# Patient Record
Sex: Female | Born: 1955 | Race: Black or African American | Hispanic: No | Marital: Single | Smoking: Former smoker
Health system: Southern US, Community
[De-identification: ages and names within clinical notes are randomized; demographics above are authoritative.]

## PROBLEM LIST (undated history)

## (undated) DIAGNOSIS — I5022 Chronic systolic (congestive) heart failure: Secondary | ICD-10-CM

## (undated) DIAGNOSIS — G629 Polyneuropathy, unspecified: Secondary | ICD-10-CM

## (undated) DIAGNOSIS — M545 Low back pain, unspecified: Secondary | ICD-10-CM

## (undated) DIAGNOSIS — I10 Essential (primary) hypertension: Secondary | ICD-10-CM

## (undated) DIAGNOSIS — E559 Vitamin D deficiency, unspecified: Secondary | ICD-10-CM

## (undated) DIAGNOSIS — M21079 Valgus deformity, not elsewhere classified, unspecified ankle: Secondary | ICD-10-CM

## (undated) DIAGNOSIS — M1030 Gout due to renal impairment, unspecified site: Secondary | ICD-10-CM

## (undated) DIAGNOSIS — Z952 Presence of prosthetic heart valve: Secondary | ICD-10-CM

## (undated) DIAGNOSIS — N183 Chronic kidney disease, stage 3 unspecified: Secondary | ICD-10-CM

## (undated) DIAGNOSIS — I6523 Occlusion and stenosis of bilateral carotid arteries: Secondary | ICD-10-CM

## (undated) DIAGNOSIS — I272 Pulmonary hypertension, unspecified: Secondary | ICD-10-CM

## (undated) DIAGNOSIS — E118 Type 2 diabetes mellitus with unspecified complications: Secondary | ICD-10-CM

## (undated) DIAGNOSIS — F32A Depression, unspecified: Secondary | ICD-10-CM

## (undated) DIAGNOSIS — I071 Rheumatic tricuspid insufficiency: Secondary | ICD-10-CM

## (undated) DIAGNOSIS — E871 Hypo-osmolality and hyponatremia: Secondary | ICD-10-CM

## (undated) DIAGNOSIS — M810 Age-related osteoporosis without current pathological fracture: Secondary | ICD-10-CM

## (undated) DIAGNOSIS — E782 Mixed hyperlipidemia: Secondary | ICD-10-CM

## (undated) DIAGNOSIS — G4700 Insomnia, unspecified: Secondary | ICD-10-CM

## (undated) DIAGNOSIS — N289 Disorder of kidney and ureter, unspecified: Secondary | ICD-10-CM

## (undated) DIAGNOSIS — Z860100 Personal history of colon polyps, unspecified: Secondary | ICD-10-CM

## (undated) DIAGNOSIS — I739 Peripheral vascular disease, unspecified: Secondary | ICD-10-CM

## (undated) DIAGNOSIS — M109 Gout, unspecified: Secondary | ICD-10-CM

## (undated) DIAGNOSIS — H8110 Benign paroxysmal vertigo, unspecified ear: Secondary | ICD-10-CM

## (undated) DIAGNOSIS — Z760 Encounter for issue of repeat prescription: Secondary | ICD-10-CM

## (undated) DIAGNOSIS — F329 Major depressive disorder, single episode, unspecified: Secondary | ICD-10-CM

## (undated) DIAGNOSIS — E1165 Type 2 diabetes mellitus with hyperglycemia: Secondary | ICD-10-CM

## (undated) DIAGNOSIS — R0683 Snoring: Secondary | ICD-10-CM

## (undated) DIAGNOSIS — G8929 Other chronic pain: Secondary | ICD-10-CM

## (undated) DIAGNOSIS — K219 Gastro-esophageal reflux disease without esophagitis: Secondary | ICD-10-CM

## (undated) DIAGNOSIS — Z95810 Presence of automatic (implantable) cardiac defibrillator: Secondary | ICD-10-CM

## (undated) DIAGNOSIS — E669 Obesity, unspecified: Secondary | ICD-10-CM

## (undated) DIAGNOSIS — K5281 Eosinophilic gastritis or gastroenteritis: Secondary | ICD-10-CM

## (undated) DIAGNOSIS — IMO0002 Reserved for concepts with insufficient information to code with codable children: Secondary | ICD-10-CM

## (undated) DIAGNOSIS — I509 Heart failure, unspecified: Secondary | ICD-10-CM

## (undated) DIAGNOSIS — F419 Anxiety disorder, unspecified: Secondary | ICD-10-CM

## (undated) DIAGNOSIS — M1990 Unspecified osteoarthritis, unspecified site: Secondary | ICD-10-CM

## (undated) DIAGNOSIS — D649 Anemia, unspecified: Secondary | ICD-10-CM

## (undated) HISTORY — DX: Disorder of kidney and ureter, unspecified: N28.9

## (undated) HISTORY — DX: Peripheral vascular disease, unspecified: I73.9

## (undated) HISTORY — DX: Age-related osteoporosis without current pathological fracture: M81.0

## (undated) HISTORY — DX: Gout, unspecified: M10.9

## (undated) HISTORY — PX: ABDOMINAL HYSTERECTOMY: SHX81

## (undated) HISTORY — DX: Hypo-osmolality and hyponatremia: E87.1

## (undated) HISTORY — DX: Chronic systolic (congestive) heart failure: I50.22

## (undated) HISTORY — DX: Rheumatic tricuspid insufficiency: I07.1

## (undated) HISTORY — DX: Obesity, unspecified: E66.9

## (undated) HISTORY — DX: Encounter for issue of repeat prescription: Z76.0

## (undated) HISTORY — DX: Occlusion and stenosis of bilateral carotid arteries: I65.23

## (undated) HISTORY — DX: Gout due to renal impairment, unspecified site: M10.30

## (undated) HISTORY — DX: Presence of automatic (implantable) cardiac defibrillator: Z95.810

## (undated) HISTORY — DX: Depression, unspecified: F32.A

## (undated) HISTORY — DX: Pulmonary hypertension, unspecified: I27.20

## (undated) HISTORY — DX: Chronic kidney disease, stage 3 (moderate): N18.3

## (undated) HISTORY — DX: Hypocalcemia: E83.51

## (undated) HISTORY — DX: Mixed hyperlipidemia: E78.2

## (undated) HISTORY — DX: Presence of prosthetic heart valve: Z95.2

## (undated) HISTORY — DX: Gastro-esophageal reflux disease without esophagitis: K21.9

## (undated) HISTORY — DX: Chronic kidney disease, stage 3 unspecified: N18.30

## (undated) HISTORY — DX: Major depressive disorder, single episode, unspecified: F32.9

## (undated) HISTORY — DX: Type 2 diabetes mellitus with hyperglycemia: E11.65

## (undated) HISTORY — DX: Low back pain: M54.5

## (undated) HISTORY — DX: Polyneuropathy, unspecified: G62.9

## (undated) HISTORY — DX: Benign paroxysmal vertigo, unspecified ear: H81.10

## (undated) HISTORY — DX: Eosinophilic gastritis or gastroenteritis: K52.81

## (undated) HISTORY — DX: Low back pain, unspecified: M54.50

## (undated) HISTORY — DX: Valgus deformity, not elsewhere classified, unspecified ankle: M21.079

## (undated) HISTORY — PX: BACK SURGERY: SHX140

## (undated) HISTORY — DX: Type 2 diabetes mellitus with unspecified complications: E11.8

## (undated) HISTORY — DX: Vitamin D deficiency, unspecified: E55.9

## (undated) HISTORY — PX: CARDIAC VALVE REPLACEMENT: SHX585

## (undated) HISTORY — DX: Personal history of colonic polyps: Z86.010

## (undated) HISTORY — DX: Insomnia, unspecified: G47.00

## (undated) HISTORY — DX: Snoring: R06.83

## (undated) HISTORY — DX: Other chronic pain: G89.29

## (undated) HISTORY — DX: Reserved for concepts with insufficient information to code with codable children: IMO0002

## (undated) HISTORY — DX: Personal history of colon polyps, unspecified: Z86.0100

---

## 2011-02-04 ENCOUNTER — Emergency Department (INDEPENDENT_AMBULATORY_CARE_PROVIDER_SITE_OTHER): Payer: Medicaid Other

## 2011-02-04 ENCOUNTER — Other Ambulatory Visit: Payer: Self-pay

## 2011-02-04 ENCOUNTER — Encounter: Payer: Self-pay | Admitting: Emergency Medicine

## 2011-02-04 ENCOUNTER — Emergency Department (HOSPITAL_BASED_OUTPATIENT_CLINIC_OR_DEPARTMENT_OTHER)
Admission: EM | Admit: 2011-02-04 | Discharge: 2011-02-04 | Disposition: A | Discharge status: Home / Self Care | Payer: Medicaid Other | Attending: Emergency Medicine | Admitting: Emergency Medicine

## 2011-02-04 DIAGNOSIS — I10 Essential (primary) hypertension: Secondary | ICD-10-CM | POA: Insufficient documentation

## 2011-02-04 DIAGNOSIS — R0602 Shortness of breath: Secondary | ICD-10-CM | POA: Insufficient documentation

## 2011-02-04 DIAGNOSIS — R05 Cough: Secondary | ICD-10-CM

## 2011-02-04 DIAGNOSIS — R509 Fever, unspecified: Secondary | ICD-10-CM

## 2011-02-04 DIAGNOSIS — I517 Cardiomegaly: Secondary | ICD-10-CM

## 2011-02-04 DIAGNOSIS — J40 Bronchitis, not specified as acute or chronic: Secondary | ICD-10-CM

## 2011-02-04 DIAGNOSIS — E119 Type 2 diabetes mellitus without complications: Secondary | ICD-10-CM | POA: Insufficient documentation

## 2011-02-04 DIAGNOSIS — Z79899 Other long term (current) drug therapy: Secondary | ICD-10-CM | POA: Insufficient documentation

## 2011-02-04 DIAGNOSIS — R059 Cough, unspecified: Secondary | ICD-10-CM

## 2011-02-04 HISTORY — DX: Essential (primary) hypertension: I10

## 2011-02-04 LAB — DIFFERENTIAL
Basophils Absolute: 0 10*3/uL (ref 0.0–0.1)
Basophils Relative: 1 % (ref 0–1)
Eosinophils Absolute: 0.4 10*3/uL (ref 0.0–0.7)
Monocytes Relative: 10 % (ref 3–12)
Neutro Abs: 2.9 10*3/uL (ref 1.7–7.7)
Neutrophils Relative %: 46 % (ref 43–77)

## 2011-02-04 LAB — CK TOTAL AND CKMB (NOT AT ARMC)
CK, MB: 3.1 ng/mL (ref 0.3–4.0)
Relative Index: 3 — ABNORMAL HIGH (ref 0.0–2.5)

## 2011-02-04 LAB — CBC
MCH: 30.8 pg (ref 26.0–34.0)
MCHC: 33 g/dL (ref 30.0–36.0)
Platelets: 277 10*3/uL (ref 150–400)
RDW: 13.1 % (ref 11.5–15.5)

## 2011-02-04 LAB — PRO B NATRIURETIC PEPTIDE: Pro B Natriuretic peptide (BNP): 219.4 pg/mL — ABNORMAL HIGH (ref 0–125)

## 2011-02-04 MED ORDER — AZITHROMYCIN 250 MG PO TABS: 250.0000 mg | ORAL_TABLET | Freq: Every day | ORAL | Status: AC

## 2011-02-04 MED ORDER — ALBUTEROL SULFATE (5 MG/ML) 0.5% IN NEBU
INHALATION_SOLUTION | RESPIRATORY_TRACT | Status: AC
  Filled 2011-02-04: qty 0.5

## 2011-02-04 MED ORDER — ALBUTEROL SULFATE (5 MG/ML) 0.5% IN NEBU
2.5000 mg | INHALATION_SOLUTION | Freq: Once | RESPIRATORY_TRACT | Status: AC
  Administered 2011-02-04: 2.5 mg via RESPIRATORY_TRACT
  Filled 2011-02-04: qty 0.5

## 2011-02-04 MED ORDER — SODIUM CHLORIDE 0.9 % IV SOLN: Freq: Once | INTRAVENOUS | Status: DC

## 2011-02-04 MED ORDER — KETOROLAC TROMETHAMINE 30 MG/ML IJ SOLN
30.0000 mg | Freq: Once | INTRAMUSCULAR | Status: AC
  Administered 2011-02-04: 30 mg via INTRAVENOUS
  Filled 2011-02-04: qty 1

## 2011-02-04 NOTE — ED Notes (Signed)
Care plan and follow up reviewed

## 2011-02-04 NOTE — ED Provider Notes (Signed)
History     CSN: [id number] Arrival date & time: 02/04/2011  8:58 AM   First MD Initiated Contact with Patient 02/04/11 [phone]      Chief Complaint  Patient presents with   Nasal Congestion    cough cold congestion with yellow sputum    (Consider location/radiation/quality/duration/timing/severity/associated sxs/prior treatment) HPI Comments: Patient states that she has been having shortness of breath, productive cough, fatigue for the past several weeks.  She has already been on two antibiotics but not improving.  Denies fevers or chills.    Patient is a 55 y.o. female presenting with shortness of breath. The history is provided by the patient.  Shortness of Breath  The current episode started more than 2 weeks ago. The problem occurs continuously. The problem has been gradually worsening. The problem is moderate. The symptoms are relieved by nothing. The symptoms are aggravated by nothing. Associated symptoms include cough and shortness of breath. Pertinent negatives include no fever. Her past medical history does not include asthma.    Past Medical History  Diagnosis Date   Hypertension    Diabetes mellitus     Past Surgical History  Procedure Date   Cardiac valve replacement    Back surgery    Abdominal hysterectomy     History reviewed. No pertinent family history.  History  Substance Use Topics   Smoking status: Never Smoker    Smokeless tobacco: Not on file   Alcohol Use: No    OB History    Grav Para Term Preterm Abortions TAB SAB Ect Mult Living                  Review of Systems  Constitutional: Positive for fatigue. Negative for fever and chills.  HENT: Negative for neck pain and neck stiffness.   Respiratory: Positive for cough and shortness of breath.   Cardiovascular: Negative for palpitations and leg swelling.  Musculoskeletal: Negative for back pain.  All other systems reviewed and are negative.    Allergies  Review of patient's  allergies indicates no known allergies.  Home Medications   Current Outpatient Rx  Name Route Sig Dispense Refill   ALBUTEROL SULFATE (2.5 MG/3ML) 0.083% IN NEBU Nebulization Take 2.5 mg by nebulization every 6 (six) hours as needed.       AMLODIPINE BESY-BENAZEPRIL HCL 10-20 MG PO CAPS Oral Take 1 capsule by mouth daily.       CARVEDILOL 25 MG PO TABS Oral Take 25 mg by mouth 2 (two) times daily with a meal.       ESTROGENS CONJUGATED 1.25 MG PO TABS Oral Take 1.25 mg by mouth daily.       FENOFIBRATE 145 MG PO TABS Oral Take 145 mg by mouth daily.       FUROSEMIDE 40 MG PO TABS Oral Take 40 mg by mouth 2 (two) times daily.       GABAPENTIN 800 MG PO TABS Oral Take 800 mg by mouth 3 (three) times daily.       INSULIN ISOPHANE HUMAN 100 UNIT/ML [place] SUSP Subcutaneous Inject 70 Units into the skin 2 (two) times daily.       POTASSIUM & SODIUM PHOSPHATES 280-160-250 MG PO PACK Oral Take 1 packet by mouth 4 (four) times daily -  with meals and at bedtime.       SITAGLIPTIN PHOSPHATE 100 MG PO TABS Oral Take 100 mg by mouth daily.       WARFARIN SODIUM 6 MG PO TABS Oral  Take 6 mg by mouth daily.        BP 159/79   Pulse 99   Resp 22   Wt 260 lb (117.935 kg)   SpO2 97%  Physical Exam  Nursing note and vitals reviewed. Constitutional: She is oriented to person, place, and time. She appears well-developed and well-nourished. No distress.  HENT:  Head: Normocephalic and atraumatic.  Neck: Normal range of motion. Neck supple.  Cardiovascular: Normal rate and regular rhythm.  Exam reveals no gallop and no friction rub.   No murmur heard. Pulmonary/Chest: Effort normal and breath sounds normal. No respiratory distress. She has no wheezes.  Abdominal: Soft. Bowel sounds are normal. She exhibits no distension. There is no tenderness.  Musculoskeletal: Normal range of motion.  Neurological: She is alert and oriented to person, place, and time.  Skin: Skin is warm and dry. She is not  diaphoretic.    ED Course  Procedures (including critical care time)   Labs Reviewed  CBC  DIFFERENTIAL  CK TOTAL AND CKMB  TROPONIN I  PRO B NATRIURETIC PEPTIDE   No results found.   No diagnosis found.   Date: 02/04/2011  Rate: 93  Rhythm: normal sinus rhythm  QRS Axis: left  Intervals: normal  ST/T Wave abnormalities: normal  Conduction Disutrbances:nonspecific intraventricular conduction delay  Narrative Interpretation:   Old EKG Reviewed: none available    MDM  Cardiac workup looks okay.  CXR shows bronchial thickening.  No hypoxia.  Will treat with antibiotics, follow up with pcp.          Geoffery Lyons, MD 02/04/11 1116

## 2011-02-04 NOTE — ED Notes (Signed)
Pt reports cough cold and congestion unresponsive to Antibiotic therapy

## 2011-02-08 NOTE — Procedures (Signed)
°

## 2011-02-14 ENCOUNTER — Emergency Department (HOSPITAL_BASED_OUTPATIENT_CLINIC_OR_DEPARTMENT_OTHER)
Admission: EM | Admit: 2011-02-14 | Discharge: 2011-02-14 | Disposition: A | Discharge status: Home / Self Care | Payer: Medicaid Other | Attending: Emergency Medicine | Admitting: Emergency Medicine

## 2011-02-14 ENCOUNTER — Emergency Department (INDEPENDENT_AMBULATORY_CARE_PROVIDER_SITE_OTHER): Payer: Medicaid Other

## 2011-02-14 ENCOUNTER — Encounter (HOSPITAL_BASED_OUTPATIENT_CLINIC_OR_DEPARTMENT_OTHER): Payer: Self-pay | Admitting: *Deleted

## 2011-02-14 DIAGNOSIS — W1809XA Striking against other object with subsequent fall, initial encounter: Secondary | ICD-10-CM

## 2011-02-14 DIAGNOSIS — Y92009 Unspecified place in unspecified non-institutional (private) residence as the place of occurrence of the external cause: Secondary | ICD-10-CM | POA: Insufficient documentation

## 2011-02-14 DIAGNOSIS — I10 Essential (primary) hypertension: Secondary | ICD-10-CM | POA: Insufficient documentation

## 2011-02-14 DIAGNOSIS — R42 Dizziness and giddiness: Secondary | ICD-10-CM

## 2011-02-14 DIAGNOSIS — M25519 Pain in unspecified shoulder: Secondary | ICD-10-CM

## 2011-02-14 DIAGNOSIS — M542 Cervicalgia: Secondary | ICD-10-CM

## 2011-02-14 DIAGNOSIS — Z79899 Other long term (current) drug therapy: Secondary | ICD-10-CM | POA: Insufficient documentation

## 2011-02-14 DIAGNOSIS — S0990XA Unspecified injury of head, initial encounter: Secondary | ICD-10-CM

## 2011-02-14 DIAGNOSIS — W1811XA Fall from or off toilet without subsequent striking against object, initial encounter: Secondary | ICD-10-CM | POA: Insufficient documentation

## 2011-02-14 DIAGNOSIS — E119 Type 2 diabetes mellitus without complications: Secondary | ICD-10-CM | POA: Insufficient documentation

## 2011-02-14 HISTORY — DX: Unspecified osteoarthritis, unspecified site: M19.90

## 2011-02-14 LAB — PROTIME-INR: Prothrombin Time: 31.9 seconds — ABNORMAL HIGH (ref 11.6–15.2)

## 2011-02-14 MED ORDER — OXYCODONE-ACETAMINOPHEN 7.5-325 MG PO TABS: 1.0000 | ORAL_TABLET | ORAL | Status: AC | PRN

## 2011-02-14 NOTE — ED Provider Notes (Signed)
I saw and evaluated the patient, reviewed the resident's note and I agree with the findings and plan.   .Face to face Exam:  General:  Awake HEENT:  Atraumatic Resp:  Normal effort Abd:  Nondistended Neuro:No focal weakness Lymph: No adenopathy   Nelia Shi, MD 02/14/11 1504

## 2011-02-14 NOTE — ED Notes (Signed)
Pt amb to room 7 with quick steady gait in nad. Pt reports falling asleep on the toilet Sunday and hit her head on the bathub. Denies any loc, denies feeling dizzy prior, states "I just fell asleep sitting there..." pt denies any n/v, or other c/o. Cc is lateral neck and bilateral shoulder pain.

## 2011-02-14 NOTE — ED Provider Notes (Signed)
History     CSN: [id number] Arrival date & time: 02/14/2011  8:28 AM   First MD Initiated Contact with Patient 02/14/11 [phone]      No chief complaint on file.   (Consider location/radiation/quality/duration/timing/severity/associated sxs/prior treatment) HPI 55 year old woman with a history of aortic valve replacement on Coumadin, diabetes, and hypertension, last in the ED on 11/26 for shortness of breath and cough who presents today after hitting her head on the bathtub. Patient reports that on Sunday (4 days ago) she was on the toilet and fell asleep, and slouched off, hitting her head on the bathtub. The patient now complains of frontal headache, posterior neck pain, and bilateral shoulder pain. Her left shoulder hurts worse than her right, and she did fall onto her left side. She denies loss of consciousness, numbness or tingling in her limbs, changes in vision, chest pain, palpitations, abdominal pain, or dysuria.  Past Medical History  Diagnosis Date   Hypertension    Diabetes mellitus     Past Surgical History  Procedure Date   Cardiac valve replacement    Back surgery    Abdominal hysterectomy     No family history on file.  History  Substance Use Topics   Smoking status: Never Smoker    Smokeless tobacco: Not on file   Alcohol Use: No    OB History    Grav Para Term Preterm Abortions TAB SAB Ect Mult Living                  Review of Systems As per HPI  Allergies  Review of patient's allergies indicates no known allergies.  Home Medications   Current Outpatient Rx  Name Route Sig Dispense Refill   ALBUTEROL SULFATE (2.5 MG/3ML) 0.083% IN NEBU Nebulization Take 2.5 mg by nebulization every 6 (six) hours as needed.       AMLODIPINE BESY-BENAZEPRIL HCL 10-20 MG PO CAPS Oral Take 1 capsule by mouth daily.       CARVEDILOL 25 MG PO TABS Oral Take 25 mg by mouth 2 (two) times daily with a meal.       ESTROGENS CONJUGATED 1.25 MG PO TABS Oral Take  1.25 mg by mouth daily.       FENOFIBRATE 145 MG PO TABS Oral Take 145 mg by mouth daily.       FUROSEMIDE 40 MG PO TABS Oral Take 40 mg by mouth 2 (two) times daily.       GABAPENTIN 800 MG PO TABS Oral Take 800 mg by mouth 3 (three) times daily.       INSULIN ISOPHANE HUMAN 100 UNIT/ML [place] SUSP Subcutaneous Inject 70 Units into the skin 2 (two) times daily.       POTASSIUM & SODIUM PHOSPHATES 280-160-250 MG PO PACK Oral Take 1 packet by mouth 4 (four) times daily -  with meals and at bedtime.       SITAGLIPTIN PHOSPHATE 100 MG PO TABS Oral Take 100 mg by mouth daily.       WARFARIN SODIUM 6 MG PO TABS Oral Take 6 mg by mouth daily.        BP 156/79   Pulse 86   Temp(Src) 98.3 F (36.8 C) (Oral)   Resp 16   SpO2 98%  Physical Exam VItal signs reviewed and stable. GEN: No apparent distress.  Alert and oriented x 3.  Pleasant, conversant, and cooperative to exam. HEENT: [place], healing 4mm superficial laceration over the bridge of the nose.  Neck  is TTP midline over lower C-spine, no palpable stepoffs or abnormalities.  EOMI.  PERRLA.  Sclerae anicteric.   Mucous membranes are moist.  Oropharynx is without erythema, exudates, or other abnormal lesions. RESP:  Lungs are clear to ascultation bilaterally with good air movement.  No wheezes, ronchi, or rubs. CARDIOVASCULAR: regular rate, normal rhythm.  Clear S1, S2, 3/6 hsm over upper sternal border ABDOMEN: obese, NT EXT: warm and dry.  1+ edema in b/l lower extremeties to mid calf. SKIN: warm and dry with normal turgor. NEURO: CN II-XII grossly intact.  Muscle strength +5/5 in bilateral upper and lower extremities.  Sensation is grossly intact.  No focal deficit.  ED Course  Procedures (including critical care time)   Labs Reviewed  PROTIME-INR   No results found.   No diagnosis found.    MDM  55 year old woman status post fall onto left face and shoulder. Patient does not give history consistent with syncope.  Neuro exam is  nonfocal, but the patient says that the pain is not resolving, and that is why she came to the ED today. In the setting of Coumadin use, will proceed with head and neck CT as well as shoulder x-ray.  10:46 AM CT scan of the head is negative for acute bleed, CT scan of the neck shows no acute pathology. Plain film of the left shoulder also unremarkable. Patient's INR was 3.03, I instructed her to decrease 1 days Coumadin from 7 mg to 5 mg. She'll be discharged with some pain medicine and followup with her primary doctor.      Kathreen Cosier, MD 02/14/11 1047

## 2011-02-25 ENCOUNTER — Emergency Department (HOSPITAL_BASED_OUTPATIENT_CLINIC_OR_DEPARTMENT_OTHER)
Admission: EM | Admit: 2011-02-25 | Discharge: 2011-02-26 | Disposition: A | Discharge status: Home / Self Care | Payer: Medicaid Other | Attending: Emergency Medicine | Admitting: Emergency Medicine

## 2011-02-25 ENCOUNTER — Encounter (HOSPITAL_BASED_OUTPATIENT_CLINIC_OR_DEPARTMENT_OTHER): Payer: Self-pay | Admitting: *Deleted

## 2011-02-25 DIAGNOSIS — Z79899 Other long term (current) drug therapy: Secondary | ICD-10-CM | POA: Insufficient documentation

## 2011-02-25 DIAGNOSIS — L0291 Cutaneous abscess, unspecified: Secondary | ICD-10-CM

## 2011-02-25 DIAGNOSIS — L02219 Cutaneous abscess of trunk, unspecified: Secondary | ICD-10-CM | POA: Insufficient documentation

## 2011-02-25 MED ORDER — LIDOCAINE-EPINEPHRINE 2 %-1:100000 IJ SOLN
INTRAMUSCULAR | Status: AC
  Filled 2011-02-25: qty 1

## 2011-02-25 MED ORDER — CLINDAMYCIN HCL 150 MG PO CAPS: 150.0000 mg | ORAL_CAPSULE | Freq: Four times a day (QID) | ORAL | Status: AC

## 2011-02-25 MED ORDER — HYDROCODONE-ACETAMINOPHEN 5-500 MG PO TABS: 1.0000 | ORAL_TABLET | Freq: Four times a day (QID) | ORAL | Status: AC | PRN

## 2011-02-25 NOTE — ED Provider Notes (Signed)
History     CSN: [id number] Arrival date & time: 02/25/2011 11:10 PM   First MD Initiated Contact with Patient 02/25/11 2339      Chief Complaint  Patient presents with   Abscess    (Consider location/radiation/quality/duration/timing/severity/associated sxs/prior treatment) HPI Comments: Pt states that the ara busted and now the area seems to be getting more swollen  Patient is a 55 y.o. female presenting with abscess. The history is provided by the patient. No language interpreter was used.  Abscess  This is a new problem. The current episode started less than one week ago. The onset was sudden. The problem occurs continuously. The problem has been unchanged. The abscess is present on the groin. The problem is moderate. The abscess is characterized by draining and painfulness. It is unknown what she was exposed to. Her past medical history does not include skin abscesses in family. There were no sick contacts. She has received no recent medical care.    Past Medical History  Diagnosis Date   Hypertension    Diabetes mellitus    Arthritis     Past Surgical History  Procedure Date   Cardiac valve replacement    Back surgery    Abdominal hysterectomy     History reviewed. No pertinent family history.  History  Substance Use Topics   Smoking status: Never Smoker    Smokeless tobacco: Not on file   Alcohol Use: No    OB History    Grav Para Term Preterm Abortions TAB SAB Ect Mult Living                  Review of Systems  All other systems reviewed and are negative.    Allergies  Review of patient's allergies indicates no known allergies.  Home Medications   Current Outpatient Rx  Name Route Sig Dispense Refill   ACETAMINOPHEN 500 MG PO TABS Oral Take 500 mg by mouth every 6 (six) hours as needed. For pain      ALBUTEROL SULFATE HFA 108 (90 BASE) MCG/ACT IN AERS Inhalation Inhale 2 puffs into the lungs every 6 (six) hours as needed. For  wheezing       ALBUTEROL SULFATE (2.5 MG/3ML) 0.083% IN NEBU Nebulization Take 2.5 mg by nebulization every 6 (six) hours as needed. For wheezing      ALPRAZOLAM 0.5 MG PO TABS Oral Take 0.5 mg by mouth 2 (two) times daily as needed. For sleep or anxiety     AMLODIPINE BESY-BENAZEPRIL HCL 10-20 MG PO CAPS Oral Take 1 capsule by mouth daily.       CARVEDILOL 25 MG PO TABS Oral Take 25 mg by mouth 2 (two) times daily.      ESTROGENS CONJUGATED 1.25 MG PO TABS Oral Take 1.25 mg by mouth daily.       FENOFIBRATE 145 MG PO TABS Oral Take 145 mg by mouth daily.       FUROSEMIDE 40 MG PO TABS Oral Take 40 mg by mouth daily.      GABAPENTIN 800 MG PO TABS Oral Take 800 mg by mouth 3 (three) times daily.       INSULIN ISOPHANE & REGULAR (70-30) 100 UNIT/ML [place] SUSP Subcutaneous Inject 75 Units into the skin 2 (two) times daily.       NORTRIPTYLINE HCL 50 MG PO CAPS Oral Take 100 mg by mouth at bedtime.      OMEPRAZOLE 20 MG PO CPDR Oral Take 20 mg by mouth daily.  POTASSIUM CHLORIDE CRYS CR 20 MEQ PO TBCR Oral Take 20 mEq by mouth daily.       SERTRALINE HCL 100 MG PO TABS Oral Take 100 mg by mouth daily.       SITAGLIPTIN PHOSPHATE 100 MG PO TABS Oral Take 100 mg by mouth daily.       WARFARIN SODIUM 1 MG PO TABS Oral Take 1 mg by mouth every other day. Along with 5mg  to equal 6mg       WARFARIN SODIUM 1 MG PO TABS Oral Take 1 mg by mouth every other day. Along with 5mg  to equal 7mg       WARFARIN SODIUM 5 MG PO TABS Oral Take 5 mg by mouth daily. Along with 1mg  tablet to equal 6 or 7mg       OXYCODONE-ACETAMINOPHEN 7.5-325 MG PO TABS Oral Take 1 tablet by mouth every 4 (four) hours as needed for pain. 20 tablet 0    BP 136/80   Pulse 108   Temp(Src) 97.6 F (36.4 C) (Oral)   Resp 20   Ht 6' (1.829 m)   Wt 250 lb (113.399 kg)   BMI 33.91 kg/m2   SpO2 95%  Physical Exam  Nursing note and vitals reviewed. Constitutional: She is oriented to person, place, and time. She appears  well-developed and well-nourished.  Cardiovascular: Normal rate and regular rhythm.   Pulmonary/Chest: Effort normal and breath sounds normal.  Musculoskeletal: Normal range of motion.  Neurological: She is alert and oriented to person, place, and time.  Skin:       Pt has a draining abscess to the right groin:no labial involvement noted    ED Course  Procedures (including critical care time)  Labs Reviewed - No data to display No results found.   1. Abscess       MDM  Area is already draining at this time:no need for I&D at this time:will treat with antibiotics        Teressa Lower, NP 02/25/11 2352  Medical screening examination/treatment/procedure(s) were performed by non-physician practitioner and as supervising physician I was immediately available for consultation/collaboration.   Sunnie Nielsen, MD 02/26/11 [phone]

## 2011-02-25 NOTE — Consent Form (Signed)
°

## 2011-02-25 NOTE — ED Notes (Signed)
Pt states she has had a recurrent "boil" in her groin area. This last time 4-5 days ago. "Busted", but is now bigger.

## 2011-04-15 ENCOUNTER — Encounter (HOSPITAL_BASED_OUTPATIENT_CLINIC_OR_DEPARTMENT_OTHER): Payer: Self-pay | Admitting: *Deleted

## 2011-04-15 ENCOUNTER — Emergency Department (HOSPITAL_BASED_OUTPATIENT_CLINIC_OR_DEPARTMENT_OTHER)
Admission: EM | Admit: 2011-04-15 | Discharge: 2011-04-15 | Disposition: A | Discharge status: Home / Self Care | Payer: Medicaid Other | Attending: Emergency Medicine | Admitting: Emergency Medicine

## 2011-04-15 DIAGNOSIS — E7800 Pure hypercholesterolemia, unspecified: Secondary | ICD-10-CM | POA: Insufficient documentation

## 2011-04-15 DIAGNOSIS — Z79899 Other long term (current) drug therapy: Secondary | ICD-10-CM | POA: Insufficient documentation

## 2011-04-15 DIAGNOSIS — J020 Streptococcal pharyngitis: Secondary | ICD-10-CM | POA: Insufficient documentation

## 2011-04-15 DIAGNOSIS — E119 Type 2 diabetes mellitus without complications: Secondary | ICD-10-CM | POA: Insufficient documentation

## 2011-04-15 DIAGNOSIS — H9209 Otalgia, unspecified ear: Secondary | ICD-10-CM | POA: Insufficient documentation

## 2011-04-15 DIAGNOSIS — I10 Essential (primary) hypertension: Secondary | ICD-10-CM | POA: Insufficient documentation

## 2011-04-15 HISTORY — DX: Anxiety disorder, unspecified: F41.9

## 2011-04-15 LAB — GLUCOSE, CAPILLARY: Glucose-Capillary: 152 mg/dL — ABNORMAL HIGH (ref 70–99)

## 2011-04-15 MED ORDER — ACETAMINOPHEN 325 MG PO TABS
650.0000 mg | ORAL_TABLET | Freq: Once | ORAL | Status: AC
  Administered 2011-04-15: 650 mg via ORAL
  Filled 2011-04-15: qty 2

## 2011-04-15 MED ORDER — PENICILLIN G BENZATHINE 1200000 UNIT/2ML IM SUSP
1.2000 10*6.[IU] | Freq: Once | INTRAMUSCULAR | Status: AC
  Administered 2011-04-15: 1.2 10*6.[IU] via INTRAMUSCULAR
  Filled 2011-04-15: qty 2

## 2011-04-15 NOTE — ED Notes (Signed)
MD at bedside. Teressa Lower, NP in with pt.

## 2011-04-15 NOTE — ED Notes (Signed)
Pt reports sore throat, ear pain and front of neck sore x 1 day

## 2011-04-15 NOTE — ED Provider Notes (Signed)
History     CSN: [id number]  Arrival date & time 04/15/11  1636   First MD Initiated Contact with Patient 04/15/11 1751      Chief Complaint  Patient presents with   Sore Throat   Otalgia    (Consider location/radiation/quality/duration/timing/severity/associated sxs/prior treatment) Patient is a 56 y.o. female presenting with pharyngitis and ear pain. The history is provided by the patient. No language interpreter was used.  Sore Throat This is a new problem. The current episode started today. The problem occurs constantly. The problem has been unchanged. Associated symptoms include coughing and a sore throat. Pertinent negatives include no fever. The symptoms are aggravated by swallowing. She has tried nothing for the symptoms.  Otalgia Associated symptoms include sore throat and cough.    Past Medical History  Diagnosis Date   Hypertension    Diabetes mellitus    Arthritis    High cholesterol    Blood transfusion    Anxiety     Past Surgical History  Procedure Date   Cardiac valve replacement    Back surgery    Abdominal hysterectomy     No family history on file.  History  Substance Use Topics   Smoking status: Former Smoker   Smokeless tobacco: Never Used   Alcohol Use: No    OB History    Grav Para Term Preterm Abortions TAB SAB Ect Mult Living                  Review of Systems  Constitutional: Negative for fever.  HENT: Positive for ear pain and sore throat.   Respiratory: Positive for cough.     Allergies  Review of patient's allergies indicates no known allergies.  Home Medications   Current Outpatient Rx  Name Route Sig Dispense Refill   ACETAMINOPHEN 500 MG PO TABS Oral Take 1,000 mg by mouth every 6 (six) hours as needed. For pain     ALBUTEROL SULFATE HFA 108 (90 BASE) MCG/ACT IN AERS Inhalation Inhale 2 puffs into the lungs every 6 (six) hours as needed. For wheezing       ALPRAZOLAM 0.5 MG PO TABS Oral Take 0.5 mg  by mouth 2 (two) times daily as needed. For sleep or anxiety     AMLODIPINE BESY-BENAZEPRIL HCL 10-20 MG PO CAPS Oral Take 1 capsule by mouth daily.       CARVEDILOL 25 MG PO TABS Oral Take 25 mg by mouth 2 (two) times daily.      ESTROGENS CONJUGATED 1.25 MG PO TABS Oral Take 1.25 mg by mouth daily.       FUROSEMIDE 40 MG PO TABS Oral Take 40 mg by mouth daily.      GABAPENTIN 800 MG PO TABS Oral Take 800 mg by mouth 3 (three) times daily.       INSULIN ISOPHANE & REGULAR (70-30) 100 UNIT/ML [place] SUSP Subcutaneous Inject 75 Units into the skin 2 (two) times daily.       NORTRIPTYLINE HCL 50 MG PO CAPS Oral Take 100 mg by mouth at bedtime.      OMEPRAZOLE 20 MG PO CPDR Oral Take 20 mg by mouth daily.       POTASSIUM CHLORIDE CRYS ER 20 MEQ PO TBCR Oral Take 20 mEq by mouth daily.       SERTRALINE HCL 100 MG PO TABS Oral Take 100 mg by mouth daily.       SITAGLIPTIN PHOSPHATE 100 MG PO TABS Oral Take 100 mg  by mouth daily.       WARFARIN SODIUM 1 MG PO TABS Oral Take 1-2 mg by mouth daily. Take two tablets on Monday and Friday in combination with 5 mg tablet for a total of 7 mg. Remaining days of week, take 1 tablet for a total of 6 mg     WARFARIN SODIUM 5 MG PO TABS Oral Take 5 mg by mouth daily. Along with 1mg  tablet to equal 6 or 7mg       BP 159/87   Pulse 119   Temp(Src) 99.8 F (37.7 C) (Oral)   Resp 20   Ht 5\' 10"  (1.778 m)   Wt 260 lb (117.935 kg)   BMI 37.31 kg/m2   SpO2 96%  Physical Exam  Nursing note and vitals reviewed. Constitutional: She is oriented to person, place, and time. She appears well-developed and well-nourished.  HENT:  Head: Normocephalic and atraumatic.  Right Ear: External ear normal.  Left Ear: External ear normal.  Mouth/Throat: Posterior oropharyngeal edema and posterior oropharyngeal erythema present.  Eyes: Conjunctivae and EOM are normal.  Neck: Neck supple.  Cardiovascular: Normal rate and regular rhythm.   Pulmonary/Chest: Effort normal and  breath sounds normal.  Musculoskeletal: Normal range of motion.  Neurological: She is alert and oriented to person, place, and time.  Skin: Skin is warm and dry.  Psychiatric: She has a normal mood and affect.    ED Course  Procedures (including critical care time)  Labs Reviewed  RAPID STREP SCREEN - Abnormal; Notable for the following:    Streptococcus, Group A Screen (Direct) POSITIVE (*)    All other components within normal limits   No results found.   1. Strep pharyngitis       MDM  Pt treated for strep here       Teressa Lower, NP 04/15/11 1859

## 2011-04-15 NOTE — ED Provider Notes (Signed)
Medical screening examination/treatment/procedure(s) were performed by non-physician practitioner and as supervising physician I was immediately available for consultation/collaboration.  Raeford Razor, MD 04/15/11 2203

## 2011-04-15 NOTE — Consent Form (Signed)
°

## 2011-05-15 ENCOUNTER — Encounter (HOSPITAL_BASED_OUTPATIENT_CLINIC_OR_DEPARTMENT_OTHER): Payer: Self-pay | Admitting: *Deleted

## 2011-05-15 ENCOUNTER — Emergency Department (INDEPENDENT_AMBULATORY_CARE_PROVIDER_SITE_OTHER): Payer: Medicaid Other

## 2011-05-15 ENCOUNTER — Emergency Department (HOSPITAL_BASED_OUTPATIENT_CLINIC_OR_DEPARTMENT_OTHER)
Admission: EM | Admit: 2011-05-15 | Discharge: 2011-05-15 | Disposition: A | Discharge status: Home / Self Care | Payer: Medicaid Other | Attending: Emergency Medicine | Admitting: Emergency Medicine

## 2011-05-15 DIAGNOSIS — E119 Type 2 diabetes mellitus without complications: Secondary | ICD-10-CM | POA: Insufficient documentation

## 2011-05-15 DIAGNOSIS — R011 Cardiac murmur, unspecified: Secondary | ICD-10-CM | POA: Insufficient documentation

## 2011-05-15 DIAGNOSIS — M542 Cervicalgia: Secondary | ICD-10-CM

## 2011-05-15 DIAGNOSIS — M79609 Pain in unspecified limb: Secondary | ICD-10-CM

## 2011-05-15 DIAGNOSIS — M1990 Unspecified osteoarthritis, unspecified site: Secondary | ICD-10-CM

## 2011-05-15 DIAGNOSIS — I10 Essential (primary) hypertension: Secondary | ICD-10-CM | POA: Insufficient documentation

## 2011-05-15 DIAGNOSIS — W19XXXA Unspecified fall, initial encounter: Secondary | ICD-10-CM

## 2011-05-15 DIAGNOSIS — N289 Disorder of kidney and ureter, unspecified: Secondary | ICD-10-CM

## 2011-05-15 DIAGNOSIS — Z7901 Long term (current) use of anticoagulants: Secondary | ICD-10-CM | POA: Insufficient documentation

## 2011-05-15 DIAGNOSIS — F411 Generalized anxiety disorder: Secondary | ICD-10-CM | POA: Insufficient documentation

## 2011-05-15 DIAGNOSIS — Z79899 Other long term (current) drug therapy: Secondary | ICD-10-CM | POA: Insufficient documentation

## 2011-05-15 DIAGNOSIS — Z954 Presence of other heart-valve replacement: Secondary | ICD-10-CM | POA: Insufficient documentation

## 2011-05-15 DIAGNOSIS — E7800 Pure hypercholesterolemia, unspecified: Secondary | ICD-10-CM | POA: Insufficient documentation

## 2011-05-15 DIAGNOSIS — M549 Dorsalgia, unspecified: Secondary | ICD-10-CM | POA: Insufficient documentation

## 2011-05-15 DIAGNOSIS — Z794 Long term (current) use of insulin: Secondary | ICD-10-CM | POA: Insufficient documentation

## 2011-05-15 DIAGNOSIS — M129 Arthropathy, unspecified: Secondary | ICD-10-CM | POA: Insufficient documentation

## 2011-05-15 LAB — URINALYSIS, ROUTINE W REFLEX MICROSCOPIC
Bilirubin Urine: NEGATIVE
Hgb urine dipstick: NEGATIVE
Nitrite: NEGATIVE
Specific Gravity, Urine: 1.011 (ref 1.005–1.030)
Urobilinogen, UA: 0.2 mg/dL (ref 0.0–1.0)
pH: 5 (ref 5.0–8.0)

## 2011-05-15 LAB — URINE MICROSCOPIC-ADD ON

## 2011-05-15 LAB — BASIC METABOLIC PANEL
BUN: 33 mg/dL — ABNORMAL HIGH (ref 6–23)
Chloride: 101 mEq/L (ref 96–112)
GFR calc Af Amer: 22 mL/min — ABNORMAL LOW (ref >90)
GFR calc non Af Amer: 19 mL/min — ABNORMAL LOW (ref >90)
Glucose, Bld: 109 mg/dL — ABNORMAL HIGH (ref 70–99)
Potassium: 4.7 mEq/L (ref 3.5–5.1)
Sodium: 135 mEq/L (ref 135–145)

## 2011-05-15 MED ORDER — OXYCODONE-ACETAMINOPHEN 5-325 MG PO TABS
1.0000 | ORAL_TABLET | Freq: Once | ORAL | Status: AC
  Administered 2011-05-15: 1 via ORAL
  Filled 2011-05-15: qty 1

## 2011-05-15 MED ORDER — OXYCODONE-ACETAMINOPHEN 5-325 MG PO TABS: 2.0000 | ORAL_TABLET | ORAL | Status: AC | PRN

## 2011-05-15 NOTE — ED Notes (Signed)
Pt. Reports pain all over and head and neck.   No reports of vomiting or diarrhea.

## 2011-05-15 NOTE — Discharge Instructions (Signed)
Arthritis, Nonspecific Arthritis is inflammation of a joint. This usually means pain, redness, warmth or swelling are present. One or more joints may be involved. There are a number of types of arthritis. Your caregiver may not be able to tell what type of arthritis you have right away. CAUSES  The most common cause of arthritis is the wear and tear on the joint (osteoarthritis). This causes damage to the cartilage, which can break down over time. The knees, hips, back and neck are most often affected by this type of arthritis. Other types of arthritis and common causes of joint pain include:  Sprains and other injuries near the joint. Sometimes minor sprains and injuries cause pain and swelling that develop hours later.   Rheumatoid arthritis. This affects hands, feet and knees. It usually affects both sides of your body at the same time. It is often associated with chronic ailments, fever, weight loss and general weakness.   Crystal arthritis. Gout and pseudo gout can cause occasional acute severe pain, redness and swelling in the foot, ankle, or knee.   Infectious arthritis. Bacteria can get into a joint through a break in overlying skin. This can cause infection of the joint. Bacteria and viruses can also spread through the blood and affect your joints.   Drug, infectious and allergy reactions. Sometimes joints can become mildly painful and slightly swollen with these types of illnesses.  SYMPTOMS   Pain is the main symptom.   Your joint or joints can also be red, swollen and warm or hot to the touch.   You may have a fever with certain types of arthritis, or even feel overall ill.   The joint with arthritis will hurt with movement. Stiffness is present with some types of arthritis.  DIAGNOSIS  Your caregiver will suspect arthritis based on your description of your symptoms and on your exam. Testing may be needed to find the type of arthritis:  Blood and sometimes urine tests.    X-ray tests and sometimes CT or MRI scans.   Removal of fluid from the joint (arthrocentesis) is done to check for bacteria, crystals or other causes. Your caregiver (or a specialist) will numb the area over the joint with a local anesthetic, and use a needle to remove joint fluid for examination. This procedure is only minimally uncomfortable.   Even with these tests, your caregiver may not be able to tell what kind of arthritis you have. Consultation with a specialist (rheumatologist) may be helpful.  TREATMENT  Your caregiver will discuss with you treatment specific to your type of arthritis. If the specific type cannot be determined, then the following general recommendations may apply. Treatment of severe joint pain includes:  Rest.   Elevation.   Anti-inflammatory medication (for example, ibuprofen) may be prescribed. Avoiding activities that cause increased pain.   Only take over-the-counter or prescription medicines for pain and discomfort as recommended by your caregiver.   Cold packs over an inflamed joint may be used for 10 to 15 minutes every hour. Hot packs sometimes feel better, but do not use overnight. Do not use hot packs if you are diabetic without your caregiver's permission.   A cortisone shot into arthritic joints may help reduce pain and swelling.   Any acute arthritis that gets worse over the next 1 to 2 days needs to be looked at to be sure there is no joint infection.  Long-term arthritis treatment involves modifying activities and lifestyle to reduce joint stress jarring. This can  include weight loss. Also, exercise is needed to nourish the joint cartilage and remove waste. This helps keep the muscles around the joint strong. HOME CARE INSTRUCTIONS   Do not take aspirin to relieve pain if gout is suspected. This elevates uric acid levels.   Only take over-the-counter or prescription medicines for pain, discomfort or fever as directed by your caregiver.    Rest the joint as much as possible.   If your joint is swollen, keep it elevated.   Use crutches if the painful joint is in your leg.   Drinking plenty of fluids may help for certain types of arthritis.   Follow your caregiver's dietary instructions.   Try low-impact exercise such as:   Swimming.   Water aerobics.   Biking.   Walking.   Morning stiffness is often relieved by a warm shower.   Put your joints through regular range-of-motion.  SEEK MEDICAL CARE IF:   You do not feel better in 24 hours or are getting worse.   You have side effects to medications, or are not getting better with treatment.  SEEK IMMEDIATE MEDICAL CARE IF:   You have a fever.   You develop severe joint pain, swelling or redness.   Many joints are involved and become painful and swollen.   There is severe back pain and/or leg weakness.   You have loss of bowel or bladder control.  Document Released: 04/05/2004 Document Revised: 02/15/2011 Document Reviewed: 04/21/2008 [facility] Patient Information 2012 [place], [place].Back Exercises Back exercises help treat and prevent back injuries. The goal of back exercises is to increase the strength of your abdominal and back muscles and the flexibility of your back. These exercises should be started when you no longer have back pain. Back exercises include:  Pelvic Tilt. Lie on your back with your knees bent. Tilt your pelvis until the lower part of your back is against the floor. Hold this position 5 to 10 sec and repeat 5 to 10 times.   Knee to Chest. Pull first 1 knee up against your chest and hold for 20 to 30 seconds, repeat this with the other knee, and then both knees. This may be done with the other leg straight or bent, whichever feels better.   Sit-Ups or Curl-Ups. Bend your knees 90 degrees. Start with tilting your pelvis, and do a partial, slow sit-up, lifting your trunk only 30 to 45 degrees off the floor. Take at least 2 to 3 seconds for  each sit-up. Do not do sit-ups with your knees out straight. If partial sit-ups are difficult, simply do the above but with only tightening your abdominal muscles and holding it as directed.   Hip-Lift. Lie on your back with your knees flexed 90 degrees. Push down with your feet and shoulders as you raise your hips a couple inches off the floor; hold for 10 seconds, repeat 5 to 10 times.   Back arches. Lie on your stomach, propping yourself up on bent elbows. Slowly press on your hands, causing an arch in your low back. Repeat 3 to 5 times. Any initial stiffness and discomfort should lessen with repetition over time.   Shoulder-Lifts. Lie face down with arms beside your body. Keep hips and torso pressed to floor as you slowly lift your head and shoulders off the floor.  Do not overdo your exercises, especially in the beginning. Exercises may cause you some mild back discomfort which lasts for a few minutes; however, if the pain is more severe,  or lasts for more than 15 minutes, do not continue exercises until you see your caregiver. Improvement with exercise therapy for back problems is slow.  See your caregivers for assistance with developing a proper back exercise program. Document Released: 04/05/2004 Document Revised: 02/15/2011 Document Reviewed: 02/26/2005 [facility] Patient Information 2012 [place], [place].  Sciatica Sciatica is a weakness and/or changes in sensation (tingling, jolts, hot and cold, numbness) along the path the sciatic nerve travels. Irritation or damage to lumbar nerve roots is often also referred to as lumbar radiculopathy.  Lumbar radiculopathy (Sciatica) is the most common form of this problem. Radiculopathy can occur in any of the nerves coming out of the spinal cord. The problems caused depend on which nerves are involved. The sciatic nerve is the large nerve supplying the branches of nerves going from the hip to the toes. It often causes a numbness or weakness in the skin  and/or muscles that the sciatic nerve serves. It also may cause symptoms (problems) of pain, burning, tingling, or electric shock-like feelings in the path of this nerve. This usually comes from injury to the fibers that make up the sciatic nerve. Some of these symptoms are low back pain and/or unpleasant feelings in the following areas:  From the mid-buttock down the back of the leg to the back of the knee.   And/or the outside of the calf and top of the foot.   And/or behind the inner ankle to the sole of the foot.  CAUSES   Herniated or slipped disc. Discs are the little cushions between the bones in the back.   Pressure by the piriformis muscle in the buttock on the sciatic nerve (Piriformis Syndrome).   Misalignment of the bones in the lower back and buttocks (Sacroiliac Joint Derangement).   Narrowing of the spinal canal that puts pressure on or pinches the fibers that make up the sciatic nerve.   A slipped vertebra that is out of line with those above or beneath it.   Abnormality of the nervous system itself so that nerve fibers do not transmit signals properly, especially to feet and calves (neuropathy).   Tumor (this is rare).  Your caregiver can usually determine the cause of your sciatica and begin the treatment most likely to help you. TREATMENT  Taking over-the-counter painkillers, physical therapy, rest, exercise, spinal manipulation, and injections of anesthetics and/or steroids may be used. Surgery, acupuncture, and Yoga can also be effective. Mind over matter techniques, mental imagery, and changing factors such as your bed, chair, desk height, posture, and activities are other treatments that may be helpful. You and your caregiver can help determine what is best for you. With proper diagnosis, the cause of most sciatica can be identified and removed. Communication and cooperation between your caregiver and you is essential. If you are not successful immediately, do not be  discouraged. With time, a proper treatment can be found that will make you comfortable. HOME CARE INSTRUCTIONS   If the pain is coming from a problem in the back, applying ice to that area for 15 to 20 minutes, 3 to 4 times per day while awake, may be helpful. Put the ice in a plastic bag. Place a towel between the bag of ice and your skin.   You may exercise or perform your usual activities if these do not aggravate your pain, or as suggested by your caregiver.   Only take over-the-counter or prescription medicines for pain, discomfort, or fever as directed by your caregiver.  If your caregiver has given you a follow-up appointment, it is very important to keep that appointment. Not keeping the appointment could result in a chronic or permanent injury, pain, and disability. If there is any problem keeping the appointment, you must call back to this facility for assistance.  SEEK IMMEDIATE MEDICAL CARE IF:   You experience loss of control of bowel or bladder.   You have increasing weakness in the trunk, buttocks, or legs.   There is numbness in any areas from the hip down to the toes.   You have difficulty walking or keeping your balance.   You have any of the above, with fever or forceful vomiting.  Document Released: 02/20/2001 Document Revised: 02/15/2011 Document Reviewed: 10/10/2007 [facility] Patient Information 2012 [place], [place].

## 2011-05-15 NOTE — ED Provider Notes (Signed)
History     CSN: [id number]  Arrival date & time 05/15/11  1417   First MD Initiated Contact with Patient 05/15/11 1437      Chief Complaint  Patient presents with  . Back Pain    Neck and back pain per Pt. for 1 wk.  Pt. reports she has taken tylenol at home with no relief.   This is a pleasant, 56 year old female with multiple past medical problems that include hypertension, diabetes, arthritis. Also, has a history of cardiac valve replacement.  The patient's relates a history of chronic back and neck pain. States that she has a "pinched nerve" in her neck and her back. She has been having ongoing pain over the past week in her low back with some radiation to her legs. Also, has chronic pain in her neck. She states that she thought the commode. This morning and feels she may have exacerbated her neck pain. She's also had joint aches, which have been fairly chronic. She's had no fevers. No nausea, vomiting. No focal weakness. No bowel or bladder incontinence. No saddle anesthesia. She does state that her blood sugars and been in the range of 145 at home.  She's had no vomiting, no weakness. No abdominal or chest pain. No fevers. Denies any sore throat or headache.  She was recently treated for strep throat, but apparently finished her antibiotics. Her vitals were stable in triage. When asked. She feels that her pain is chronic and related to muscle strains. (Consider location/radiation/quality/duration/timing/severity/associated sxs/prior treatment) HPI  Past Medical History  Diagnosis Date  . Hypertension   . Diabetes mellitus   . Arthritis   . High cholesterol   . Blood transfusion   . Anxiety     Past Surgical History  Procedure Date  . Cardiac valve replacement   . Back surgery   . Abdominal hysterectomy     No family history on file.  History  Substance Use Topics  . Smoking status: Former Games developer  . Smokeless tobacco: Never Used  . Alcohol Use: No    OB History     Grav Para Term Preterm Abortions TAB SAB Ect Mult Living                  Review of Systems  All other systems reviewed and are negative.    Allergies  Review of patient's allergies indicates no known allergies.  Home Medications   Current Outpatient Rx  Name Route Sig Dispense Refill  . ACETAMINOPHEN 500 MG PO TABS Oral Take 1,000 mg by mouth every 6 (six) hours as needed. For pain    . ALBUTEROL SULFATE HFA 108 (90 BASE) MCG/ACT IN AERS Inhalation Inhale 2 puffs into the lungs every 6 (six) hours as needed. For wheezing      . ALPRAZOLAM 0.5 MG PO TABS Oral Take 0.5 mg by mouth 2 (two) times daily as needed. For sleep or anxiety    . AMLODIPINE BESY-BENAZEPRIL HCL 10-20 MG PO CAPS Oral Take 1 capsule by mouth daily.      Marland Kitchen CARVEDILOL 25 MG PO TABS Oral Take 25 mg by mouth 2 (two) times daily.     Marland Kitchen ESTROGENS CONJUGATED 1.25 MG PO TABS Oral Take 1.25 mg by mouth daily.      . FUROSEMIDE 40 MG PO TABS Oral Take 40 mg by mouth daily.     Marland Kitchen GABAPENTIN 800 MG PO TABS Oral Take 800 mg by mouth 3 (three) times daily.      Marland Kitchen  INSULIN ISOPHANE & REGULAR (70-30) 100 UNIT/ML [place] SUSP Subcutaneous Inject 75 Units into the skin 2 (two) times daily.      Marland Kitchen NORTRIPTYLINE HCL 50 MG PO CAPS Oral Take 100 mg by mouth at bedtime.     . OMEPRAZOLE 20 MG PO CPDR Oral Take 20 mg by mouth daily.      Marland Kitchen POTASSIUM CHLORIDE CRYS ER 20 MEQ PO TBCR Oral Take 20 mEq by mouth daily.      . SERTRALINE HCL 100 MG PO TABS Oral Take 100 mg by mouth daily.      Marland Kitchen SITAGLIPTIN PHOSPHATE 100 MG PO TABS Oral Take 100 mg by mouth daily.      . WARFARIN SODIUM 1 MG PO TABS Oral Take 1-2 mg by mouth daily. Take two tablets on Monday and Friday in combination with 5 mg tablet for a total of 7 mg. Remaining days of week, take 1 tablet for a total of 6 mg    . WARFARIN SODIUM 5 MG PO TABS Oral Take 5 mg by mouth daily. Along with 1mg  tablet to equal 6 or 7mg       BP 96/58  Pulse 80  Temp(Src) 98 F (36.7 C) (Oral)  Resp  18  Ht 5\' 10"  (1.778 m)  Wt 153 lb (69.4 kg)  BMI 21.95 kg/m2  SpO2 98%  Physical Exam  Nursing note and vitals reviewed. Constitutional: She is oriented to person, place, and time. She appears well-developed and well-nourished. No distress.  HENT:  Head: Normocephalic and atraumatic.  Right Ear: External ear normal.  Left Ear: External ear normal.  Nose: Nose normal.  Mouth/Throat: No oropharyngeal exudate.       No obvious trauma, contusion ecchymoses, swelling or step-off to the forehead. Mucous membranes slightly dry. There is no bleeding or exudates.  Eyes: Conjunctivae and EOM are normal. Pupils are equal, round, and reactive to light. Right eye exhibits no discharge. Left eye exhibits no discharge.  Neck: Neck supple. No JVD present. No tracheal deviation present. No thyromegaly present.       Muscular tenderness to the paracervical muscles. There is no significant spinal tenderness or step off. There is no stiffness. No meningismus.  Cardiovascular: Normal rate and regular rhythm.  Exam reveals no gallop and no friction rub.   Murmur heard.      Clinic style murmur, likely secondary to her valve replacement.  Pulmonary/Chest: Breath sounds normal. No respiratory distress. She has no wheezes. She has no rales. She exhibits no tenderness.  Abdominal: Soft. Bowel sounds are normal. She exhibits no distension and no mass. There is no tenderness. There is no rebound and no guarding.  Musculoskeletal: Normal range of motion. She exhibits tenderness. She exhibits no edema.       Mild tenderness to lower back into knees. There is no redness no swelling. No calf tenderness. No cellulitis. No crepitance. No rash. There is no obvious effusion appreciated.  Lymphadenopathy:    She has no cervical adenopathy.  Neurological: She is alert and oriented to person, place, and time. She has normal reflexes. She displays normal reflexes. No cranial nerve deficit. She exhibits normal muscle tone.  Coordination normal.       Negative Kernig's and Brudzinski sign.  Skin: Skin is warm and dry. No rash noted.  Psychiatric: She has a normal mood and affect.    ED Course  Procedures (including critical care time)   Labs Reviewed  BASIC METABOLIC PANEL  URINALYSIS, ROUTINE W  REFLEX MICROSCOPIC   No results found.   No diagnosis found.    MDM  Pt is seen and examined;  Initial history and physical completed.  Will follow.    Vital signs are normal. Patient is given pain medication as well as oral fluid trial. We'll check a UA, as well as basic metabolic panel. Disposition will be pending reassessment and lab review.      Kersten Salmons A. Patrica Duel, MD 05/15/11 1456

## 2011-05-20 LAB — URINE CULTURE

## 2011-05-20 NOTE — ED Notes (Signed)
Lab called positive urine culture. Chart taken to EDP for review

## 2011-05-21 NOTE — ED Notes (Addendum)
Zyvox 600 mg 1 po BID x 14 days #28 close follow up with PCP when done for repeat ua and culture written by Lyman Bishop.

## 2011-05-22 NOTE — ED Notes (Signed)
Patient informed of positive results after id'd x 2 and informed of need to notify partner to be treated.Rx called to  [facility] [phone]

## 2011-06-17 ENCOUNTER — Emergency Department (INDEPENDENT_AMBULATORY_CARE_PROVIDER_SITE_OTHER): Payer: Medicaid Other

## 2011-06-17 ENCOUNTER — Encounter (HOSPITAL_BASED_OUTPATIENT_CLINIC_OR_DEPARTMENT_OTHER): Payer: Self-pay | Admitting: *Deleted

## 2011-06-17 ENCOUNTER — Emergency Department (HOSPITAL_BASED_OUTPATIENT_CLINIC_OR_DEPARTMENT_OTHER)
Admission: EM | Admit: 2011-06-17 | Discharge: 2011-06-18 | Disposition: A | Discharge status: Home / Self Care | Payer: Medicaid Other | Attending: Emergency Medicine | Admitting: Emergency Medicine

## 2011-06-17 DIAGNOSIS — Z794 Long term (current) use of insulin: Secondary | ICD-10-CM | POA: Insufficient documentation

## 2011-06-17 DIAGNOSIS — E119 Type 2 diabetes mellitus without complications: Secondary | ICD-10-CM | POA: Insufficient documentation

## 2011-06-17 DIAGNOSIS — M542 Cervicalgia: Secondary | ICD-10-CM | POA: Insufficient documentation

## 2011-06-17 DIAGNOSIS — Z79899 Other long term (current) drug therapy: Secondary | ICD-10-CM | POA: Insufficient documentation

## 2011-06-17 DIAGNOSIS — S139XXA Sprain of joints and ligaments of unspecified parts of neck, initial encounter: Secondary | ICD-10-CM | POA: Insufficient documentation

## 2011-06-17 DIAGNOSIS — M129 Arthropathy, unspecified: Secondary | ICD-10-CM | POA: Insufficient documentation

## 2011-06-17 DIAGNOSIS — M549 Dorsalgia, unspecified: Secondary | ICD-10-CM

## 2011-06-17 DIAGNOSIS — S161XXA Strain of muscle, fascia and tendon at neck level, initial encounter: Secondary | ICD-10-CM

## 2011-06-17 DIAGNOSIS — M25519 Pain in unspecified shoulder: Secondary | ICD-10-CM | POA: Insufficient documentation

## 2011-06-17 DIAGNOSIS — I10 Essential (primary) hypertension: Secondary | ICD-10-CM | POA: Insufficient documentation

## 2011-06-17 DIAGNOSIS — Z7901 Long term (current) use of anticoagulants: Secondary | ICD-10-CM | POA: Insufficient documentation

## 2011-06-17 DIAGNOSIS — W010XXA Fall on same level from slipping, tripping and stumbling without subsequent striking against object, initial encounter: Secondary | ICD-10-CM | POA: Insufficient documentation

## 2011-06-17 DIAGNOSIS — K921 Melena: Secondary | ICD-10-CM | POA: Insufficient documentation

## 2011-06-17 DIAGNOSIS — E7800 Pure hypercholesterolemia, unspecified: Secondary | ICD-10-CM | POA: Insufficient documentation

## 2011-06-17 DIAGNOSIS — M545 Low back pain, unspecified: Secondary | ICD-10-CM | POA: Insufficient documentation

## 2011-06-17 MED ORDER — KETOROLAC TROMETHAMINE 60 MG/2ML IM SOLN
60.0000 mg | Freq: Once | INTRAMUSCULAR | Status: AC
  Administered 2011-06-17: 60 mg via INTRAMUSCULAR
  Filled 2011-06-17: qty 2

## 2011-06-17 MED ORDER — HYDROCODONE-ACETAMINOPHEN 5-325 MG PO TABS
2.0000 | ORAL_TABLET | Freq: Once | ORAL | Status: AC
  Administered 2011-06-17: 2 via ORAL
  Filled 2011-06-17: qty 2

## 2011-06-17 NOTE — ED Notes (Addendum)
Pt presents with multiple complaints including "hurting all over" and infection "down there" and left wrist weakness states that she was told she had an infection but wasn't told what kind also states that she has been on percocet 10 for 20 years and can not get anymore filled. and has been out x 3 weeks and was told to come to ED for pain meds

## 2011-06-17 NOTE — ED Provider Notes (Signed)
History   This chart was scribed for Geoffery Lyons, MD by Charolett Bumpers . The patient was seen in room MH06/MH06 and the patient's care was started at 11:16pm.   CSN: [id number]  Arrival date & time 06/17/11  2201   First MD Initiated Contact with Patient 06/17/11 2313      Chief Complaint  Patient presents with  . Muscle Pain    (Consider location/radiation/quality/duration/timing/severity/associated sxs/prior treatment) HPI Donna Hopkins is a 56 y.o. female who presents to the Emergency Department complaining of constant, moderate back, shoulder and neck pain for the past 3 days. Patient states that she lost her balance 3 days ago in the bathroom and fell down. Patient states that she landed on her head. Patient denies LOC. Patient denies incontience. Patient reports blood in her stool a couple of days ago. Patient reports a h/o a "pinched nerve" in her back and neck. Patient states that she has taken Tylenol and has tried to rest with minimal relief.   No PCP  Past Medical History  Diagnosis Date  . Hypertension   . Diabetes mellitus   . Arthritis   . High cholesterol   . Blood transfusion   . Anxiety     Past Surgical History  Procedure Date  . Cardiac valve replacement   . Back surgery   . Abdominal hysterectomy     History reviewed. No pertinent family history.  History  Substance Use Topics  . Smoking status: Former Games developer  . Smokeless tobacco: Never Used  . Alcohol Use: No    OB History    Grav Para Term Preterm Abortions TAB SAB Ect Mult Living                  Review of Systems A complete 10 system review of systems was obtained and all systems are negative except as noted in the HPI and PMH.   Allergies  Review of patient's allergies indicates no known allergies.  Home Medications   Current Outpatient Rx  Name Route Sig Dispense Refill  . ACETAMINOPHEN 500 MG PO TABS Oral Take 1,000 mg by mouth every 6 (six) hours as needed. For pain     . ALBUTEROL SULFATE HFA 108 (90 BASE) MCG/ACT IN AERS Inhalation Inhale 2 puffs into the lungs every 6 (six) hours as needed. For wheezing      . ALPRAZOLAM 0.5 MG PO TABS Oral Take 0.5 mg by mouth 2 (two) times daily as needed. For sleep or anxiety    . AMLODIPINE BESY-BENAZEPRIL HCL 10-20 MG PO CAPS Oral Take 1 capsule by mouth daily.      Marland Kitchen CARVEDILOL 25 MG PO TABS Oral Take 25 mg by mouth daily.     Marland Kitchen ESTROGENS CONJUGATED 1.25 MG PO TABS Oral Take 1.25 mg by mouth daily.      Marland Kitchen FERROUS SULFATE 325 (65 FE) MG PO TABS Oral Take 325 mg by mouth daily with breakfast.    . FUROSEMIDE 40 MG PO TABS Oral Take 40 mg by mouth daily.     Marland Kitchen GABAPENTIN 800 MG PO TABS Oral Take 800 mg by mouth 3 (three) times daily.      . INSULIN ISOPHANE & REGULAR (70-30) 100 UNIT/ML [place] SUSP Subcutaneous Inject 75 Units into the skin 2 (two) times daily.      Marland Kitchen NORTRIPTYLINE HCL 50 MG PO CAPS Oral Take 100 mg by mouth at bedtime.     . OMEPRAZOLE 20 MG PO CPDR Oral Take  20 mg by mouth daily.      Marland Kitchen POTASSIUM CHLORIDE CRYS ER 20 MEQ PO TBCR Oral Take 20 mEq by mouth daily.      Marland Kitchen PRESCRIPTION MEDICATION Oral Take 1 tablet by mouth daily. Kidney medication to help clear suds from urine    . SERTRALINE HCL 100 MG PO TABS Oral Take 100 mg by mouth daily.      Marland Kitchen SITAGLIPTIN PHOSPHATE 100 MG PO TABS Oral Take 100 mg by mouth daily.      . WARFARIN SODIUM 1 MG PO TABS Oral Take 5 mg by mouth once a week. Take 5 mg along with the 5 mg tablet to equal 10 mg on Wednesday    . WARFARIN SODIUM 5 MG PO TABS Oral Take 5 mg by mouth daily. (except on Wednesday take additional 5 mg to equal 10 mg)      BP 119/72  Pulse 87  Temp(Src) 98 F (36.7 C) (Oral)  Resp 18  Ht 5\' 4"  (1.626 m)  Wt 240 lb (108.863 kg)  BMI 41.20 kg/m2  SpO2 98%  Physical Exam  Nursing note and vitals reviewed. Constitutional: She is oriented to person, place, and time. She appears well-developed and well-nourished. No distress.  HENT:  Head:  Normocephalic and atraumatic.  Right Ear: External ear normal.  Left Ear: External ear normal.  Nose: Nose normal.  Eyes: Conjunctivae and EOM are normal. Pupils are equal, round, and reactive to light.  Neck: Normal range of motion. Neck supple. No tracheal deviation present.  Cardiovascular: Normal rate, regular rhythm and normal heart sounds.  Exam reveals no gallop and no friction rub.   No murmur heard. Pulmonary/Chest: Effort normal and breath sounds normal. No respiratory distress. She has no wheezes. She has no rales.  Abdominal: Soft. Bowel sounds are normal. She exhibits no distension. There is no tenderness.  Musculoskeletal: Normal range of motion. She exhibits no edema.       No bony tenderness, no step-offs. Tenderness of soft tissues of cervical and lumbar spine.   Neurological: She is alert and oriented to person, place, and time. She has normal reflexes. No cranial nerve deficit or sensory deficit. Coordination normal.       Strength 5/5 bilaterally.   Skin: Skin is warm and dry.  Psychiatric: She has a normal mood and affect. Her behavior is normal.    ED Course  Procedures (including critical care time)  DIAGNOSTIC STUDIES: Oxygen Saturation is 98% on room air, normal by my interpretation.    COORDINATION OF CARE:  2320: Discussed planned course of treatment with the patient, who is agreeable at this time.    Labs Reviewed - No data to display No results found.   No diagnosis found.    MDM  Xrays show chronic changes, no fractures.  Hopkins discharge with pain meds, time.  Return, follow up prn   I personally performed the services described in this documentation, which was scribed in my presence. The recorded information has been reviewed and considered.        Geoffery Lyons, MD 06/18/11 [phone]

## 2011-06-18 DIAGNOSIS — M5380 Other specified dorsopathies, site unspecified: Secondary | ICD-10-CM

## 2011-06-18 DIAGNOSIS — M542 Cervicalgia: Secondary | ICD-10-CM

## 2011-06-18 DIAGNOSIS — W19XXXA Unspecified fall, initial encounter: Secondary | ICD-10-CM

## 2011-06-18 DIAGNOSIS — M5126 Other intervertebral disc displacement, lumbar region: Secondary | ICD-10-CM

## 2011-06-18 DIAGNOSIS — M5137 Other intervertebral disc degeneration, lumbosacral region: Secondary | ICD-10-CM

## 2011-06-18 MED ORDER — OXYCODONE-ACETAMINOPHEN 5-325 MG PO TABS: 1.0000 | ORAL_TABLET | Freq: Four times a day (QID) | ORAL | Status: AC | PRN

## 2011-06-18 MED ORDER — HYDROCODONE-ACETAMINOPHEN 5-500 MG PO TABS: 1.0000 | ORAL_TABLET | Freq: Four times a day (QID) | ORAL | Status: DC | PRN

## 2011-06-18 NOTE — ED Notes (Signed)
rx x 1 for percocet given at d/c

## 2011-06-18 NOTE — Discharge Instructions (Signed)
Back Pain, Adult Low back pain is very common. About 1 in 5 people have back pain.The cause of low back pain is rarely dangerous. The pain often gets better over time.About half of people with a sudden onset of back pain feel better in just 2 weeks. About 8 in 10 people feel better by 6 weeks.  CAUSES Some common causes of back pain include:  Strain of the muscles or ligaments supporting the spine.   Wear and tear (degeneration) of the spinal discs.   Arthritis.   Direct injury to the back.  DIAGNOSIS Most of the time, the direct cause of low back pain is not known.However, back pain can be treated effectively even when the exact cause of the pain is unknown.Answering your caregiver's questions about your overall health and symptoms is one of the most accurate ways to make sure the cause of your pain is not dangerous. If your caregiver needs more information, he or she may order lab work or imaging tests (X-rays or MRIs).However, even if imaging tests show changes in your back, this usually does not require surgery. HOME CARE INSTRUCTIONS For many people, back pain returns.Since low back pain is rarely dangerous, it is often a condition that people can learn to manageon their own.   Remain active. It is stressful on the back to sit or stand in one place. Do not sit, drive, or stand in one place for more than 30 minutes at a time. Take short walks on level surfaces as soon as pain allows.Try to increase the length of time you walk each day.   Do not stay in bed.Resting more than 1 or 2 days can delay your recovery.   Do not avoid exercise or work.Your body is made to move.It is not dangerous to be active, even though your back may hurt.Your back will likely heal faster if you return to being active before your pain is gone.   Pay attention to your body when you bend and lift. Many people have less discomfortwhen lifting if they bend their knees, keep the load close to their  bodies,and avoid twisting. Often, the most comfortable positions are those that put less stress on your recovering back.   Find a comfortable position to sleep. Use a firm mattress and lie on your side with your knees slightly bent. If you lie on your back, put a pillow under your knees.   Only take over-the-counter or prescription medicines as directed by your caregiver. Over-the-counter medicines to reduce pain and inflammation are often the most helpful.Your caregiver may prescribe muscle relaxant drugs.These medicines help dull your pain so you can more quickly return to your normal activities and healthy exercise.   Put ice on the injured area.   Put ice in a plastic bag.   Place a towel between your skin and the bag.   Leave the ice on for 15 to 20 minutes, 3 to 4 times a day for the first 2 to 3 days. After that, ice and heat may be alternated to reduce pain and spasms.   Ask your caregiver about trying back exercises and gentle massage. This may be of some benefit.   Avoid feeling anxious or stressed.Stress increases muscle tension and can worsen back pain.It is important to recognize when you are anxious or stressed and learn ways to manage it.Exercise is a great option.  SEEK MEDICAL CARE IF:  You have pain that is not relieved with rest or medicine.   You have   pain that does not improve in 1 week.   You have new symptoms.   You are generally not feeling well.  SEEK IMMEDIATE MEDICAL CARE IF:   You have pain that radiates from your back into your legs.   You develop new bowel or bladder control problems.   You have unusual weakness or numbness in your arms or legs.   You develop nausea or vomiting.   You develop abdominal pain.   You feel faint.  Document Released: 02/26/2005 Document Revised: 02/15/2011 Document Reviewed: 07/17/2010 [employer] Patient Information 2012 [employer]. 

## 2011-06-18 NOTE — ED Notes (Signed)
Delo MD at bedside. 

## 2011-07-09 ENCOUNTER — Encounter: Payer: Self-pay | Admitting: Physical Medicine & Rehabilitation

## 2011-07-12 ENCOUNTER — Encounter (HOSPITAL_BASED_OUTPATIENT_CLINIC_OR_DEPARTMENT_OTHER): Payer: Self-pay | Admitting: Family Medicine

## 2011-07-12 ENCOUNTER — Emergency Department (HOSPITAL_BASED_OUTPATIENT_CLINIC_OR_DEPARTMENT_OTHER)
Admission: EM | Admit: 2011-07-12 | Discharge: 2011-07-12 | Disposition: A | Discharge status: Home / Self Care | Payer: Medicaid Other | Attending: Emergency Medicine | Admitting: Emergency Medicine

## 2011-07-12 DIAGNOSIS — E119 Type 2 diabetes mellitus without complications: Secondary | ICD-10-CM | POA: Insufficient documentation

## 2011-07-12 DIAGNOSIS — M549 Dorsalgia, unspecified: Secondary | ICD-10-CM

## 2011-07-12 DIAGNOSIS — E7800 Pure hypercholesterolemia, unspecified: Secondary | ICD-10-CM | POA: Insufficient documentation

## 2011-07-12 DIAGNOSIS — Z794 Long term (current) use of insulin: Secondary | ICD-10-CM | POA: Insufficient documentation

## 2011-07-12 DIAGNOSIS — B379 Candidiasis, unspecified: Secondary | ICD-10-CM

## 2011-07-12 DIAGNOSIS — K0889 Other specified disorders of teeth and supporting structures: Secondary | ICD-10-CM

## 2011-07-12 DIAGNOSIS — I10 Essential (primary) hypertension: Secondary | ICD-10-CM | POA: Insufficient documentation

## 2011-07-12 DIAGNOSIS — B3789 Other sites of candidiasis: Secondary | ICD-10-CM | POA: Insufficient documentation

## 2011-07-12 DIAGNOSIS — F411 Generalized anxiety disorder: Secondary | ICD-10-CM | POA: Insufficient documentation

## 2011-07-12 DIAGNOSIS — Z90710 Acquired absence of both cervix and uterus: Secondary | ICD-10-CM | POA: Insufficient documentation

## 2011-07-12 DIAGNOSIS — Z87891 Personal history of nicotine dependence: Secondary | ICD-10-CM | POA: Insufficient documentation

## 2011-07-12 DIAGNOSIS — G8929 Other chronic pain: Secondary | ICD-10-CM | POA: Insufficient documentation

## 2011-07-12 DIAGNOSIS — M545 Low back pain, unspecified: Secondary | ICD-10-CM | POA: Insufficient documentation

## 2011-07-12 DIAGNOSIS — N61 Mastitis without abscess: Secondary | ICD-10-CM | POA: Insufficient documentation

## 2011-07-12 DIAGNOSIS — M129 Arthropathy, unspecified: Secondary | ICD-10-CM | POA: Insufficient documentation

## 2011-07-12 DIAGNOSIS — Z7901 Long term (current) use of anticoagulants: Secondary | ICD-10-CM | POA: Insufficient documentation

## 2011-07-12 DIAGNOSIS — K089 Disorder of teeth and supporting structures, unspecified: Secondary | ICD-10-CM | POA: Insufficient documentation

## 2011-07-12 MED ORDER — CLOTRIMAZOLE 1 % EX CREA: TOPICAL_CREAM | CUTANEOUS | Status: AC

## 2011-07-12 MED ORDER — OXYCODONE-ACETAMINOPHEN 5-325 MG PO TABS
2.0000 | ORAL_TABLET | Freq: Once | ORAL | Status: AC
  Administered 2011-07-12: 2 via ORAL
  Filled 2011-07-12: qty 2

## 2011-07-12 NOTE — ED Provider Notes (Signed)
History     CSN: [id number]  Arrival date & time 07/12/11  0900   First MD Initiated Contact with Patient 07/12/11 [phone]      Chief Complaint  Patient presents with  . Medication Refill    HPI The patient presents with multiple complaints.  She notes that she is always in some degree of discomfort, and has recently had difficulty maintaining care from a particular physician.  She notes that over the past month her chronic low back pain has become more pronounced, and she has not obtained any refills of her Percocet.  She denies any new falls, trauma, lower extremity dysesthesia, bowel or bladder incontinence.  Over this past month patient also developed pain focally about her right upper posterior teeth.  The pain is described as dull, worse with biting, improved transiently with Percocet.  She denies any new trismus, dysphagia, vomiting, fever. The patient also complains of a right inframammary rash.  She notes that over the past month she initially gradually developed irritation about that area, and to 2 weeks ago was started on a topical antifungal.  She notes that she is out of this medication and continue to have itchiness about the right inframammary area.  She denies any new spread of erythema or dry skin, any new pain in this area.   Past Medical History  Diagnosis Date  . Hypertension   . Diabetes mellitus   . Arthritis   . High cholesterol   . Blood transfusion   . Anxiety     Past Surgical History  Procedure Date  . Cardiac valve replacement   . Back surgery   . Abdominal hysterectomy     No family history on file.  History  Substance Use Topics  . Smoking status: Former Games developer  . Smokeless tobacco: Never Used  . Alcohol Use: No    OB History    Grav Para Term Preterm Abortions TAB SAB Ect Mult Living                  Review of Systems  Constitutional:       HPI  HENT:       HPI otherwise negative  Eyes: Negative.   Respiratory:       HPI, otherwise  negative  Cardiovascular:       HPI, otherwise nmegative  Gastrointestinal: Negative for vomiting.  Genitourinary:       HPI, otherwise negative  Musculoskeletal:       HPI, otherwise negative  Skin: Negative.   Neurological: Negative for syncope.    Allergies  Review of patient's allergies indicates no known allergies.  Home Medications   Current Outpatient Rx  Name Route Sig Dispense Refill  . ACETAMINOPHEN 500 MG PO TABS Oral Take 1,000 mg by mouth every 6 (six) hours as needed. For pain    . ALBUTEROL SULFATE HFA 108 (90 BASE) MCG/ACT IN AERS Inhalation Inhale 2 puffs into the lungs every 6 (six) hours as needed. For wheezing      . ALPRAZOLAM 0.5 MG PO TABS Oral Take 0.5 mg by mouth 2 (two) times daily as needed. For sleep or anxiety    . AMLODIPINE BESY-BENAZEPRIL HCL 10-20 MG PO CAPS Oral Take 1 capsule by mouth daily.      Marland Kitchen CARVEDILOL 25 MG PO TABS Oral Take 25 mg by mouth daily.     Marland Kitchen CLOTRIMAZOLE 1 % EX CREA  Apply to affected area 2 times daily 15 g 0  .  ESTROGENS CONJUGATED 1.25 MG PO TABS Oral Take 1.25 mg by mouth daily.      Marland Kitchen FERROUS SULFATE 325 (65 FE) MG PO TABS Oral Take 325 mg by mouth daily with breakfast.    . FUROSEMIDE 40 MG PO TABS Oral Take 40 mg by mouth daily.     Marland Kitchen GABAPENTIN 800 MG PO TABS Oral Take 800 mg by mouth 3 (three) times daily.      . INSULIN ISOPHANE & REGULAR (70-30) 100 UNIT/ML [place] SUSP Subcutaneous Inject 75 Units into the skin 2 (two) times daily.      Marland Kitchen NORTRIPTYLINE HCL 50 MG PO CAPS Oral Take 100 mg by mouth at bedtime.     . OMEPRAZOLE 20 MG PO CPDR Oral Take 20 mg by mouth daily.      Marland Kitchen POTASSIUM CHLORIDE CRYS ER 20 MEQ PO TBCR Oral Take 20 mEq by mouth daily.      Marland Kitchen PRESCRIPTION MEDICATION Oral Take 1 tablet by mouth daily. Kidney medication to help clear suds from urine    . SERTRALINE HCL 100 MG PO TABS Oral Take 100 mg by mouth daily.      Marland Kitchen SITAGLIPTIN PHOSPHATE 100 MG PO TABS Oral Take 100 mg by mouth daily.      . WARFARIN  SODIUM 1 MG PO TABS Oral Take 5 mg by mouth once a week. Take 5 mg along with the 5 mg tablet to equal 10 mg on Wednesday    . WARFARIN SODIUM 5 MG PO TABS Oral Take 5 mg by mouth daily. (except on Wednesday take additional 5 mg to equal 10 mg)      BP 158/93  Pulse 69  Temp(Src) 97.9 F (36.6 C) (Oral)  Resp 16  SpO2 100%  Physical Exam  Nursing note and vitals reviewed. Constitutional: She is oriented to person, place, and time. She appears well-developed and well-nourished. No distress.  HENT:  Head: Normocephalic and atraumatic. No trismus in the jaw.  Mouth/Throat: Uvula is midline, oropharynx is clear and moist and mucous membranes are normal. No oral lesions. Abnormal dentition. No oropharyngeal exudate, posterior oropharyngeal edema, posterior oropharyngeal erythema or tonsillar abscesses.    Eyes: Conjunctivae and EOM are normal.  Cardiovascular: Normal rate and regular rhythm.   Pulmonary/Chest: Effort normal and breath sounds normal. No stridor. No respiratory distress.    Abdominal: She exhibits no distension.  Musculoskeletal: She exhibits no edema.       Strength is 5/5 in both upper and lower extremities.  There is diffuse mild tenderness to palpation across the lower back without appreciable deformity  Neurological: She is alert and oriented to person, place, and time. No cranial nerve deficit.  Skin: Skin is warm and dry.  Psychiatric: She has a normal mood and affect.    ED Course  Procedures (including critical care time)  Labs Reviewed - No data to display No results found.   1. Chronic back pain   2. Yeast infection   3. Pain, dental       MDM  This elderly appearing female with chronic low back pain now presents with request for pain medication refill and concerns over persistency of her low back pain, as well as other new complaints.  On my exam the patient is in no distress with unremarkable vital signs, no focal neurologic deficits, nor any  evidence of systemic infection.  The patient's teeth are poor, though there is no evidence of dental abscess or infection during the patient  also has a right inframammary yeast infection, which is generally well healing and appearance.  I spent a considerable amount of time discussing the requirement for ongoing management of her chronic pain via primary care and pain management specialists.  The patient was disappointed in the denial of emergency Department provision of narcotic prescriptions.  She is informed of narcotics should from a single provider only.  Although she requested narcotics several times, the patient received only a single dose of oral medication here in the emergency department and was counseled on need to expedite her followup care for continued pain management.       Gerhard Munch, MD 07/12/11 [phone]

## 2011-07-12 NOTE — ED Notes (Signed)
Pt sts she is out of percocet and needs more for chronic back and neck pain. Pt also c/o rash under breast.

## 2011-07-12 NOTE — Discharge Instructions (Signed)
As we discussed, it is very important that you continue to have your condition managed by your primary care physician in the pain management specialists.  If you develop any new, or concerning changes in your condition, please return to the emergency department immediately.

## 2011-07-30 ENCOUNTER — Ambulatory Visit: Payer: Medicaid Other | Admitting: Physical Medicine & Rehabilitation

## 2011-08-02 ENCOUNTER — Encounter: Payer: Self-pay | Admitting: Physical Medicine & Rehabilitation

## 2011-08-02 ENCOUNTER — Ambulatory Visit (HOSPITAL_BASED_OUTPATIENT_CLINIC_OR_DEPARTMENT_OTHER): Payer: Medicaid Other | Admitting: Physical Medicine & Rehabilitation

## 2011-08-02 ENCOUNTER — Encounter: Payer: Medicaid Other | Attending: Physical Medicine & Rehabilitation

## 2011-08-02 VITALS — BP 140/60 | HR 56 | Resp 14 | Ht 69.0 in | Wt 261.0 lb

## 2011-08-02 DIAGNOSIS — M47812 Spondylosis without myelopathy or radiculopathy, cervical region: Secondary | ICD-10-CM | POA: Insufficient documentation

## 2011-08-02 DIAGNOSIS — E1149 Type 2 diabetes mellitus with other diabetic neurological complication: Secondary | ICD-10-CM

## 2011-08-02 DIAGNOSIS — E1142 Type 2 diabetes mellitus with diabetic polyneuropathy: Secondary | ICD-10-CM

## 2011-08-02 DIAGNOSIS — M7062 Trochanteric bursitis, left hip: Secondary | ICD-10-CM | POA: Insufficient documentation

## 2011-08-02 DIAGNOSIS — G5600 Carpal tunnel syndrome, unspecified upper limb: Secondary | ICD-10-CM | POA: Insufficient documentation

## 2011-08-02 DIAGNOSIS — E1140 Type 2 diabetes mellitus with diabetic neuropathy, unspecified: Secondary | ICD-10-CM | POA: Insufficient documentation

## 2011-08-02 DIAGNOSIS — G894 Chronic pain syndrome: Secondary | ICD-10-CM | POA: Insufficient documentation

## 2011-08-02 DIAGNOSIS — M47817 Spondylosis without myelopathy or radiculopathy, lumbosacral region: Secondary | ICD-10-CM

## 2011-08-02 DIAGNOSIS — M542 Cervicalgia: Secondary | ICD-10-CM | POA: Insufficient documentation

## 2011-08-02 DIAGNOSIS — M76899 Other specified enthesopathies of unspecified lower limb, excluding foot: Secondary | ICD-10-CM | POA: Insufficient documentation

## 2011-08-02 DIAGNOSIS — M545 Low back pain, unspecified: Secondary | ICD-10-CM | POA: Insufficient documentation

## 2011-08-02 DIAGNOSIS — M47816 Spondylosis without myelopathy or radiculopathy, lumbar region: Secondary | ICD-10-CM | POA: Insufficient documentation

## 2011-08-02 NOTE — Patient Instructions (Signed)
You had an injection today of your left trochanteric bursa this may help for weeks or months. You'll need to do stretching to keep the pain from coming back again You'll need to use heat up to 30 minutes on your neck and low back and do range of motion exercises. I will give you pamphlets for this In terms your medications because of all the medicines you're on for the various conditions the only safe medication in my opinion would be Tylenol 2 tablets 4 times per day. I recommend aquatic therapy at least 3 times per week. You can go chair local [facility] to do this. In terms of your carpal tunnel syndrome I would recommend a wrist injection we could do this at this clinic and I will schedule you. As we discussed I do not think narcotic medications would be a safe option in your situation, you're free to seek other opinions from other doctors if you wish

## 2011-08-02 NOTE — Progress Notes (Signed)
Subjective:    Patient ID: Donna Hopkins, female    DOB: 1955/09/21, 56 y.o.   MRN: [id number]  HPI  20 year history of back pain and neck pain. No history of trauma. Has been seen by neurology in the past. Has had oxycodone in the past. Was not consenting to urine drug screen or pill counts.the patient states she was not cleared for surgery due to cardiac reasons. Has a documented carpal tunnel syndrome but has not undergone surgery for this or any injections for this. Also complains of left hip pain X-rays of the lumbar spine reviewed it shows moderate arthritis at the L5-S1 facet joints. Mild L3-4 anterolisthesis Neck the mild arthritis in the C4-5-6 levels. Past history significant for diabetes,chronic Coumadin for valvular heart disease, hepatic neuropathy  Pain Inventory Average Pain 9 Pain Right Now 10 My pain is sharp, burning, tingling and aching  In the last 24 hours, has pain interfered with the following? General activity 3 Relation with others 6 Enjoyment of life 3 What TIME of day is your pain at its worst? Morning, Evening, Night Sleep (in general) Poor  Pain is worse with: walking, bending, sitting and standing Pain improves with: rest and medication Relief from Meds: 7  Mobility use a cane how many minutes can you walk? 15 ability to climb steps?  yes do you drive?  yes needs help with transfers  Function disabled: date disabled 75 I need assistance with the following:  meal prep, household duties and shopping  Neuro/Psych bowel control problems weakness tingling spasms depression anxiety  Prior Studies x-rays CT/MRI nerve study  Physicians involved in your care Didn't list any  Review of Systems  Constitutional: Positive for chills, fatigue and unexpected weight change.  HENT: Negative.   Eyes: Negative.   Respiratory: Negative.   Cardiovascular: Negative.   Gastrointestinal: Negative.   Genitourinary: Negative.   Musculoskeletal:  Positive for joint swelling.  Skin: Positive for rash.  Neurological: Positive for weakness.  Hematological: Bruises/bleeds easily.  Psychiatric/Behavioral: Negative.        Objective:   Physical Exam  Constitutional: She is oriented to person, place, and time. She appears well-developed.  Musculoskeletal:       Left hip: She exhibits decreased range of motion and tenderness.       Lumbar back: She exhibits decreased range of motion.       Left hip has tenderness over the trochanteric bursa  Neurological: She is alert and oriented to person, place, and time. She has normal strength. She displays abnormal reflex. No sensory deficit. Gait abnormal.  Reflex Scores:      Tricep reflexes are 1+ on the right side and 1+ on the left side.      Bicep reflexes are 1+ on the right side and 1+ on the left side.      Brachioradialis reflexes are 1+ on the right side and 1+ on the left side.      Patellar reflexes are 1+ on the right side and 1+ on the left side.      Achilles reflexes are 1+ on the right side and 1+ on the left side.      Decreased sensation of pinprick in the feet Ambulation is slow no evidence of foot drag or knee instability  Straight leg raising test is negative Muscle in the lower extremity and distally showing no evidence of atrophy  Psychiatric: Judgment and thought content normal. Her affect is blunt. Her speech is delayed. Cognition and memory  are normal. She is inattentive.          Assessment & Plan:  1. Chronic pain syndrome she has been on narcotic analgesics in the past but was not compliant. She has multiple medical comorbidities limited treatment options. She is not a good candidate for tramadol because of the high-dose nortriptyline. She is not a good candidate for spine injections do to Coumadin for valvular heart disease. 2. Neck pain due to cervical spondylosis mild recommend heat recommend range of motion 3. Back pain mainly due to lumbar spondylosis  once again recommend heat recommend range of motion and ambulation or aquatic therapy. She can take Tylenol 2 tablets 4 times per day the extra strength. Continue her current medications.  4. Left trochanteric bursitis we'll inject today instructed in stretching exercises to prevent recurrence 5. Carpal tunnel syndrome would benefit from wrist injections but ultimately we'll have to undergo surgery. Discussed with the patient. We explained the reason for nonnarcotic management. She is free to get another opinion on this.

## 2011-08-21 ENCOUNTER — Ambulatory Visit: Payer: Medicaid Other | Admitting: Physical Medicine & Rehabilitation

## 2011-08-31 ENCOUNTER — Ambulatory Visit: Payer: Medicaid Other | Admitting: Physical Medicine & Rehabilitation

## 2011-08-31 ENCOUNTER — Encounter: Payer: Medicaid Other | Attending: Physical Medicine & Rehabilitation

## 2011-08-31 DIAGNOSIS — G5600 Carpal tunnel syndrome, unspecified upper limb: Secondary | ICD-10-CM | POA: Insufficient documentation

## 2011-08-31 DIAGNOSIS — M545 Low back pain, unspecified: Secondary | ICD-10-CM | POA: Insufficient documentation

## 2011-08-31 DIAGNOSIS — M47812 Spondylosis without myelopathy or radiculopathy, cervical region: Secondary | ICD-10-CM | POA: Insufficient documentation

## 2011-08-31 DIAGNOSIS — G894 Chronic pain syndrome: Secondary | ICD-10-CM | POA: Insufficient documentation

## 2011-08-31 DIAGNOSIS — M47817 Spondylosis without myelopathy or radiculopathy, lumbosacral region: Secondary | ICD-10-CM | POA: Insufficient documentation

## 2011-08-31 DIAGNOSIS — M542 Cervicalgia: Secondary | ICD-10-CM | POA: Insufficient documentation

## 2011-08-31 DIAGNOSIS — M76899 Other specified enthesopathies of unspecified lower limb, excluding foot: Secondary | ICD-10-CM | POA: Insufficient documentation

## 2012-07-08 HISTORY — PX: BI-VENTRICULAR IMPLANTABLE CARDIOVERTER DEFIBRILLATOR  (CRT-D): SHX5747

## 2012-07-20 ENCOUNTER — Emergency Department (HOSPITAL_BASED_OUTPATIENT_CLINIC_OR_DEPARTMENT_OTHER)
Admission: EM | Admit: 2012-07-20 | Discharge: 2012-07-21 | Disposition: A | Discharge status: Home / Self Care | Payer: Medicaid Other | Attending: Emergency Medicine | Admitting: Emergency Medicine

## 2012-07-20 ENCOUNTER — Encounter (HOSPITAL_BASED_OUTPATIENT_CLINIC_OR_DEPARTMENT_OTHER): Payer: Self-pay | Admitting: *Deleted

## 2012-07-20 DIAGNOSIS — I10 Essential (primary) hypertension: Secondary | ICD-10-CM | POA: Insufficient documentation

## 2012-07-20 DIAGNOSIS — E7800 Pure hypercholesterolemia, unspecified: Secondary | ICD-10-CM | POA: Insufficient documentation

## 2012-07-20 DIAGNOSIS — T82897A Other specified complication of cardiac prosthetic devices, implants and grafts, initial encounter: Secondary | ICD-10-CM | POA: Insufficient documentation

## 2012-07-20 DIAGNOSIS — Z79899 Other long term (current) drug therapy: Secondary | ICD-10-CM | POA: Insufficient documentation

## 2012-07-20 DIAGNOSIS — T792XXA Traumatic secondary and recurrent hemorrhage and seroma, initial encounter: Secondary | ICD-10-CM

## 2012-07-20 DIAGNOSIS — Y838 Other surgical procedures as the cause of abnormal reaction of the patient, or of later complication, without mention of misadventure at the time of the procedure: Secondary | ICD-10-CM | POA: Insufficient documentation

## 2012-07-20 DIAGNOSIS — Z87891 Personal history of nicotine dependence: Secondary | ICD-10-CM | POA: Insufficient documentation

## 2012-07-20 DIAGNOSIS — Z7901 Long term (current) use of anticoagulants: Secondary | ICD-10-CM | POA: Insufficient documentation

## 2012-07-20 DIAGNOSIS — Z8739 Personal history of other diseases of the musculoskeletal system and connective tissue: Secondary | ICD-10-CM | POA: Insufficient documentation

## 2012-07-20 DIAGNOSIS — F411 Generalized anxiety disorder: Secondary | ICD-10-CM | POA: Insufficient documentation

## 2012-07-20 DIAGNOSIS — E119 Type 2 diabetes mellitus without complications: Secondary | ICD-10-CM | POA: Insufficient documentation

## 2012-07-20 DIAGNOSIS — Z794 Long term (current) use of insulin: Secondary | ICD-10-CM | POA: Insufficient documentation

## 2012-07-20 NOTE — ED Notes (Addendum)
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 Steri strips removed on Friday and now suture line is oozing. Painful. Pt on blood thinners.

## 2012-07-21 NOTE — ED Provider Notes (Addendum)
History    Scribed for [name], MD, the patient was seen in room MH12/MH12. This chart was scribed by Lewanda Rife, ED scribe. Patient's care was started at 0043   CSN: [id number]  Arrival date & time 07/20/12  2117   First MD Initiated Contact with Patient 07/21/12 [phone]      Chief Complaint  Patient presents with   Wound Check    (Consider location/radiation/quality/duration/timing/severity/associated sxs/prior treatment) HPI HPI Comments: Donna Hopkins is a 57 y.o. female who presents to the Emergency Department complaining of wound check from upper left chest where pacemaker was placed 07/08/2012. Reports oozing blood from site onset yesterday 5 pm. Reports associated mild typical pain since surgery. Reports steri-strips removed 4 days ago. Denies fever. Denies any aggravating or alleviating symptoms. Denies taking any medications prior to arrival to treat pain.   Past Medical History  Diagnosis Date   Hypertension    Diabetes mellitus    High cholesterol    Blood transfusion    Anxiety    Arthritis     neck, back, hands    Past Surgical History  Procedure Laterality Date   Cardiac valve replacement     Back surgery     Abdominal hysterectomy     Pacemaker insertion      History reviewed. No pertinent family history.  History  Substance Use Topics   Smoking status: Former Smoker   Smokeless tobacco: Never Used   Alcohol Use: No    OB History   Grav Para Term Preterm Abortions TAB SAB Ect Mult Living                  Review of Systems  Skin: Positive for wound.  All other systems reviewed and are negative.   A complete 10 system review of systems was obtained and all systems are negative except as noted in the HPI and PMH.    Allergies  Tetanus toxoids  Home Medications   Current Outpatient Rx  Name  Route  Sig  Dispense  Refill   acetaminophen (TYLENOL) 500 MG tablet   Oral   Take 1,000 mg by mouth every 6 (six) hours  as needed. For pain          albuterol (PROVENTIL HFA;VENTOLIN HFA) 108 (90 BASE) MCG/ACT inhaler   Inhalation   Inhale 2 puffs into the lungs every 6 (six) hours as needed. For wheezing            ALPRAZolam (XANAX) 0.5 MG tablet   Oral   Take 0.5 mg by mouth 2 (two) times daily as needed. For sleep or anxiety          amLODipine-benazepril (LOTREL) 10-20 MG per capsule   Oral   Take 1 capsule by mouth daily.            baclofen (LIORESAL) 20 MG tablet   Oral   Take 20 mg by mouth 3 (three) times daily.          carisoprodol (SOMA) 350 MG tablet   Oral   Take 350 mg by mouth 2 (two) times daily.          carvedilol (COREG) 25 MG tablet   Oral   Take 25 mg by mouth daily.           estrogens, conjugated, (PREMARIN) 1.25 MG tablet   Oral   Take 1.25 mg by mouth daily.            ferrous sulfate  325 (65 FE) MG tablet   Oral   Take 325 mg by mouth daily with breakfast.          furosemide (LASIX) 40 MG tablet   Oral   Take 40 mg by mouth daily.           gabapentin (NEURONTIN) 800 MG tablet   Oral   Take 800 mg by mouth 3 (three) times daily.            insulin NPH-insulin regular (NOVOLIN 70/30) (70-30) 100 UNIT/ML injection   Subcutaneous   Inject 75 Units into the skin 2 (two) times daily.            nortriptyline (PAMELOR) 50 MG capsule   Oral   Take 100 mg by mouth at bedtime.           omeprazole (PRILOSEC) 20 MG capsule   Oral   Take 20 mg by mouth daily.            oxyCODONE-acetaminophen (PERCOCET) 10-325 MG per tablet   Oral   Take 1 tablet by mouth 3 (three) times daily.          potassium chloride SA (K-DUR,KLOR-CON) 20 MEQ tablet   Oral   Take 20 mEq by mouth daily.            PRESCRIPTION MEDICATION   Oral   Take 1 tablet by mouth daily. Kidney medication to help clear suds from urine          quinapril (ACCUPRIL) 40 MG tablet   Oral   Take 40 mg by mouth daily.          sertraline  (ZOLOFT) 100 MG tablet   Oral   Take 100 mg by mouth daily.            sevelamer (RENVELA) 800 MG tablet   Oral   Take 800 mg by mouth daily.          sitaGLIPtin (JANUVIA) 100 MG tablet   Oral   Take 100 mg by mouth daily.            venlafaxine XR (EFFEXOR-XR) 75 MG 24 hr capsule   Oral   Take 75 mg by mouth 2 (two) times daily.          warfarin (COUMADIN) 1 MG tablet   Oral   Take 5 mg by mouth once a week. Take 5 mg along with the 5 mg tablet to equal 10 mg on Wednesday          warfarin (COUMADIN) 5 MG tablet   Oral   Take 5 mg by mouth daily. (except on Wednesday take additional 5 mg to equal 10 mg)           BP 111/69   Temp(Src) 98.3 F (36.8 C) (Oral)   Resp 20   Ht 5\' 10"  (1.778 m)   Wt 254 lb (115.214 kg)   BMI 36.45 kg/m2   SpO2 95%  Physical Exam  Nursing note and vitals reviewed. Constitutional: She is oriented to person, place, and time. She appears well-developed and well-nourished. No distress.  HENT:  Head: Normocephalic and atraumatic.  Eyes: EOM are normal.  Neck: Neck supple. No tracheal deviation present.  Cardiovascular: Normal rate, regular rhythm and intact distal pulses.   Occasional extrasystoles (ectopy ) are present.  No murmur heard. Pulmonary/Chest: Effort normal. No respiratory distress.  Musculoskeletal: Normal range of motion.  Neurological: She is alert and oriented to person,  place, and time.  Skin: Skin is warm and dry.  7 cm surgical incision left upper chest with pacemaker palpable under the skin Surrounding ecchymosis, and oozing with blood. No surrounding erythema, no lymphangitis, no signs of infection noted    Psychiatric: Her behavior is normal. She exhibits a depressed mood.    ED Course  Procedures (including critical care time)   1. Bleeding from wound, initial encounter       MDM  Bleeding postoperative hematoma, likely due to liquefaction of previously clotted blood.     I personally  performed the services described in this documentation, which was scribed in my presence.  The recorded information has been reviewed and is accurate.    [name], MD 07/21/12 0103  [name], MD 07/21/12 1610

## 2013-02-16 ENCOUNTER — Emergency Department (HOSPITAL_BASED_OUTPATIENT_CLINIC_OR_DEPARTMENT_OTHER)
Admission: EM | Admit: 2013-02-16 | Discharge: 2013-02-16 | Disposition: A | Discharge status: Home / Self Care | Payer: Medicaid Other | Attending: Emergency Medicine | Admitting: Emergency Medicine

## 2013-02-16 ENCOUNTER — Encounter (HOSPITAL_BASED_OUTPATIENT_CLINIC_OR_DEPARTMENT_OTHER): Payer: Self-pay | Admitting: Emergency Medicine

## 2013-02-16 DIAGNOSIS — S4980XA Other specified injuries of shoulder and upper arm, unspecified arm, initial encounter: Secondary | ICD-10-CM | POA: Insufficient documentation

## 2013-02-16 DIAGNOSIS — Y9389 Activity, other specified: Secondary | ICD-10-CM | POA: Insufficient documentation

## 2013-02-16 DIAGNOSIS — M25511 Pain in right shoulder: Secondary | ICD-10-CM

## 2013-02-16 DIAGNOSIS — Y92410 Unspecified street and highway as the place of occurrence of the external cause: Secondary | ICD-10-CM | POA: Insufficient documentation

## 2013-02-16 DIAGNOSIS — F411 Generalized anxiety disorder: Secondary | ICD-10-CM | POA: Insufficient documentation

## 2013-02-16 DIAGNOSIS — M129 Arthropathy, unspecified: Secondary | ICD-10-CM | POA: Insufficient documentation

## 2013-02-16 DIAGNOSIS — Z794 Long term (current) use of insulin: Secondary | ICD-10-CM | POA: Insufficient documentation

## 2013-02-16 DIAGNOSIS — Z7901 Long term (current) use of anticoagulants: Secondary | ICD-10-CM | POA: Insufficient documentation

## 2013-02-16 DIAGNOSIS — Z9889 Other specified postprocedural states: Secondary | ICD-10-CM | POA: Insufficient documentation

## 2013-02-16 DIAGNOSIS — I10 Essential (primary) hypertension: Secondary | ICD-10-CM | POA: Insufficient documentation

## 2013-02-16 DIAGNOSIS — Z954 Presence of other heart-valve replacement: Secondary | ICD-10-CM | POA: Insufficient documentation

## 2013-02-16 DIAGNOSIS — Z950 Presence of cardiac pacemaker: Secondary | ICD-10-CM | POA: Insufficient documentation

## 2013-02-16 DIAGNOSIS — Z87891 Personal history of nicotine dependence: Secondary | ICD-10-CM | POA: Insufficient documentation

## 2013-02-16 DIAGNOSIS — E7800 Pure hypercholesterolemia, unspecified: Secondary | ICD-10-CM | POA: Insufficient documentation

## 2013-02-16 DIAGNOSIS — Z79899 Other long term (current) drug therapy: Secondary | ICD-10-CM | POA: Insufficient documentation

## 2013-02-16 DIAGNOSIS — E119 Type 2 diabetes mellitus without complications: Secondary | ICD-10-CM | POA: Insufficient documentation

## 2013-02-16 DIAGNOSIS — S46909A Unspecified injury of unspecified muscle, fascia and tendon at shoulder and upper arm level, unspecified arm, initial encounter: Secondary | ICD-10-CM | POA: Insufficient documentation

## 2013-02-16 NOTE — ED Provider Notes (Signed)
Medical screening examination/treatment/procedure(s) were performed by non-physician practitioner and as supervising physician I was immediately available for consultation/collaboration.  EKG Interpretation   None        Juliet Rude. Rubin Payor, MD 02/16/13 [phone]

## 2013-02-16 NOTE — ED Notes (Signed)
First nusing assessmant done at 1907 was placed on wrong pt.  Second assessment is correct
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
## 2013-02-16 NOTE — ED Notes (Signed)
Right shoulder pain x 2 months. She has had it injected without improvement.

## 2013-09-17 ENCOUNTER — Encounter (HOSPITAL_BASED_OUTPATIENT_CLINIC_OR_DEPARTMENT_OTHER): Payer: Self-pay | Admitting: Emergency Medicine

## 2013-09-17 ENCOUNTER — Emergency Department (HOSPITAL_BASED_OUTPATIENT_CLINIC_OR_DEPARTMENT_OTHER): Payer: Medicaid Other

## 2013-09-17 ENCOUNTER — Emergency Department (HOSPITAL_BASED_OUTPATIENT_CLINIC_OR_DEPARTMENT_OTHER)
Admission: EM | Admit: 2013-09-17 | Discharge: 2013-09-18 | Disposition: A | Discharge status: Home / Self Care | Payer: Medicaid Other | Attending: Emergency Medicine | Admitting: Emergency Medicine

## 2013-09-17 DIAGNOSIS — Z9889 Other specified postprocedural states: Secondary | ICD-10-CM | POA: Insufficient documentation

## 2013-09-17 DIAGNOSIS — M47812 Spondylosis without myelopathy or radiculopathy, cervical region: Secondary | ICD-10-CM | POA: Insufficient documentation

## 2013-09-17 DIAGNOSIS — R05 Cough: Secondary | ICD-10-CM | POA: Diagnosis not present

## 2013-09-17 DIAGNOSIS — M19049 Primary osteoarthritis, unspecified hand: Secondary | ICD-10-CM | POA: Insufficient documentation

## 2013-09-17 DIAGNOSIS — Z79899 Other long term (current) drug therapy: Secondary | ICD-10-CM | POA: Insufficient documentation

## 2013-09-17 DIAGNOSIS — R209 Unspecified disturbances of skin sensation: Secondary | ICD-10-CM | POA: Insufficient documentation

## 2013-09-17 DIAGNOSIS — R110 Nausea: Secondary | ICD-10-CM | POA: Diagnosis not present

## 2013-09-17 DIAGNOSIS — I10 Essential (primary) hypertension: Secondary | ICD-10-CM | POA: Insufficient documentation

## 2013-09-17 DIAGNOSIS — Z950 Presence of cardiac pacemaker: Secondary | ICD-10-CM | POA: Diagnosis not present

## 2013-09-17 DIAGNOSIS — Z87891 Personal history of nicotine dependence: Secondary | ICD-10-CM | POA: Diagnosis not present

## 2013-09-17 DIAGNOSIS — Z794 Long term (current) use of insulin: Secondary | ICD-10-CM | POA: Diagnosis not present

## 2013-09-17 DIAGNOSIS — E119 Type 2 diabetes mellitus without complications: Secondary | ICD-10-CM | POA: Insufficient documentation

## 2013-09-17 DIAGNOSIS — R202 Paresthesia of skin: Secondary | ICD-10-CM

## 2013-09-17 DIAGNOSIS — F411 Generalized anxiety disorder: Secondary | ICD-10-CM | POA: Insufficient documentation

## 2013-09-17 DIAGNOSIS — Z7901 Long term (current) use of anticoagulants: Secondary | ICD-10-CM | POA: Insufficient documentation

## 2013-09-17 DIAGNOSIS — M479 Spondylosis, unspecified: Secondary | ICD-10-CM | POA: Diagnosis not present

## 2013-09-17 DIAGNOSIS — R059 Cough, unspecified: Secondary | ICD-10-CM | POA: Insufficient documentation

## 2013-09-17 LAB — URINALYSIS, ROUTINE W REFLEX MICROSCOPIC
Bilirubin Urine: NEGATIVE
Glucose, UA: NEGATIVE mg/dL
Hgb urine dipstick: NEGATIVE
KETONES UR: NEGATIVE mg/dL
Leukocytes, UA: NEGATIVE
NITRITE: NEGATIVE
PH: 6.5 (ref 5.0–8.0)
PROTEIN: NEGATIVE mg/dL
Specific Gravity, Urine: 1.007 (ref 1.005–1.030)
Urobilinogen, UA: 0.2 mg/dL (ref 0.0–1.0)

## 2013-09-17 NOTE — ED Notes (Signed)
Pt sts she has been treated recently by pmd for dizziness with Antivert.  Sts it helped but she is out of it. Presents with paperwork from [facility] showing blood work done June 30th. Reports "bug bites" in her back that has been happening for "years."

## 2013-09-17 NOTE — ED Notes (Signed)
Patient transported to CT °

## 2013-09-17 NOTE — ED Provider Notes (Signed)
CSN: [id number]     Arrival date & time 09/17/13  2136 History  This chart was scribed for Shanna Cisco, MD by Chestine Spore, ED Scribe. The patient was seen in room MH02/MH02 at 11:21 PM.     Chief Complaint  Patient presents with   Numbness     Patient is a 58 y.o. female presenting with neurologic complaint. The history is provided by the patient. No language interpreter was used.  Neurologic Problem This is a new problem. The current episode started more than 2 days ago. The problem occurs daily. The problem has not changed since onset.Pertinent negatives include no chest pain, no abdominal pain, no headaches and no shortness of breath. The symptoms are aggravated by exertion. Relieved by: rubbing her palms. She has tried nothing for the symptoms.   HPI Comments: Donna Hopkins is a 58 y.o. female with h/o DM who presents to the Emergency Department complaining of bilateral hand and feet numbness. She states that the numbness began in her left arm since Monday and tonight it began in her right arm as well.  She states that her feet began to feel numb on this week. She states that the numbness is coming and going. She states that when it comes it will last for 5-10 minutes. She states that lately she will get the numbness episodes more than a couple times a hour. She states that rubbing her hands make the numbness in her hands feel better. She states that if she does something with her hands it makes the pain worse. She states that these symptoms are new. She states that she has associated symptoms of cough, and nausea. She states that it is a dry non-reproductive cough.   She states that she was trying to get out of the bed and fell back down into the bed about 1 week ago. She states that when she laid down the room will spin and it will last for about 10 mins. She states that the dizziness is resolved now, and she was her PCP for this and received an unknown medication. She states that her  baseline for DM is 200. She states that she was put on Metformin and it has brought down her BS to 120. She states that she is not eating and drinking well as of lately. She states that she had a recent fall 1 week ago. She states that she fell asleep on the toilet and fell sideways off the toilet. She states that she was not able to catch herself. She denies hitting her head.    She states that she was on vitamin D 5000 and she was taken off the medications. She states that she was dx with carpal tunnel about a year ago. She denies using her hands a lot. She states that she takes coumadin for a valve placement. She states that she has a pacemaker. Dr. Charolotte Eke and Dr. Chales Abrahams at [facility] are her Cardiologist. She states that she gets her coumadin rechecked on Monday.   Past Medical History  Diagnosis Date   Hypertension    Diabetes mellitus    High cholesterol    Blood transfusion    Anxiety    Arthritis     neck, back, hands   Past Surgical History  Procedure Laterality Date   Cardiac valve replacement     Back surgery     Abdominal hysterectomy     Pacemaker insertion     No family history on file. History  Substance Use Topics   Smoking status: Former Smoker   Smokeless tobacco: Never Used   Alcohol Use: No   OB History   Grav Para Term Preterm Abortions TAB SAB Ect Mult Living                 Review of Systems  Constitutional: Negative for fever, chills, diaphoresis, activity change, appetite change and fatigue.  HENT: Negative for congestion, facial swelling, rhinorrhea and sore throat.   Eyes: Negative for photophobia and discharge.  Respiratory: Positive for cough. Negative for chest tightness and shortness of breath.   Cardiovascular: Negative for chest pain, palpitations and leg swelling.  Gastrointestinal: Positive for nausea. Negative for vomiting, abdominal pain and diarrhea.  Endocrine: Negative for polydipsia and polyuria.  Genitourinary:  Negative for dysuria, frequency, difficulty urinating and pelvic pain.  Musculoskeletal: Negative for arthralgias, back pain, neck pain and neck stiffness.  Skin: Negative for color change and wound.  Allergic/Immunologic: Negative for immunocompromised state.  Neurological: Negative for facial asymmetry, weakness, numbness and headaches.  Hematological: Does not bruise/bleed easily.  Psychiatric/Behavioral: Negative for confusion and agitation.    10 Systems reviewed and are negative for acute change except as noted in the HPI.   Allergies  Tetanus toxoids  Home Medications   Prior to Admission medications   Medication Sig Start Date End Date Taking? Authorizing Provider  acetaminophen (TYLENOL) 500 MG tablet Take 1,000 mg by mouth every 6 (six) hours as needed. For pain    Historical Provider, MD  albuterol (PROVENTIL HFA;VENTOLIN HFA) 108 (90 BASE) MCG/ACT inhaler Inhale 2 puffs into the lungs every 6 (six) hours as needed. For wheezing      Historical Provider, MD  ALPRAZolam Prudy Feeler) 0.5 MG tablet Take 0.5 mg by mouth 2 (two) times daily as needed. For sleep or anxiety    Historical Provider, MD  amLODipine-benazepril (LOTREL) 10-20 MG per capsule Take 1 capsule by mouth daily.      Historical Provider, MD  baclofen (LIORESAL) 20 MG tablet Take 20 mg by mouth 3 (three) times daily.    Historical Provider, MD  carisoprodol (SOMA) 350 MG tablet Take 350 mg by mouth 2 (two) times daily.    Historical Provider, MD  carvedilol (COREG) 25 MG tablet Take 25 mg by mouth daily.     Historical Provider, MD  estrogens, conjugated, (PREMARIN) 1.25 MG tablet Take 1.25 mg by mouth daily.      Historical Provider, MD  ferrous sulfate 325 (65 FE) MG tablet Take 325 mg by mouth daily with breakfast.    Historical Provider, MD  furosemide (LASIX) 40 MG tablet Take 40 mg by mouth daily.     Historical Provider, MD  gabapentin (NEURONTIN) 800 MG tablet Take 800 mg by mouth 3 (three) times daily.       Historical Provider, MD  insulin NPH-insulin regular (NOVOLIN 70/30) (70-30) 100 UNIT/ML injection Inject 75 Units into the skin 2 (two) times daily.      Historical Provider, MD  nortriptyline (PAMELOR) 50 MG capsule Take 100 mg by mouth at bedtime.     Historical Provider, MD  omeprazole (PRILOSEC) 20 MG capsule Take 20 mg by mouth daily.      Historical Provider, MD  oxyCODONE-acetaminophen (PERCOCET) 10-325 MG per tablet Take 1 tablet by mouth 3 (three) times daily.    Historical Provider, MD  potassium chloride SA (K-DUR,KLOR-CON) 20 MEQ tablet Take 20 mEq by mouth daily.      Historical Provider, MD  PRESCRIPTION MEDICATION Take 1 tablet by mouth daily. Kidney medication to help clear suds from urine    Historical Provider, MD  quinapril (ACCUPRIL) 40 MG tablet Take 40 mg by mouth daily.    Historical Provider, MD  sertraline (ZOLOFT) 100 MG tablet Take 100 mg by mouth daily.      Historical Provider, MD  sevelamer (RENVELA) 800 MG tablet Take 800 mg by mouth daily.    Historical Provider, MD  sitaGLIPtin (JANUVIA) 100 MG tablet Take 100 mg by mouth daily.      Historical Provider, MD  venlafaxine XR (EFFEXOR-XR) 75 MG 24 hr capsule Take 75 mg by mouth 2 (two) times daily.    Historical Provider, MD  warfarin (COUMADIN) 1 MG tablet Take 5 mg by mouth once a week. Take 5 mg along with the 5 mg tablet to equal 10 mg on Wednesday    Historical Provider, MD  warfarin (COUMADIN) 5 MG tablet Take 5 mg by mouth daily. (except on Wednesday take additional 5 mg to equal 10 mg)    Historical Provider, MD   BP 111/73   Pulse 94   Temp(Src) 98.1 F (36.7 C) (Oral)   Resp 20   Ht 5\' 10"  (1.778 m)   Wt 251 lb (113.853 kg)   BMI 36.01 kg/m2   SpO2 96%  Physical Exam  Nursing note and vitals reviewed. Constitutional: She is oriented to person, place, and time. She appears well-developed and well-nourished. No distress.  HENT:  Head: Normocephalic.  Mouth/Throat: Oropharynx is clear and moist.  Eyes:  Pupils are equal, round, and reactive to light.  Neck: Neck supple.  Cardiovascular: Normal rate, regular rhythm and normal heart sounds.   Pulmonary/Chest: Effort normal and breath sounds normal. No respiratory distress. She has no wheezes.  Abdominal: Soft. She exhibits no distension. There is no tenderness. There is no rebound and no guarding.  Musculoskeletal: She exhibits no edema and no tenderness.  Neurological: She is alert and oriented to person, place, and time. She has normal strength. She displays no atrophy and no tremor. No cranial nerve deficit or sensory deficit. She exhibits normal muscle tone. She displays a negative Romberg sign. She displays no seizure activity. Coordination and gait normal. GCS eye subscore is 4. GCS verbal subscore is 5. GCS motor subscore is 6.  Skin: Skin is warm and dry.  Psychiatric: She has a normal mood and affect.    ED Course  Procedures (including critical care time) DIAGNOSTIC STUDIES: Oxygen Saturation is 96% on room air, normal by my interpretation.    COORDINATION OF CARE: 11:33 PM-Discussed treatment plan which includes EKG, CT head, and labs with pt at bedside and pt agreed to plan.   Labs Review Labs Reviewed  COMPREHENSIVE METABOLIC PANEL - Abnormal; Notable for the following:    Potassium 3.2 (*)    Total Protein 8.6 (*)    GFR calc non Af Amer 79 (*)    Anion gap 17 (*)    All other components within normal limits  PROTIME-INR - Abnormal; Notable for the following:    Prothrombin Time 30.9 (*)    INR 2.97 (*)    All other components within normal limits  URINE CULTURE  CBC WITH DIFFERENTIAL  URINALYSIS, ROUTINE W REFLEX MICROSCOPIC    Imaging Review Ct Head Wo Contrast  09/18/2013   CLINICAL DATA:  Dizziness. Bilateral hand and feet numbness for 3 days. Fall 1 week ago.  EXAM: CT HEAD WITHOUT CONTRAST  TECHNIQUE:  Contiguous axial images were obtained from the base of the skull through the vertex without intravenous  contrast.  COMPARISON:  02/14/2011  FINDINGS: Ventricles and sulci appear symmetrical. No mass effect or midline shift. No abnormal extra-axial fluid collections. Gray-white matter junctions are distinct. Basal cisterns are not effaced. No evidence of acute intracranial hemorrhage. No depressed skull fractures. Visualized paranasal sinuses and mastoid air cells are not opacified.  IMPRESSION: No acute intracranial abnormalities.   Electronically Signed   By: Burman Nieves M.D.   On: 09/18/2013 00:15     EKG Interpretation None      MDM   Final diagnoses:  Paresthesias in left hand  Paresthesias in right hand  Paresthesia of both feet    Pt is a 58 y.o. female with Pmhx as above who presents with about 1 week of intermittent paresthesias of BL hands & feet.  She had a fall about 1 week ago after falling asleep on the toilet and doesn't think she hit her head. +anticoagulant use.   On PE, VSS, pt in NAD. Neuro exam including strength & sensation in hands/feet is nml. CBC, CMP, pertinent for K 3.2 which was replaced. Glu nml. CT head nml. Doubt CVA/TIA, intracranial bleed. I feel she is safe to continue w/u as outpt w/ PCP. Return precautions given for new or worsening symptoms including focal weakness, confusion, fever, h/a.     I personally performed the services described in this documentation, which was scribed in my presence. The recorded information has been reviewed and is accurate.    Shanna Cisco, MD 09/18/13 [phone]

## 2013-09-17 NOTE — ED Notes (Signed)
Pt c/o bilat lower arm numbness. She sts it started in her left arm last week and tonight began in the lower right arm as well.

## 2013-09-18 LAB — URINE CULTURE
CULTURE: NO GROWTH
Colony Count: NO GROWTH

## 2013-09-18 LAB — CBC WITH DIFFERENTIAL/PLATELET
Basophils Absolute: 0.1 10*3/uL (ref 0.0–0.1)
Basophils Relative: 1 % (ref 0–1)
Eosinophils Absolute: 0.3 10*3/uL (ref 0.0–0.7)
Eosinophils Relative: 3 % (ref 0–5)
HEMATOCRIT: 38 % (ref 36.0–46.0)
HEMOGLOBIN: 13.3 g/dL (ref 12.0–15.0)
LYMPHS ABS: 2.4 10*3/uL (ref 0.7–4.0)
LYMPHS PCT: 30 % (ref 12–46)
MCH: 33.6 pg (ref 26.0–34.0)
MCHC: 35 g/dL (ref 30.0–36.0)
MCV: 96 fL (ref 78.0–100.0)
MONO ABS: 0.6 10*3/uL (ref 0.1–1.0)
MONOS PCT: 8 % (ref 3–12)
NEUTROS ABS: 4.8 10*3/uL (ref 1.7–7.7)
NEUTROS PCT: 59 % (ref 43–77)
Platelets: 331 10*3/uL (ref 150–400)
RBC: 3.96 MIL/uL (ref 3.87–5.11)
RDW: 13.9 % (ref 11.5–15.5)
WBC: 8.1 10*3/uL (ref 4.0–10.5)

## 2013-09-18 LAB — COMPREHENSIVE METABOLIC PANEL
ALK PHOS: 98 U/L (ref 39–117)
ALT: 12 U/L (ref 0–35)
ANION GAP: 17 — AB (ref 5–15)
AST: 25 U/L (ref 0–37)
Albumin: 4 g/dL (ref 3.5–5.2)
BILIRUBIN TOTAL: 0.6 mg/dL (ref 0.3–1.2)
BUN: 13 mg/dL (ref 6–23)
CHLORIDE: 96 meq/L (ref 96–112)
CO2: 28 meq/L (ref 19–32)
Calcium: 8.5 mg/dL (ref 8.4–10.5)
Creatinine, Ser: 0.8 mg/dL (ref 0.50–1.10)
GFR calc Af Amer: >90 mL/min (ref >90)
GFR, EST NON AFRICAN AMERICAN: 79 mL/min — AB (ref >90)
Glucose, Bld: 86 mg/dL (ref 70–99)
Potassium: 3.2 mEq/L — ABNORMAL LOW (ref 3.7–5.3)
Sodium: 141 mEq/L (ref 137–147)
Total Protein: 8.6 g/dL — ABNORMAL HIGH (ref 6.0–8.3)

## 2013-09-18 LAB — PROTIME-INR
INR: 2.97 — ABNORMAL HIGH (ref 0.00–1.49)
Prothrombin Time: 30.9 seconds — ABNORMAL HIGH (ref 11.6–15.2)

## 2013-09-18 MED ORDER — POTASSIUM CHLORIDE CRYS ER 20 MEQ PO TBCR
40.0000 meq | EXTENDED_RELEASE_TABLET | Freq: Once | ORAL | Status: AC
  Administered 2013-09-18: 40 meq via ORAL
  Filled 2013-09-18: qty 2

## 2013-09-18 NOTE — Discharge Instructions (Signed)

## 2013-09-21 NOTE — Procedures (Signed)
°

## 2013-12-22 ENCOUNTER — Emergency Department (HOSPITAL_BASED_OUTPATIENT_CLINIC_OR_DEPARTMENT_OTHER)
Admission: EM | Admit: 2013-12-22 | Discharge: 2013-12-22 | Discharge status: Against Medical Advice | Payer: Medicaid Other | Attending: Emergency Medicine | Admitting: Emergency Medicine

## 2013-12-22 ENCOUNTER — Encounter (HOSPITAL_BASED_OUTPATIENT_CLINIC_OR_DEPARTMENT_OTHER): Payer: Self-pay | Admitting: Emergency Medicine

## 2013-12-22 DIAGNOSIS — E119 Type 2 diabetes mellitus without complications: Secondary | ICD-10-CM | POA: Insufficient documentation

## 2013-12-22 DIAGNOSIS — I10 Essential (primary) hypertension: Secondary | ICD-10-CM | POA: Diagnosis not present

## 2013-12-22 DIAGNOSIS — M79606 Pain in leg, unspecified: Secondary | ICD-10-CM

## 2013-12-22 NOTE — ED Notes (Signed)
Left thigh pain for 2 weeks. She woke this am with swelling in her left foot. States she has a cold she cannot get rid of.

## 2013-12-22 NOTE — ED Notes (Signed)
Pt. Was seen by RN but left room before EDP in to see Pt.

## 2015-02-16 ENCOUNTER — Ambulatory Visit: Payer: Self-pay | Admitting: Podiatry

## 2016-04-17 ENCOUNTER — Encounter: Payer: Self-pay | Admitting: Internal Medicine

## 2016-11-19 ENCOUNTER — Encounter: Payer: Self-pay | Admitting: Internal Medicine

## 2016-11-19 ENCOUNTER — Ambulatory Visit (INDEPENDENT_AMBULATORY_CARE_PROVIDER_SITE_OTHER): Payer: Medicaid Other | Admitting: Internal Medicine

## 2016-11-19 VITALS — BP 148/84 | HR 93 | Ht 67.0 in | Wt 258.4 lb

## 2016-11-19 DIAGNOSIS — I519 Heart disease, unspecified: Secondary | ICD-10-CM | POA: Diagnosis not present

## 2016-11-19 DIAGNOSIS — I428 Other cardiomyopathies: Secondary | ICD-10-CM

## 2016-11-19 LAB — CUP PACEART INCLINIC DEVICE CHECK
Battery Remaining Longevity: 31 mo
Brady Statistic RV Percent Paced: 98 %
HighPow Impedance: 49.1094
Implantable Lead Implant Date: 20140429
Implantable Lead Location: 753858
Implantable Lead Location: 753860
Implantable Lead Model: 7121
Lead Channel Impedance Value: 362.5 Ohm
Lead Channel Impedance Value: 450 Ohm
Lead Channel Pacing Threshold Amplitude: 0.75 V
Lead Channel Pacing Threshold Amplitude: 0.75 V
Lead Channel Pacing Threshold Amplitude: 1.25 V
Lead Channel Pacing Threshold Amplitude: 1.25 V
Lead Channel Pacing Threshold Amplitude: 1.25 V
Lead Channel Pacing Threshold Pulse Width: 0.5 ms
Lead Channel Pacing Threshold Pulse Width: 0.5 ms
Lead Channel Pacing Threshold Pulse Width: 0.5 ms
Lead Channel Pacing Threshold Pulse Width: 0.5 ms
Lead Channel Sensing Intrinsic Amplitude: 3.7 mV
Lead Channel Setting Pacing Amplitude: 2 V
Lead Channel Setting Pacing Amplitude: 2.125
Lead Channel Setting Pacing Pulse Width: 0.5 ms
Lead Channel Setting Sensing Sensitivity: 0.5 mV
MDC IDC LEAD IMPLANT DT: 20140429
MDC IDC LEAD IMPLANT DT: 20140617
MDC IDC LEAD LOCATION: 753859
MDC IDC MSMT LEADCHNL LV IMPEDANCE VALUE: 625 Ohm
MDC IDC MSMT LEADCHNL LV PACING THRESHOLD PULSEWIDTH: 0.5 ms
MDC IDC MSMT LEADCHNL RV PACING THRESHOLD AMPLITUDE: 1.25 V
MDC IDC MSMT LEADCHNL RV PACING THRESHOLD PULSEWIDTH: 0.5 ms
MDC IDC MSMT LEADCHNL RV SENSING INTR AMPL: 12 mV
MDC IDC PG IMPLANT DT: 20140429
MDC IDC PG SERIAL: 7094184
MDC IDC SESS DTM: 20180910153641
MDC IDC SET LEADCHNL LV PACING AMPLITUDE: 2.125
MDC IDC SET LEADCHNL LV PACING PULSEWIDTH: 0.5 ms
MDC IDC STAT BRADY RA PERCENT PACED: 0 %

## 2016-11-19 NOTE — Patient Instructions (Addendum)
Medication Instructions:  Your physician recommends that you continue on your current medications as directed. Please refer to the Current Medication list given to you today.   Labwork: None ordered   Testing/Procedures: None ordered   Follow-Up: Remote monitoring is used to monitor your  ICD from home. This monitoring reduces the number of office visits required to check your device to one time per year. It allows [place] to keep an eye on the functioning of your device to ensure it is working properly. You are scheduled for a device check from home on 02/18/17. You may send your transmission at any time that day. If you have a wireless device, the transmission will be sent automatically. After your physician reviews your transmission, you will receive a postcard with your next transmission date.  Your physician wants you to follow-up in: 12 months with Amber Seiler,NP You will receive a reminder letter in the mail two months in advance. If you don't receive a letter, please call our office to schedule the follow-up appointment.     Any Other Special Instructions Will Be Listed Below (If Applicable).     If you need a refill on your cardiac medications before your next appointment, please call your pharmacy.

## 2016-11-19 NOTE — Progress Notes (Signed)
Donna Schaumann, MD: Primary Cardiologist: Dr Hanley Hays Primary EP:  Previously Dr Donna Hopkins is a 61 y.o. female with a h/o nonischemic CM and valvular heart disease sp BiV ICD ([employer] Jude) by Dr Chales Abrahams in [facility] who presents today to establish care in the Electrophysiology device clinic.  She has done very well since her ICD was implanted.  Her EF has recovered with CRT.  She is not very active. Today, she  denies symptoms of palpitations, chest pain, shortness of breath, orthopnea, PND, lower extremity edema, dizziness, presyncope, syncope, or neurologic sequela.  The patientis tolerating medications without difficulties and is otherwise without complaint today.   Past Medical History:  Diagnosis Date   Carotid stenosis, asymptomatic, bilateral    Chronic kidney disease, stage III (moderate)    Chronic low back pain    Clinical systolic heart failure, chronic (HCC)    Depression    Diabetes mellitus    Eosinophilic gastritis    GERD (gastroesophageal reflux disease)    Gout, renal disease    Hx of aortic valve replacement    Hypertension    Hypocalcemia    Hyponatremia    ICD (implantable cardioverter-defibrillator) in place    Medication refill    Mixed hyperlipidemia    Neuropathy    Obesity    OP (osteoporosis)    Peripheral vascular disease, unspecified (HCC)    Persistent insomnia    Personal history of colonic polyps    Pulmonary hypertension (HCC)    Snoring    declines sleep study   Tricuspid regurgitation    Uncontrolled type 2 diabetes mellitus with complication (HCC)    Valgus foot    Vertigo, benign positional    Vitamin D deficiency    Past Surgical History:  Procedure Laterality Date   ABDOMINAL HYSTERECTOMY     BACK SURGERY     BI-VENTRICULAR IMPLANTABLE CARDIOVERTER DEFIBRILLATOR  (CRT-D)  07/08/2012   SJM Quadra Assura BiV ICD implanted by Dr Reed Pandy at [facility] VALVE REPLACEMENT       Social History   Social History   Marital status: Single    Spouse name: N/A   Number of children: N/A   Years of education: N/A   Occupational History   Not on file.   Social History Main Topics   Smoking status: Former Smoker   Smokeless tobacco: Never Used   Alcohol use No   Drug use: No   Sexual activity: No   Other Topics Concern   Not on file   Social History Narrative   No narrative on file    Family History  Problem Relation Age of Onset   Heart disease Father    Hypertension Father    Anxiety disorder Sister    Diabetes Mellitus II Sister    Hypertension Sister    Colon polyps Sister    Hyperlipidemia Sister    Anxiety disorder Brother    Heart disease Brother    Depression Brother    Hyperlipidemia Brother     Allergies  Allergen Reactions   Canagliflozin Shortness Of Breath   Metformin Diarrhea   Tetanus Toxoids     unknown    Current Outpatient Prescriptions  Medication Sig Dispense Refill   acetaminophen (TYLENOL) 500 MG tablet Take 1,000 mg by mouth every 6 (six) hours as needed. For pain     albuterol (PROVENTIL HFA;VENTOLIN HFA) 108 (90 BASE) MCG/ACT inhaler Inhale 2 puffs into the lungs every 6 (  six) hours as needed. For wheezing       allopurinol (ZYLOPRIM) 300 MG tablet Take 300 mg by mouth daily.     ALPRAZolam (XANAX) 0.5 MG tablet Take 0.5 mg by mouth 2 (two) times daily as needed. For sleep or anxiety     amLODipine-benazepril (LOTREL) 10-20 MG per capsule Take 1 capsule by mouth daily.       atorvastatin (LIPITOR) 40 MG tablet Take 40 mg by mouth daily.     carvedilol (COREG) 25 MG tablet Take 25 mg by mouth 2 (two) times daily with a meal.      cetirizine (ZYRTEC) 10 MG tablet Take 10 mg by mouth daily.     cyclobenzaprine (FLEXERIL) 10 MG tablet Take 10 mg by mouth 3 (three) times daily as needed for muscle spasms.     doxepin (SINEQUAN) 50 MG capsule Take 50 mg by mouth at bedtime.      estrogens, conjugated, (PREMARIN) 1.25 MG tablet Take 1.25 mg by mouth daily.       Exenatide ER (BYDUREON) 2 MG SRER Inject into the skin as directed.      ferrous sulfate 325 (65 FE) MG tablet Take 325 mg by mouth daily with breakfast.     fluticasone (FLONASE) 50 MCG/ACT nasal spray Place 1 spray into both nostrils daily.     furosemide (LASIX) 40 MG tablet Take 40 mg by mouth daily.      gabapentin (NEURONTIN) 800 MG tablet Take 800 mg by mouth 3 (three) times daily.       insulin NPH-insulin regular (NOVOLIN 70/30) (70-30) 100 UNIT/ML injection Inject 75 Units into the skin 2 (two) times daily.       lidocaine (XYLOCAINE) 5 % ointment Apply 1 application topically as needed.     LYRICA 100 MG capsule Take 100 mg by mouth 3 (three) times daily.  5   metFORMIN (GLUCOPHAGE) 1000 MG tablet Take 1,000 mg by mouth 2 (two) times daily with a meal.     omeprazole (PRILOSEC) 20 MG capsule Take 20 mg by mouth daily.       oxyCODONE-acetaminophen (PERCOCET) 10-325 MG per tablet Take 1 tablet by mouth 3 (three) times daily.     potassium chloride SA (K-DUR,KLOR-CON) 20 MEQ tablet Take 20 mEq by mouth daily.       PRESCRIPTION MEDICATION Take 1 tablet by mouth daily. Kidney medication to help clear suds from urine     quinapril (ACCUPRIL) 40 MG tablet Take 40 mg by mouth daily.     ramipril (ALTACE) 10 MG capsule Take 10 mg by mouth daily.     sertraline (ZOLOFT) 100 MG tablet Take 100 mg by mouth daily.       spironolactone (ALDACTONE) 25 MG tablet Take 25 mg by mouth daily.     torsemide (DEMADEX) 20 MG tablet Take 20 mg by mouth daily.     triamcinolone cream (KENALOG) 0.1 % Apply 1 application topically 2 (two) times daily.     warfarin (COUMADIN) 6 MG tablet Take as directed by the coumadin clinic     No current facility-administered medications for this visit.     ROS- all systems are reviewed and negative except as per HPI  Physical Exam: Vitals:   11/19/16 1159  BP:  (!) 148/84  Pulse: 93  Weight: 258 lb 6.4 oz (117.2 kg)  Height:  (1.702 m)    GEN- The patient is overweight appearing, alert and oriented x 3 today.  Head- normocephalic, atraumatic Eyes-  Sclera clear, conjunctiva pink Ears- hearing intact Oropharynx- clear Neck- supple, no JVP Lungs- Clear to ausculation bilaterally, normal work of breathing Chest- BiV ICD  is well healed Heart- Regular rate and rhythm, mechanical S2 GI- soft, NT, ND, + BS Extremities- no clubbing, cyanosis, or edema MS- no significant deformity or atrophy Skin- no rash or lesion Psych- euthymic mood, full affect Neuro- strength and sensation are intact  BiV ICD interrogation- reviewed in detail today,  See PACEART report  Assessment and Plan:  1. Chronic systolic dysfunction/ valvular cardiomyopathy EF has normalized with CRT Normal biV ICD function See Pace Art report Monitor zone turned on today.  VF zone changed from 193 bpm to 214 bpm No other changes Will enroll in ICM device clinic with Randon Goldsmith  2. S/p AVR On coumadin Followed by Dr Hanley Hays  Follow-up with Dr Hanley Hays as scheduled Merlin Return to see EP NP in 1 year  Hillis Range MD, [facility] 11/19/2016 1:37 PM

## 2017-02-18 ENCOUNTER — Encounter: Payer: Medicaid Other | Admitting: *Deleted

## 2017-02-22 ENCOUNTER — Encounter: Payer: Self-pay | Admitting: Cardiology

## 2017-02-28 ENCOUNTER — Ambulatory Visit (INDEPENDENT_AMBULATORY_CARE_PROVIDER_SITE_OTHER): Payer: Medicaid Other | Admitting: *Deleted

## 2017-02-28 DIAGNOSIS — I428 Other cardiomyopathies: Secondary | ICD-10-CM

## 2017-02-28 NOTE — Progress Notes (Signed)
Remote ICD transmission.  ° °

## 2017-03-01 ENCOUNTER — Encounter: Payer: Self-pay | Admitting: Cardiology

## 2017-03-13 LAB — CUP PACEART REMOTE DEVICE CHECK
Battery Remaining Longevity: 28 mo
Battery Remaining Percentage: 37 %
Brady Statistic AP VP Percent: 1 %
Brady Statistic AP VS Percent: 1 %
Brady Statistic AS VP Percent: 97 %
Brady Statistic AS VS Percent: 2.6 %
Brady Statistic RA Percent Paced: 1 %
Date Time Interrogation Session: 20181220073016
HighPow Impedance: 46 Ohm
HighPow Impedance: 46 Ohm
Implantable Lead Implant Date: 20140429
Implantable Lead Implant Date: 20140429
Implantable Lead Location: 753858
Implantable Lead Location: 753859
Lead Channel Impedance Value: 350 Ohm
Lead Channel Pacing Threshold Amplitude: 1.125 V
Lead Channel Pacing Threshold Amplitude: 1.25 V
Lead Channel Pacing Threshold Pulse Width: 0.5 ms
Lead Channel Pacing Threshold Pulse Width: 0.5 ms
Lead Channel Sensing Intrinsic Amplitude: 2.6 mV
Lead Channel Setting Pacing Amplitude: 2 V
Lead Channel Setting Pacing Amplitude: 2.125
Lead Channel Setting Pacing Pulse Width: 0.5 ms
Lead Channel Setting Sensing Sensitivity: 0.5 mV
MDC IDC LEAD IMPLANT DT: 20140617
MDC IDC LEAD LOCATION: 753860
MDC IDC MSMT BATTERY VOLTAGE: 2.92 V
MDC IDC MSMT LEADCHNL LV IMPEDANCE VALUE: 560 Ohm
MDC IDC MSMT LEADCHNL RA IMPEDANCE VALUE: 430 Ohm
MDC IDC MSMT LEADCHNL RA PACING THRESHOLD AMPLITUDE: 0.75 V
MDC IDC MSMT LEADCHNL RA PACING THRESHOLD PULSEWIDTH: 0.5 ms
MDC IDC MSMT LEADCHNL RV SENSING INTR AMPL: 12 mV
MDC IDC PG IMPLANT DT: 20140429
MDC IDC SET LEADCHNL LV PACING PULSEWIDTH: 0.5 ms
MDC IDC SET LEADCHNL RV PACING AMPLITUDE: 2.25 V
Pulse Gen Serial Number: 7094184

## 2017-05-30 ENCOUNTER — Telehealth: Payer: Self-pay | Admitting: Cardiology

## 2017-05-30 ENCOUNTER — Ambulatory Visit (INDEPENDENT_AMBULATORY_CARE_PROVIDER_SITE_OTHER): Payer: Medicaid Other | Admitting: *Deleted

## 2017-05-30 DIAGNOSIS — I428 Other cardiomyopathies: Secondary | ICD-10-CM | POA: Diagnosis not present

## 2017-05-30 NOTE — Telephone Encounter (Signed)
Attempted to confirm remote transmission with pt. No answer and was unable to leave a message.

## 2017-05-31 ENCOUNTER — Encounter: Payer: Self-pay | Admitting: Cardiology

## 2017-05-31 NOTE — Progress Notes (Signed)
Remote ICD transmission.  ° °

## 2017-08-29 ENCOUNTER — Telehealth: Payer: Self-pay | Admitting: Cardiology

## 2017-08-29 ENCOUNTER — Encounter: Payer: Medicaid Other | Admitting: *Deleted

## 2017-08-29 NOTE — Telephone Encounter (Signed)
Attempted to confirm remote transmission with pt. No answer and was unable to leave a message. Automated message stating that the call could not be completed as dialed.

## 2017-08-30 ENCOUNTER — Encounter: Payer: Self-pay | Admitting: Cardiology

## 2017-09-09 ENCOUNTER — Ambulatory Visit (INDEPENDENT_AMBULATORY_CARE_PROVIDER_SITE_OTHER): Payer: Medicaid Other | Admitting: *Deleted

## 2017-09-09 DIAGNOSIS — I428 Other cardiomyopathies: Secondary | ICD-10-CM

## 2017-09-10 NOTE — Progress Notes (Signed)
Remote ICD transmission.  ° °

## 2017-09-11 ENCOUNTER — Encounter: Payer: Self-pay | Admitting: Cardiology

## 2017-09-28 LAB — CUP PACEART REMOTE DEVICE CHECK
Battery Remaining Longevity: 23 mo
Brady Statistic AP VP Percent: 1 %
Brady Statistic AP VS Percent: 1 %
Brady Statistic AS VP Percent: 97 %
Brady Statistic AS VS Percent: 2.6 %
Brady Statistic RA Percent Paced: 1 %
Date Time Interrogation Session: 20190701062228
HighPow Impedance: 43 Ohm
HighPow Impedance: 44 Ohm
Implantable Lead Implant Date: 20140429
Implantable Lead Implant Date: 20140429
Implantable Lead Location: 753858
Implantable Lead Location: 753859
Implantable Lead Location: 753860
Lead Channel Impedance Value: 450 Ohm
Lead Channel Pacing Threshold Amplitude: 0.75 V
Lead Channel Pacing Threshold Amplitude: 1.375 V
Lead Channel Pacing Threshold Amplitude: 1.5 V
Lead Channel Pacing Threshold Pulse Width: 0.5 ms
Lead Channel Pacing Threshold Pulse Width: 0.5 ms
Lead Channel Sensing Intrinsic Amplitude: 3.4 mV
Lead Channel Setting Pacing Amplitude: 2 V
Lead Channel Setting Pacing Amplitude: 2.375
Lead Channel Setting Pacing Pulse Width: 0.5 ms
Lead Channel Setting Sensing Sensitivity: 0.5 mV
MDC IDC LEAD IMPLANT DT: 20140617
MDC IDC MSMT BATTERY REMAINING PERCENTAGE: 29 %
MDC IDC MSMT BATTERY VOLTAGE: 2.9 V
MDC IDC MSMT LEADCHNL LV IMPEDANCE VALUE: 610 Ohm
MDC IDC MSMT LEADCHNL RA PACING THRESHOLD PULSEWIDTH: 0.5 ms
MDC IDC MSMT LEADCHNL RV IMPEDANCE VALUE: 510 Ohm
MDC IDC MSMT LEADCHNL RV SENSING INTR AMPL: 12 mV
MDC IDC PG IMPLANT DT: 20140429
MDC IDC SET LEADCHNL LV PACING AMPLITUDE: 2.5 V
MDC IDC SET LEADCHNL LV PACING PULSEWIDTH: 0.5 ms
Pulse Gen Serial Number: 7094184

## 2017-12-09 ENCOUNTER — Ambulatory Visit (INDEPENDENT_AMBULATORY_CARE_PROVIDER_SITE_OTHER): Payer: Medicaid Other | Admitting: *Deleted

## 2017-12-09 DIAGNOSIS — I428 Other cardiomyopathies: Secondary | ICD-10-CM

## 2017-12-09 DIAGNOSIS — I519 Heart disease, unspecified: Secondary | ICD-10-CM

## 2017-12-09 NOTE — Progress Notes (Signed)
Remote ICD transmission.  ° °

## 2017-12-25 ENCOUNTER — Telehealth: Payer: Self-pay

## 2017-12-25 NOTE — Telephone Encounter (Signed)
Attempted to call pt at all numbers provided by [facility] system including pts daughter, in order to discuss RV lead alert from pts ICD. All numbers were disconnected or unable to leave a voicemail. Also attempted to call pt contact numbers listed under office visit with Pain Management clinic at [facility] from date 08/21/17. At [phone] number attempted to leave voicemail but was unable. Also attempted the emergency contact number listed for Donna Hopkins this was a wrong number. Also contacted pts PCP for different contact number, they did not have any additional contact numbers for pt. Will continue to try and reach pt via the [phone] home phone for which voicemail indicated that this was pts phone.

## 2017-12-25 NOTE — Telephone Encounter (Signed)
2nd attempt to reach pt at only number available, no answer unable to leave voicemail.

## 2017-12-26 ENCOUNTER — Encounter: Payer: Self-pay | Admitting: *Deleted

## 2017-12-26 NOTE — Telephone Encounter (Signed)
Spoke with Dr. Johney Frame about a plan for Donna Hopkins if I could reach her today. Turn off tachy therapies to prevent inappropriate shocks (no history of ICD therapy, improved EF), 2V CXR, follow- up with Dr. Johney Frame in office on Monday. If I am unable to reach Donna Hopkins today I will send a certified letter indicating that she needs urgent follow-up related to her ICD.  No answer, VM full.

## 2017-12-26 NOTE — Telephone Encounter (Signed)
Unable to reach patient 12/25/17 and 12/26/17. Certified letter printed and ready to be mailed out today. Industry rep Bonna Gains aware

## 2017-12-30 NOTE — Telephone Encounter (Signed)
Attempted to call Home # x 2 - unable to LM, mailbox is full.  Attempted to call Mobile # x1 - mobile number is not a working number

## 2018-01-01 ENCOUNTER — Ambulatory Visit: Payer: Medicaid Other | Admitting: Internal Medicine

## 2018-01-01 ENCOUNTER — Encounter: Payer: Self-pay | Admitting: Internal Medicine

## 2018-01-01 ENCOUNTER — Telehealth: Payer: Self-pay

## 2018-01-01 ENCOUNTER — Ambulatory Visit
Admission: RE | Admit: 2018-01-01 | Discharge: 2018-01-01 | Disposition: A | Discharge status: Home / Self Care | Payer: Medicaid Other | Source: Ambulatory Visit | Attending: Internal Medicine | Admitting: Internal Medicine

## 2018-01-01 VITALS — BP 136/86 | HR 101 | Ht 67.0 in | Wt 247.0 lb

## 2018-01-01 DIAGNOSIS — I428 Other cardiomyopathies: Secondary | ICD-10-CM | POA: Diagnosis not present

## 2018-01-01 DIAGNOSIS — I519 Heart disease, unspecified: Secondary | ICD-10-CM

## 2018-01-01 DIAGNOSIS — Z95810 Presence of automatic (implantable) cardiac defibrillator: Secondary | ICD-10-CM

## 2018-01-01 NOTE — Telephone Encounter (Signed)
Spoke with pt informed her that is it was possible that her RV lead may be malfunctioning, pt sounded groggy on the phone but voiced understanding of getting chest x ray and seeing Dr. Johney Frame today to discuss. Directions given to [facility] and address to [address]. Pt agreeable to apt today at 4:00pm with Dr. Johney Frame.

## 2018-01-01 NOTE — Patient Instructions (Addendum)
Medication Instructions:  Your physician recommends that you continue on your current medications as directed. Please refer to the Current Medication list given to you today.  If you need a refill on your cardiac medications before your next appointment, please call your pharmacy.   Lab work: None ordered.  If you have labs (blood work) drawn today and your tests are completely normal, you will receive your results only by:  MyChart Message (if you have MyChart) OR  A paper copy in the mail If you have any lab test that is abnormal or we need to change your treatment, we will call you to review the results.  Testing/Procedures: Your physician has requested that you have an echocardiogram. Echocardiography is a painless test that uses sound waves to create images of your heart. It provides your doctor with information about the size and shape of your heart and how well your hearts chambers and valves are working. This procedure takes approximately one hour. There are no restrictions for this procedure.  Please schedule for an ECHO prior to next appointment January 17, 2018 (not same day)  Follow-Up:  January 17, 2018 at 12:15 pm--  Remote monitoring is used to monitor your ICD from home. This monitoring reduces the number of office visits required to check your device to one time per year. It allows [place] to keep an eye on the functioning of your device to ensure it is working properly. You are scheduled for a device check from home on 03/10/2018. You may send your transmission at any time that day. If you have a wireless device, the transmission will be sent automatically. After your physician reviews your transmission, you will receive a postcard with your next transmission date.  At [facility], you and your health needs are our priority.  As part of our continuing mission to provide you with exceptional heart care, we have created designated Provider Care Teams.  These Care Teams  include your primary Cardiologist (physician) and Advanced Practice Providers (APPs -  Physician Assistants and Nurse Practitioners) who all work together to provide you with the care you need, when you need it.  Any Other Special Instructions Will Be Listed Below (If Applicable).

## 2018-01-01 NOTE — Progress Notes (Signed)
PCP: Dennard Schaumann, MD Primary Cardiologist: Dr Hanley Hays Primary EP: Dr Johney Frame  Donna Hopkins is a 62 y.o. female who presents today for electrophysiology followup. Her ICD RV lead impedance has reached > 2000 Ohms by remote.  She has been asked to come into our office for assessment.  She reports some SOB recently which she attributes to "pain medicine".  She is having trouble with pain in her L leg.   Since last being seen in our clinic, the patient reports doing very well.  Today, she denies symptoms of palpitations, chest pain,  lower extremity edema, dizziness, presyncope, syncope, or ICD shocks.  The patient is otherwise without complaint today.   Past Medical History:  Diagnosis Date   Carotid stenosis, asymptomatic, bilateral    Chronic kidney disease, stage III (moderate) (HCC)    Chronic low back pain    Clinical systolic heart failure, chronic (HCC)    Depression    Diabetes mellitus    Eosinophilic gastritis    GERD (gastroesophageal reflux disease)    Gout, renal disease    H/O mechanical aortic valve replacement    on coumadin   Hx of aortic valve replacement    Hypertension    Hypocalcemia    Hyponatremia    ICD (implantable cardioverter-defibrillator) in place    Medication refill    Mixed hyperlipidemia    Neuropathy    Obesity    OP (osteoporosis)    Peripheral vascular disease, unspecified (HCC)    Persistent insomnia    Personal history of colonic polyps    Pulmonary hypertension (HCC)    Snoring    declines sleep study   Tricuspid regurgitation    Uncontrolled type 2 diabetes mellitus with complication (HCC)    Valgus foot    Vertigo, benign positional    Vitamin D deficiency    Past Surgical History:  Procedure Laterality Date   ABDOMINAL HYSTERECTOMY     BACK SURGERY     BI-VENTRICULAR IMPLANTABLE CARDIOVERTER DEFIBRILLATOR  (CRT-D)  07/08/2012   SJM Quadra Assura BiV ICD implanted by Dr Reed Pandy at [facility]   CARDIAC VALVE REPLACEMENT      ROS- all systems are reviewed and negative except as per HPI above  Current Outpatient Medications  Medication Sig Dispense Refill   acetaminophen (TYLENOL) 500 MG tablet Take 1,000 mg by mouth every 6 (six) hours as needed. For pain     albuterol (PROVENTIL HFA;VENTOLIN HFA) 108 (90 BASE) MCG/ACT inhaler Inhale 2 puffs into the lungs every 6 (six) hours as needed. For wheezing       allopurinol (ZYLOPRIM) 300 MG tablet Take 300 mg by mouth daily.     ALPRAZolam (XANAX) 0.5 MG tablet Take 0.5 mg by mouth 2 (two) times daily as needed. For sleep or anxiety     amLODipine-benazepril (LOTREL) 10-20 MG per capsule Take 1 capsule by mouth daily.       atorvastatin (LIPITOR) 40 MG tablet Take 40 mg by mouth daily.     carvedilol (COREG) 25 MG tablet Take 25 mg by mouth 2 (two) times daily with a meal.      cetirizine (ZYRTEC) 10 MG tablet Take 10 mg by mouth daily.     cyclobenzaprine (FLEXERIL) 10 MG tablet Take 10 mg by mouth 3 (three) times daily as needed for muscle spasms.     doxepin (SINEQUAN) 50 MG capsule Take 50 mg by mouth at bedtime.     estrogens, conjugated, (PREMARIN) 1.25 MG tablet  Take 1.25 mg by mouth daily.       Exenatide ER (BYDUREON) 2 MG SRER Inject into the skin as directed.      ferrous sulfate 325 (65 FE) MG tablet Take 325 mg by mouth daily with breakfast.     fluticasone (FLONASE) 50 MCG/ACT nasal spray Place 1 spray into both nostrils daily.     furosemide (LASIX) 40 MG tablet Take 40 mg by mouth daily.      gabapentin (NEURONTIN) 800 MG tablet Take 800 mg by mouth 3 (three) times daily.       insulin NPH-insulin regular (NOVOLIN 70/30) (70-30) 100 UNIT/ML injection Inject 75 Units into the skin 2 (two) times daily.       lidocaine (XYLOCAINE) 5 % ointment Apply 1 application topically as needed.     LYRICA 100 MG capsule Take 100 mg by mouth 3 (three) times daily.  5   metFORMIN (GLUCOPHAGE) 1000 MG tablet  Take 1,000 mg by mouth 2 (two) times daily with a meal.     omeprazole (PRILOSEC) 20 MG capsule Take 20 mg by mouth daily.       oxyCODONE-acetaminophen (PERCOCET) 10-325 MG per tablet Take 1 tablet by mouth 3 (three) times daily.     OZEMPIC, 0.25 OR 0.5 MG/DOSE, 2 MG/1.5ML SOPN Inject 1 Dose into the skin once a week.  3   potassium chloride SA (K-DUR,KLOR-CON) 20 MEQ tablet Take 20 mEq by mouth daily.       PRESCRIPTION MEDICATION Take 1 tablet by mouth daily. Kidney medication to help clear suds from urine     quinapril (ACCUPRIL) 40 MG tablet Take 40 mg by mouth daily.     ramipril (ALTACE) 10 MG capsule Take 10 mg by mouth daily.     sertraline (ZOLOFT) 100 MG tablet Take 100 mg by mouth daily.       spironolactone (ALDACTONE) 25 MG tablet Take 25 mg by mouth daily.     torsemide (DEMADEX) 20 MG tablet Take 20 mg by mouth daily.     triamcinolone cream (KENALOG) 0.1 % Apply 1 application topically 2 (two) times daily.     Vitamin D, Ergocalciferol, (DRISDOL) 50000 units CAPS capsule Take 1 capsule by mouth once a week.     warfarin (COUMADIN) 6 MG tablet Take as directed by the coumadin clinic     No current facility-administered medications for this visit.     Physical Exam: Vitals:   01/01/18 1629  BP: 136/86  Pulse: (!) 101  SpO2: 98%  Weight: 247 lb (112 kg)  Height: 5\' 7"  (1.702 m)    GEN- The patient is well appearing, alert and oriented x 3 today.   Head- normocephalic, atraumatic Eyes-  Sclera clear, conjunctiva pink Ears- hearing intact Oropharynx- clear Lungs- Clear to ausculation bilaterally, normal work of breathing Chest- ICD pocket is well healed Heart- Regular rate and rhythm, mechanical S2 GI- soft, NT, ND, + BS Extremities- no clubbing, cyanosis, or edema  ICD interrogation- reviewed in detail today,  See PACEART report  ekg tracing ordered today is personally reviewed and shows sinus with BiV pacing  Wt Readings from Last 3 Encounters:   01/01/18 247 lb (112 kg)  11/19/16 258 lb 6.4 oz (117.2 kg)  12/22/13 246 lb (111.6 kg)    Assessment and Plan:  1.  Chronic systolic dysfunction/ ICD lead fracture euvolemic today Stable on an appropriate medical regimen EF previously 50-55% Will repeat echo ICD interrogation reveals RV lead fracture with  elevated threshold and impedance.  CXR reveals no obvious issue. For now, I will turn tachy detections off.  Of note, her EF has normalized with CRT previously and she has never had appropriate therapy. We discussed options at length today. For now, we will obtain an echo for further risk stratification.  I have programmed RV lead subthreshold as threshold is quite high.  She will return in 2 weeks.  I did offer options of new ICD lead placement, extraction with new lead, continuing without replacement with tachy therapies turned off, and also downgrade to pacing with LV lead in the RV port.  She will think about options and return in 2 weeks for further assessment.  We discussed risks and benefits of each option at length today.  2. S/p AVR  Followed by Dr Art Buff MD, [facility] 01/01/2018 4:42 PM

## 2018-01-02 ENCOUNTER — Other Ambulatory Visit: Payer: Self-pay | Admitting: Internal Medicine

## 2018-01-02 LAB — CUP PACEART REMOTE DEVICE CHECK
Date Time Interrogation Session: 20191024132518
Implantable Lead Implant Date: 20140429
Implantable Lead Location: 753859
Implantable Lead Location: 753860
Implantable Pulse Generator Implant Date: 20140429
Lead Channel Setting Pacing Amplitude: 2 V
Lead Channel Setting Pacing Amplitude: 5 V
Lead Channel Setting Sensing Sensitivity: 0.5 mV
MDC IDC LEAD IMPLANT DT: 20140429
MDC IDC LEAD IMPLANT DT: 20140617
MDC IDC LEAD LOCATION: 753858
MDC IDC PG SERIAL: 7094184
MDC IDC SET LEADCHNL LV PACING AMPLITUDE: 2.5 V
MDC IDC SET LEADCHNL LV PACING PULSEWIDTH: 0.5 ms
MDC IDC SET LEADCHNL RV PACING PULSEWIDTH: 0.5 ms

## 2018-01-02 LAB — CUP PACEART INCLINIC DEVICE CHECK
Brady Statistic RA Percent Paced: 0 %
Brady Statistic RV Percent Paced: 98 %
Date Time Interrogation Session: 20191023203827
HighPow Impedance: 49.3594
Implantable Lead Implant Date: 20140429
Implantable Lead Implant Date: 20140617
Implantable Lead Location: 753858
Implantable Lead Location: 753860
Implantable Lead Model: 7121
Implantable Pulse Generator Implant Date: 20140429
Lead Channel Impedance Value: 450 Ohm
Lead Channel Pacing Threshold Amplitude: 1 V
Lead Channel Pacing Threshold Amplitude: 4.75 V
Lead Channel Pacing Threshold Amplitude: 4.75 V
Lead Channel Pacing Threshold Pulse Width: 0.5 ms
Lead Channel Pacing Threshold Pulse Width: 0.5 ms
Lead Channel Pacing Threshold Pulse Width: 0.5 ms
Lead Channel Pacing Threshold Pulse Width: 1 ms
Lead Channel Pacing Threshold Pulse Width: 1 ms
Lead Channel Sensing Intrinsic Amplitude: 12 mV
Lead Channel Sensing Intrinsic Amplitude: 3.9 mV
Lead Channel Setting Pacing Amplitude: 2.625
Lead Channel Setting Pacing Pulse Width: 0.05 ms
Lead Channel Setting Pacing Pulse Width: 0.5 ms
Lead Channel Setting Sensing Sensitivity: 0.5 mV
MDC IDC LEAD IMPLANT DT: 20140429
MDC IDC LEAD LOCATION: 753859
MDC IDC MSMT BATTERY REMAINING LONGEVITY: 26 mo
MDC IDC MSMT LEADCHNL LV IMPEDANCE VALUE: 612.5 Ohm
MDC IDC MSMT LEADCHNL LV PACING THRESHOLD AMPLITUDE: 1.625 V
MDC IDC MSMT LEADCHNL RA PACING THRESHOLD AMPLITUDE: 1 V
MDC IDC MSMT LEADCHNL RV IMPEDANCE VALUE: 2037.5 Ohm
MDC IDC PG SERIAL: 7094184
MDC IDC SET LEADCHNL RA PACING AMPLITUDE: 2 V
MDC IDC SET LEADCHNL RV PACING AMPLITUDE: 0.25 V

## 2018-01-14 ENCOUNTER — Other Ambulatory Visit (HOSPITAL_COMMUNITY): Payer: Medicaid Other

## 2018-01-17 ENCOUNTER — Encounter: Payer: Self-pay | Admitting: Internal Medicine

## 2018-01-17 ENCOUNTER — Ambulatory Visit: Payer: Medicaid Other | Admitting: Internal Medicine

## 2018-01-17 VITALS — BP 146/80 | HR 98 | Ht 67.0 in | Wt 251.4 lb

## 2018-01-17 DIAGNOSIS — I428 Other cardiomyopathies: Secondary | ICD-10-CM | POA: Diagnosis not present

## 2018-01-17 DIAGNOSIS — I519 Heart disease, unspecified: Secondary | ICD-10-CM | POA: Diagnosis not present

## 2018-01-17 NOTE — Patient Instructions (Signed)
Medication Instructions:  Your physician recommends that you continue on your current medications as directed. Please refer to the Current Medication list given to you today.  If you need a refill on your cardiac medications before your next appointment, please call your pharmacy.   Lab work: None ordered  Testing/Procedures: Your physician has requested that you have an echocardiogram. Echocardiography is a painless test that uses sound waves to create images of your heart. It provides your doctor with information about the size and shape of your heart and how well your hearts chambers and valves are working. This procedure takes approximately one hour. There are no restrictions for this procedure.  Follow-Up: At [facility], you and your health needs are our priority.  As part of our continuing mission to provide you with exceptional heart care, we have created designated Provider Care Teams.  These Care Teams include your primary Cardiologist (physician) and Advanced Practice Providers (APPs -  Physician Assistants and Nurse Practitioners) who all work together to provide you with the care you need, when you need it.  You will need a follow up appointment in January 2020 with Dr. Johney Frame.  Thank you for choosing [facility]!!

## 2018-01-17 NOTE — Addendum Note (Signed)
Addended by: Baird Lyons on: 01/17/2018 11:01 AM   Modules accepted: Orders

## 2018-01-17 NOTE — Progress Notes (Signed)
PCP: Dennard Schaumann, MD Primary Cardiologist: Dr Hanley Hays Primary EP: Dr Johney Frame  Donna Hopkins is a 62 y.o. female who presents today for electrophysiology followup.  Since last being seen in our clinic, the patient reports doing reasonably well.  She has occasional SOB but does not feel that this has worsened since turning her RV lead off.  Today, she denies symptoms of palpitations, chest pain,  lower extremity edema, dizziness, presyncope, syncope, or ICD shocks.  The patient is otherwise without complaint today.   Past Medical History:  Diagnosis Date  . Carotid stenosis, asymptomatic, bilateral   . Chronic kidney disease, stage III (moderate) (HCC)   . Chronic low back pain   . Clinical systolic heart failure, chronic (HCC)   . Depression   . Diabetes mellitus   . Eosinophilic gastritis   . GERD (gastroesophageal reflux disease)   . Gout, renal disease   . H/O mechanical aortic valve replacement    on coumadin  . Hx of aortic valve replacement   . Hypertension   . Hypocalcemia   . Hyponatremia   . ICD (implantable cardioverter-defibrillator) in place   . Medication refill   . Mixed hyperlipidemia   . Neuropathy   . Obesity   . OP (osteoporosis)   . Peripheral vascular disease, unspecified (HCC)   . Persistent insomnia   . Personal history of colonic polyps   . Pulmonary hypertension (HCC)   . Snoring    declines sleep study  . Tricuspid regurgitation   . Uncontrolled type 2 diabetes mellitus with complication (HCC)   . Valgus foot   . Vertigo, benign positional   . Vitamin D deficiency    Past Surgical History:  Procedure Laterality Date  . ABDOMINAL HYSTERECTOMY    . BACK SURGERY    . BI-VENTRICULAR IMPLANTABLE CARDIOVERTER DEFIBRILLATOR  (CRT-D)  07/08/2012   SJM Quadra Assura BiV ICD implanted by Dr Reed Pandy at [facility]  . CARDIAC VALVE REPLACEMENT      ROS- all systems are reviewed and negative except as per HPI above  Current Outpatient Medications   Medication Sig Dispense Refill  . acetaminophen (TYLENOL) 500 MG tablet Take 1,000 mg by mouth every 6 (six) hours as needed. For pain    . albuterol (PROVENTIL HFA;VENTOLIN HFA) 108 (90 BASE) MCG/ACT inhaler Inhale 2 puffs into the lungs every 6 (six) hours as needed. For wheezing      . allopurinol (ZYLOPRIM) 300 MG tablet Take 300 mg by mouth daily.    Marland Kitchen ALPRAZolam (XANAX) 0.5 MG tablet Take 0.5 mg by mouth 2 (two) times daily as needed. For sleep or anxiety    . amLODipine-benazepril (LOTREL) 10-20 MG per capsule Take 1 capsule by mouth daily.      Marland Kitchen atorvastatin (LIPITOR) 40 MG tablet Take 40 mg by mouth daily.    . carvedilol (COREG) 25 MG tablet Take 25 mg by mouth 2 (two) times daily with a meal.     . cetirizine (ZYRTEC) 10 MG tablet Take 10 mg by mouth daily.    . cyclobenzaprine (FLEXERIL) 10 MG tablet Take 10 mg by mouth 3 (three) times daily as needed for muscle spasms.    Marland Kitchen doxepin (SINEQUAN) 50 MG capsule Take 50 mg by mouth at bedtime.    Marland Kitchen estrogens, conjugated, (PREMARIN) 1.25 MG tablet Take 1.25 mg by mouth daily.      . Exenatide ER (BYDUREON) 2 MG SRER Inject into the skin as directed.     Marland Kitchen  ferrous sulfate 325 (65 FE) MG tablet Take 325 mg by mouth daily with breakfast.    . fluticasone (FLONASE) 50 MCG/ACT nasal spray Place 1 spray into both nostrils daily.    . furosemide (LASIX) 40 MG tablet Take 40 mg by mouth daily.     Marland Kitchen gabapentin (NEURONTIN) 800 MG tablet Take 800 mg by mouth 3 (three) times daily.      . insulin NPH-insulin regular (NOVOLIN 70/30) (70-30) 100 UNIT/ML injection Inject 75 Units into the skin 2 (two) times daily.      Marland Kitchen lidocaine (XYLOCAINE) 5 % ointment Apply 1 application topically as needed.    Marland Kitchen LYRICA 100 MG capsule Take 100 mg by mouth 3 (three) times daily.  5  . metFORMIN (GLUCOPHAGE) 1000 MG tablet Take 1,000 mg by mouth 2 (two) times daily with a meal.    . omeprazole (PRILOSEC) 20 MG capsule Take 20 mg by mouth daily.      Marland Kitchen  oxyCODONE-acetaminophen (PERCOCET) 10-325 MG per tablet Take 1 tablet by mouth 3 (three) times daily.    Marland Kitchen OZEMPIC, 0.25 OR 0.5 MG/DOSE, 2 MG/1.5ML SOPN Inject 1 Dose into the skin once a week.  3  . potassium chloride SA (K-DUR,KLOR-CON) 20 MEQ tablet Take 20 mEq by mouth daily.      Marland Kitchen PRESCRIPTION MEDICATION Take 1 tablet by mouth daily. Kidney medication to help clear suds from urine    . quinapril (ACCUPRIL) 40 MG tablet Take 40 mg by mouth daily.    . ramipril (ALTACE) 10 MG capsule Take 10 mg by mouth daily.    . sertraline (ZOLOFT) 100 MG tablet Take 100 mg by mouth daily.      Marland Kitchen spironolactone (ALDACTONE) 25 MG tablet Take 25 mg by mouth daily.    Marland Kitchen torsemide (DEMADEX) 20 MG tablet Take 20 mg by mouth daily.    Marland Kitchen triamcinolone cream (KENALOG) 0.1 % Apply 1 application topically 2 (two) times daily.    . Vitamin D, Ergocalciferol, (DRISDOL) 50000 units CAPS capsule Take 1 capsule by mouth once a week.    . warfarin (COUMADIN) 6 MG tablet Take as directed by the coumadin clinic     No current facility-administered medications for this visit.     Physical Exam: Vitals:   01/17/18 1037  BP: (!) 146/80  Pulse: 98  SpO2: 99%  Weight: 251 lb 6.4 oz (114 kg)  Height: 5\' 7"  (1.702 m)    GEN- The patient is well appearing, alert and oriented x 3 today.   Head- normocephalic, atraumatic Eyes-  Sclera clear, conjunctiva pink Ears- hearing intact Oropharynx- clear Lungs- Clear to ausculation bilaterally, normal work of breathing Chest- ICD pocket is well healed Heart- Regular rate and rhythm, no murmurs, rubs or gallops, PMI not laterally displaced GI- soft, NT, ND, + BS Extremities- no clubbing, cyanosis, or edema  ICD interrogation- reviewed in detail today,  See PACEART report    Wt Readings from Last 3 Encounters:  01/17/18 251 lb 6.4 oz (114 kg)  01/01/18 247 lb (112 kg)  11/19/16 258 lb 6.4 oz (117.2 kg)    Assessment and Plan:  1.  Chronic systolic dysfunction/ ICD  lead failure euvolemic today RV lead threshold is elevated and impedance suggests lead failure. EF 50-55% with CRT.  I ordered repeat echo but she has not yet had this done.  Currently, she declines lead revision.  She is aware that tachy therapies are programmed off.  She feels well with LV  only pacing.  She states that she may be more willing to reconsider "after the holidays".  Stable on an appropriate medical regimen See Pace Art report No changes today  I will obtain echo to evaluate EF.  Return to see me in 2 months after the holidays per her preference.  She will contact my office if she decides to proceed with lead revision in the interim.  2. S/p AVR Followed by Dr Art Buff MD, [facility] 01/17/2018 10:42 AM

## 2018-01-27 ENCOUNTER — Ambulatory Visit (HOSPITAL_COMMUNITY): Payer: Medicaid Other

## 2018-02-04 ENCOUNTER — Ambulatory Visit (HOSPITAL_COMMUNITY): Payer: Medicaid Other | Attending: Cardiovascular Disease

## 2018-02-04 ENCOUNTER — Encounter (HOSPITAL_COMMUNITY): Payer: Self-pay

## 2018-02-04 ENCOUNTER — Encounter (INDEPENDENT_AMBULATORY_CARE_PROVIDER_SITE_OTHER): Payer: Self-pay

## 2018-02-04 ENCOUNTER — Other Ambulatory Visit: Payer: Self-pay

## 2018-02-04 DIAGNOSIS — I519 Heart disease, unspecified: Secondary | ICD-10-CM | POA: Diagnosis not present

## 2018-02-04 DIAGNOSIS — I428 Other cardiomyopathies: Secondary | ICD-10-CM | POA: Diagnosis present

## 2018-02-04 NOTE — Progress Notes (Signed)
Mrs. Hower presented for an echocardiogram this morning. Her EF changed from 50-55% in 10/2016 to 30-35% today. Her mean aortic valve gradients increased from up to . DOD (Dr. Katrinka Blazing) was notified and said that she is ok to be discharged as she does not have any symptoms.  Dewitt Hoes, RDCS

## 2018-02-11 ENCOUNTER — Telehealth: Payer: Self-pay

## 2018-02-11 DIAGNOSIS — I359 Nonrheumatic aortic valve disorder, unspecified: Secondary | ICD-10-CM

## 2018-02-11 NOTE — Telephone Encounter (Signed)
Call placed to Pt.  Advised of results of ECHO.  Per Pt she no longer has a general cardiologist.  Would like to be referred to someone at [facility].  Will discuss with Dr. Johney Frame and advise Pt.  Pt indicates understanding.

## 2018-02-11 NOTE — Telephone Encounter (Signed)
-----   Message from Hillis Range, MD sent at 02/05/2018 10:15 PM EST ----- Results reviewed.  Boneta Lucks, please inform pt of result.  Her EF has declined.  She also appears to have severe valvular heart disease.  She is followed by Dr Hanley Hays for general cardiology.  I am scheduled to see her after the holidays to discuss lead revision. I will route to Dr Hanley Hays also

## 2018-02-12 NOTE — Telephone Encounter (Signed)
Per Dr. Johney Frame, refer Pt to Dr. Jacques Navy for further valve management.  Will order.

## 2018-02-21 ENCOUNTER — Telehealth: Payer: Self-pay | Admitting: *Deleted

## 2018-02-21 NOTE — Telephone Encounter (Signed)
Referral sent to scheduling and notes on file. °

## 2018-02-28 ENCOUNTER — Telehealth: Payer: Self-pay | Admitting: Internal Medicine

## 2018-02-28 NOTE — Telephone Encounter (Signed)
New message   Pt c/o swelling: STAT is pt has developed SOB within 24 hours  1) How much weight have you gained and in what time span? Up and down. Pt said she was 147 and now 157  2) If swelling, where is the swelling located? Legs and feet  3) Are you currently taking a fluid pill? Yes pt said she thought it was supposed to be changed but wasn't changed.  4) Are you currently SOB? No, not really per pt. Pt said if she walks a long way she does.  5) Do you have a log of your daily weights (if so, list)? no  6) Have you gained 3 pounds in a day or 5 pounds in a week? Pt does not know. She doesn't think so.  7) Have you traveled recently? No

## 2018-02-28 NOTE — Telephone Encounter (Signed)
Returned call to Pt.  Per review of Pt med list, she is taking 3 different ace inhibitors, and 3 different diuretics.    No labs on file.  Call placed to Pt.  Pt is not sure what medications she is taking.  Advised Pt to come to office on Monday 12/2 and bring all her meds. Will review and correct as needed.  Pt may need lab work.

## 2018-03-03 ENCOUNTER — Telehealth: Payer: Self-pay | Admitting: Internal Medicine

## 2018-03-03 NOTE — Telephone Encounter (Signed)
Pt did not show up for visit to review her medications.  Will continue to be available if Pt would like to come in for medication review.

## 2018-03-03 NOTE — Telephone Encounter (Signed)
Pt running late.  Advised to come on.

## 2018-03-03 NOTE — Telephone Encounter (Signed)
Patient called to talk to nurse about her medication. She would like a call back.

## 2018-03-10 ENCOUNTER — Ambulatory Visit (INDEPENDENT_AMBULATORY_CARE_PROVIDER_SITE_OTHER): Payer: Medicaid Other

## 2018-03-10 DIAGNOSIS — I428 Other cardiomyopathies: Secondary | ICD-10-CM

## 2018-03-11 LAB — CUP PACEART REMOTE DEVICE CHECK
Battery Remaining Longevity: 24 mo
Brady Statistic AP VS Percent: 1 %
HIGH POWER IMPEDANCE MEASURED VALUE: 56 Ohm
HighPow Impedance: 56 Ohm
Implantable Lead Implant Date: 20140429
Implantable Lead Implant Date: 20140617
Implantable Lead Location: 753858
Implantable Lead Location: 753860
Implantable Lead Model: 7121
Implantable Pulse Generator Implant Date: 20140429
Lead Channel Impedance Value: 2450 Ohm
Lead Channel Impedance Value: 510 Ohm
Lead Channel Impedance Value: 690 Ohm
Lead Channel Pacing Threshold Amplitude: 1 V
Lead Channel Pacing Threshold Amplitude: 1.5 V
Lead Channel Pacing Threshold Amplitude: 7.25 V
Lead Channel Pacing Threshold Pulse Width: 0.5 ms
Lead Channel Sensing Intrinsic Amplitude: 12 mV
Lead Channel Setting Pacing Amplitude: 0.25 V
Lead Channel Setting Pacing Amplitude: 2.5 V
Lead Channel Setting Pacing Pulse Width: 0.05 ms
Lead Channel Setting Pacing Pulse Width: 0.5 ms
Lead Channel Setting Sensing Sensitivity: 0.5 mV
MDC IDC LEAD IMPLANT DT: 20140429
MDC IDC LEAD LOCATION: 753859
MDC IDC MSMT BATTERY REMAINING PERCENTAGE: 26 %
MDC IDC MSMT BATTERY VOLTAGE: 2.89 V
MDC IDC MSMT LEADCHNL LV PACING THRESHOLD PULSEWIDTH: 0.5 ms
MDC IDC MSMT LEADCHNL RA SENSING INTR AMPL: 4.1 mV
MDC IDC MSMT LEADCHNL RV PACING THRESHOLD PULSEWIDTH: 0.05 ms
MDC IDC SESS DTM: 20191231000716
MDC IDC SET LEADCHNL RA PACING AMPLITUDE: 2 V
MDC IDC STAT BRADY AP VP PERCENT: 1 %
MDC IDC STAT BRADY AS VP PERCENT: 97 %
MDC IDC STAT BRADY AS VS PERCENT: 3 %
MDC IDC STAT BRADY RA PERCENT PACED: 1 %
Pulse Gen Serial Number: 7094184

## 2018-03-11 NOTE — Progress Notes (Signed)
Remote ICD transmission.  ° °

## 2018-03-18 ENCOUNTER — Other Ambulatory Visit: Payer: Self-pay | Admitting: Internal Medicine

## 2018-03-19 ENCOUNTER — Encounter: Payer: Self-pay | Admitting: Internal Medicine

## 2018-03-19 ENCOUNTER — Ambulatory Visit: Payer: Medicaid Other | Admitting: Internal Medicine

## 2018-03-19 ENCOUNTER — Other Ambulatory Visit: Payer: Self-pay

## 2018-03-19 VITALS — BP 92/54 | HR 83 | Ht 70.0 in | Wt 254.0 lb

## 2018-03-19 DIAGNOSIS — Z95810 Presence of automatic (implantable) cardiac defibrillator: Secondary | ICD-10-CM

## 2018-03-19 DIAGNOSIS — Z01812 Encounter for preprocedural laboratory examination: Secondary | ICD-10-CM

## 2018-03-19 DIAGNOSIS — I519 Heart disease, unspecified: Secondary | ICD-10-CM

## 2018-03-19 DIAGNOSIS — I428 Other cardiomyopathies: Secondary | ICD-10-CM | POA: Diagnosis not present

## 2018-03-19 NOTE — H&P (View-Only) (Signed)
PCP: Dennard Schaumann, MD Primary Cardiologist: Dr Hanley Hays Primary EP: Dr Johney Frame  Donna Hopkins is a 63 y.o. female who presents today for routine electrophysiology followup.  Since last being seen in our clinic, the patient reports doing very well.  Today, she denies symptoms of palpitations, chest pain, shortness of breath,  lower extremity edema, dizziness, presyncope, syncope, or ICD shocks.  The patient is otherwise without complaint today.   Past Medical History:  Diagnosis Date   Carotid stenosis, asymptomatic, bilateral    Chronic kidney disease, stage III (moderate) (HCC)    Chronic low back pain    Clinical systolic heart failure, chronic (HCC)    Depression    Diabetes mellitus    Eosinophilic gastritis    GERD (gastroesophageal reflux disease)    Gout, renal disease    H/O mechanical aortic valve replacement    on coumadin   Hx of aortic valve replacement    Hypertension    Hypocalcemia    Hyponatremia    ICD (implantable cardioverter-defibrillator) in place    Medication refill    Mixed hyperlipidemia    Neuropathy    Obesity    OP (osteoporosis)    Peripheral vascular disease, unspecified (HCC)    Persistent insomnia    Personal history of colonic polyps    Pulmonary hypertension (HCC)    Snoring    declines sleep study   Tricuspid regurgitation    Uncontrolled type 2 diabetes mellitus with complication (HCC)    Valgus foot    Vertigo, benign positional    Vitamin D deficiency    Past Surgical History:  Procedure Laterality Date   ABDOMINAL HYSTERECTOMY     BACK SURGERY     BI-VENTRICULAR IMPLANTABLE CARDIOVERTER DEFIBRILLATOR  (CRT-D)  07/08/2012   SJM Quadra Assura BiV ICD implanted by Dr Reed Pandy at [facility]   CARDIAC VALVE REPLACEMENT      ROS- all systems are reviewed and negative except as per HPI above  Current Outpatient Medications  Medication Sig Dispense Refill   acetaminophen (TYLENOL) 500 MG  tablet Take 1,000 mg by mouth every 6 (six) hours as needed. For pain     albuterol (PROVENTIL HFA;VENTOLIN HFA) 108 (90 BASE) MCG/ACT inhaler Inhale 2 puffs into the lungs every 6 (six) hours as needed. For wheezing       allopurinol (ZYLOPRIM) 300 MG tablet Take 300 mg by mouth daily.     ALPRAZolam (XANAX) 0.5 MG tablet Take 0.5 mg by mouth 2 (two) times daily as needed. For sleep or anxiety     amLODipine-benazepril (LOTREL) 10-20 MG per capsule Take 1 capsule by mouth daily.       atorvastatin (LIPITOR) 40 MG tablet Take 40 mg by mouth daily.     carvedilol (COREG) 25 MG tablet Take 25 mg by mouth 2 (two) times daily with a meal.      cetirizine (ZYRTEC) 10 MG tablet Take 10 mg by mouth daily.     cyclobenzaprine (FLEXERIL) 10 MG tablet Take 10 mg by mouth 3 (three) times daily as needed for muscle spasms.     doxepin (SINEQUAN) 50 MG capsule Take 50 mg by mouth at bedtime.     estrogens, conjugated, (PREMARIN) 1.25 MG tablet Take 1.25 mg by mouth daily.       Exenatide ER (BYDUREON) 2 MG SRER Inject into the skin as directed.      ferrous sulfate 325 (65 FE) MG tablet Take 325 mg by mouth daily with breakfast.  fluticasone (FLONASE) 50 MCG/ACT nasal spray Place 1 spray into both nostrils daily.     furosemide (LASIX) 40 MG tablet Take 40 mg by mouth daily.      gabapentin (NEURONTIN) 800 MG tablet Take 800 mg by mouth 3 (three) times daily.       insulin NPH-insulin regular (NOVOLIN 70/30) (70-30) 100 UNIT/ML injection Inject 75 Units into the skin 2 (two) times daily.       lidocaine (XYLOCAINE) 5 % ointment Apply 1 application topically as needed.     LYRICA 100 MG capsule Take 100 mg by mouth 3 (three) times daily.  5   metFORMIN (GLUCOPHAGE) 1000 MG tablet Take 1,000 mg by mouth 2 (two) times daily with a meal.     omeprazole (PRILOSEC) 20 MG capsule Take 20 mg by mouth daily.       oxyCODONE-acetaminophen (PERCOCET) 10-325 MG per tablet Take 1 tablet by mouth  3 (three) times daily.     OZEMPIC, 0.25 OR 0.5 MG/DOSE, 2 MG/1.5ML SOPN Inject 1 Dose into the skin once a week.  3   potassium chloride SA (K-DUR,KLOR-CON) 20 MEQ tablet Take 20 mEq by mouth daily.       PRESCRIPTION MEDICATION Take 1 tablet by mouth daily. Kidney medication to help clear suds from urine     quinapril (ACCUPRIL) 40 MG tablet Take 40 mg by mouth daily.     ramipril (ALTACE) 10 MG capsule Take 10 mg by mouth daily.     sertraline (ZOLOFT) 100 MG tablet Take 100 mg by mouth daily.       spironolactone (ALDACTONE) 25 MG tablet Take 25 mg by mouth daily.     torsemide (DEMADEX) 20 MG tablet Take 20 mg by mouth daily.     triamcinolone cream (KENALOG) 0.1 % Apply 1 application topically 2 (two) times daily.     Vitamin D, Ergocalciferol, (DRISDOL) 50000 units CAPS capsule Take 1 capsule by mouth once a week.     warfarin (COUMADIN) 6 MG tablet Take as directed by the coumadin clinic     No current facility-administered medications for this visit.     Physical Exam: Vitals:   03/19/18 1629  BP: (!) 92/54  Pulse: 83  SpO2: 98%  Weight: 254 lb (115.2 kg)  Height: 5\' 10"  (1.778 m)    GEN- The patient is well appearing, alert and oriented x 3 today.   Head- normocephalic, atraumatic Eyes-  Sclera clear, conjunctiva pink Ears- hearing intact Oropharynx- clear Lungs- Clear to ausculation bilaterally, normal work of breathing Chest- ICD pocket is well healed Heart- Regular rate and rhythm, no murmurs, rubs or gallops, PMI not laterally displaced GI- soft, NT, ND, + BS Extremities- no clubbing, cyanosis, or edema  ICD interrogation- reviewed in detail today,  See PACEART report  ekg tracing ordered today is personally reviewed and shows sinus with LV pacing  Wt Readings from Last 3 Encounters:  03/19/18 254 lb (115.2 kg)  01/17/18 251 lb 6.4 oz (114 kg)  01/01/18 247 lb (112 kg)    Assessment and Plan:  1.  Chronic systolic dysfunction/ ICD lead  failure euvolemic today Stable on an appropriate medical regimen RV lead has failed.  Risks, benefits, and alternatives to ICD system revision with a new RV lead placement were discussed in detail today.  The patient understands that risks include but are not limited to bleeding, infection, pneumothorax, perforation, tamponade, vascular damage, renal failure, MI, stroke, death, inappropriate shocks, damage to his existing  leads, and lead dislodgement and wishes to proceed.  We will therefore schedule the procedure at the next available time.  Hold coumadin 24 hours prior to the procedure.   2. S/p mechanical AVR Followed by Dr Hanley Hays His recent notes are reviewed  Hillis Range MD, [facility] 03/19/2018 4:36 PM

## 2018-03-19 NOTE — Patient Instructions (Addendum)
Medication Instructions:  Your physician recommends that you continue on your current medications as directed. Please refer to the Current Medication list given to you today.  * If you need a refill on your cardiac medications before your next appointment, please call your pharmacy.   Labwork: Your physician recommends that you return for lab work between 1/9 - 2/3 *We will only notify you of abnormal results, otherwise continue current treatment plan.  Testing/Procedures: Your physician has recommended that you have a new lead placed for your defibrillator.   Follow-Up: Your physician recommends that you schedule a follow-up appointment in: 10-14 days, after your procedure on 04/16/18, with device clinic for a wound check.  Your physician recommends that you schedule a follow-up appointment in: 91 days, after your procedure on 04/16/18, with Dr. Johney Frame.  Thank you for choosing [facility]!!    Any Other Special Instructions Will Be Listed Below (If Applicable).   Device Instructions  You are scheduled for:                  _____ RV lead for your Defibrillator  on  04/16/2018  with Dr. Johney Frame.  1.   Please arrive at the [facility], Entrance "A"  at [facility] at  8:30 a.m. on the day of your procedure. (The address is [address]  2. Do not eat or drink after midnight the night before your procedure.  3.   Complete pre procedure  lab work on ________.  The lab at [facility] is open from 8:00 AM to 4:30 PM.  You do not have to be fasting.  4.   A) - Take 1/2 your usually dose of bedtime insulin (Levemir) the night before   your procedure.  - Hold the following medications the morning of your procedure:    1.  Lasix   2. Insulin   3. Metformin           - All of your remaining medications may be taken with a small amount of   water the morning of your procedure.  5.  Plan for an overnight stay.  Bring your insurance cards and a list of you  medications.  6.  Wash your chest and neck with surgical scrub the evening before and the morning of  your procedure.  Rinse well. Please review the surgical scrub instruction sheet given to you.                                                                                                              * If you have ANY questions after you get home, please call Dory Horn, RN @ [phone].  * Every attempt is made to prevent procedures from being rescheduled.  Due to the nature of  Electrophysiology, rescheduling can happen.  The physician is always aware and directs the staff when this occurs.

## 2018-03-19 NOTE — Progress Notes (Signed)
PCP: Dennard Schaumann, MD Primary Cardiologist: Dr Hanley Hays Primary EP: Dr Johney Frame  Donna Hopkins is a 63 y.o. female who presents today for routine electrophysiology followup.  Since last being seen in our clinic, the patient reports doing very well.  Today, she denies symptoms of palpitations, chest pain, shortness of breath,  lower extremity edema, dizziness, presyncope, syncope, or ICD shocks.  The patient is otherwise without complaint today.   Past Medical History:  Diagnosis Date   Carotid stenosis, asymptomatic, bilateral    Chronic kidney disease, stage III (moderate) (HCC)    Chronic low back pain    Clinical systolic heart failure, chronic (HCC)    Depression    Diabetes mellitus    Eosinophilic gastritis    GERD (gastroesophageal reflux disease)    Gout, renal disease    H/O mechanical aortic valve replacement    on coumadin   Hx of aortic valve replacement    Hypertension    Hypocalcemia    Hyponatremia    ICD (implantable cardioverter-defibrillator) in place    Medication refill    Mixed hyperlipidemia    Neuropathy    Obesity    OP (osteoporosis)    Peripheral vascular disease, unspecified (HCC)    Persistent insomnia    Personal history of colonic polyps    Pulmonary hypertension (HCC)    Snoring    declines sleep study   Tricuspid regurgitation    Uncontrolled type 2 diabetes mellitus with complication (HCC)    Valgus foot    Vertigo, benign positional    Vitamin D deficiency    Past Surgical History:  Procedure Laterality Date   ABDOMINAL HYSTERECTOMY     BACK SURGERY     BI-VENTRICULAR IMPLANTABLE CARDIOVERTER DEFIBRILLATOR  (CRT-D)  07/08/2012   SJM Quadra Assura BiV ICD implanted by Dr Reed Pandy at [facility]   CARDIAC VALVE REPLACEMENT      ROS- all systems are reviewed and negative except as per HPI above  Current Outpatient Medications  Medication Sig Dispense Refill   acetaminophen (TYLENOL) 500 MG  tablet Take 1,000 mg by mouth every 6 (six) hours as needed. For pain     albuterol (PROVENTIL HFA;VENTOLIN HFA) 108 (90 BASE) MCG/ACT inhaler Inhale 2 puffs into the lungs every 6 (six) hours as needed. For wheezing       allopurinol (ZYLOPRIM) 300 MG tablet Take 300 mg by mouth daily.     ALPRAZolam (XANAX) 0.5 MG tablet Take 0.5 mg by mouth 2 (two) times daily as needed. For sleep or anxiety     amLODipine-benazepril (LOTREL) 10-20 MG per capsule Take 1 capsule by mouth daily.       atorvastatin (LIPITOR) 40 MG tablet Take 40 mg by mouth daily.     carvedilol (COREG) 25 MG tablet Take 25 mg by mouth 2 (two) times daily with a meal.      cetirizine (ZYRTEC) 10 MG tablet Take 10 mg by mouth daily.     cyclobenzaprine (FLEXERIL) 10 MG tablet Take 10 mg by mouth 3 (three) times daily as needed for muscle spasms.     doxepin (SINEQUAN) 50 MG capsule Take 50 mg by mouth at bedtime.     estrogens, conjugated, (PREMARIN) 1.25 MG tablet Take 1.25 mg by mouth daily.       Exenatide ER (BYDUREON) 2 MG SRER Inject into the skin as directed.      ferrous sulfate 325 (65 FE) MG tablet Take 325 mg by mouth daily with breakfast.  fluticasone (FLONASE) 50 MCG/ACT nasal spray Place 1 spray into both nostrils daily.    °• furosemide (LASIX) 40 MG tablet Take 40 mg by mouth daily.     °• gabapentin (NEURONTIN) 800 MG tablet Take 800 mg by mouth 3 (three) times daily.      °• insulin NPH-insulin regular (NOVOLIN 70/30) (70-30) 100 UNIT/ML injection Inject 75 Units into the skin 2 (two) times daily.      °• lidocaine (XYLOCAINE) 5 % ointment Apply 1 application topically as needed.    °• LYRICA 100 MG capsule Take 100 mg by mouth 3 (three) times daily.  5  °• metFORMIN (GLUCOPHAGE) 1000 MG tablet Take 1,000 mg by mouth 2 (two) times daily with a meal.    °• omeprazole (PRILOSEC) 20 MG capsule Take 20 mg by mouth daily.      °• oxyCODONE-acetaminophen (PERCOCET) 10-325 MG per tablet Take 1 tablet by mouth  3 (three) times daily.    °• OZEMPIC, 0.25 OR 0.5 MG/DOSE, 2 MG/1.5ML SOPN Inject 1 Dose into the skin once a week.  3  °• potassium chloride SA (K-DUR,KLOR-CON) 20 MEQ tablet Take 20 mEq by mouth daily.      °• PRESCRIPTION MEDICATION Take 1 tablet by mouth daily. Kidney medication to help clear suds from urine    °• quinapril (ACCUPRIL) 40 MG tablet Take 40 mg by mouth daily.    °• ramipril (ALTACE) 10 MG capsule Take 10 mg by mouth daily.    °• sertraline (ZOLOFT) 100 MG tablet Take 100 mg by mouth daily.      °• spironolactone (ALDACTONE) 25 MG tablet Take 25 mg by mouth daily.    °• torsemide (DEMADEX) 20 MG tablet Take 20 mg by mouth daily.    °• triamcinolone cream (KENALOG) 0.1 % Apply 1 application topically 2 (two) times daily.    °• Vitamin D, Ergocalciferol, (DRISDOL) 50000 units CAPS capsule Take 1 capsule by mouth once a week.    °• warfarin (COUMADIN) 6 MG tablet Take as directed by the coumadin clinic    ° °No current facility-administered medications for this visit.   ° ° °Physical Exam: °Vitals:  ° 03/19/18 1629  °BP: (!) 92/54  °Pulse: 83  °SpO2: 98%  °Weight: 254 lb (115.2 kg)  °Height: 5' 10" (1.778 m)  ° ° °GEN- The patient is well appearing, alert and oriented x 3 today.   °Head- normocephalic, atraumatic °Eyes-  Sclera clear, conjunctiva pink °Ears- hearing intact °Oropharynx- clear °Lungs- Clear to ausculation bilaterally, normal work of breathing °Chest- ICD pocket is well healed °Heart- Regular rate and rhythm, no murmurs, rubs or gallops, PMI not laterally displaced °GI- soft, NT, ND, + BS °Extremities- no clubbing, cyanosis, or edema ° °ICD interrogation- reviewed in detail today,  See PACEART report ° °ekg tracing ordered today is personally reviewed and shows sinus with LV pacing ° °Wt Readings from Last 3 Encounters:  °03/19/18 254 lb (115.2 kg)  °01/17/18 251 lb 6.4 oz (114 kg)  °01/01/18 247 lb (112 kg)  ° ° °Assessment and Plan: ° °1.  Chronic systolic dysfunction/ ICD lead  failure °euvolemic today °Stable on an appropriate medical regimen °RV lead has failed.  Risks, benefits, and alternatives to ICD system revision with a new RV lead placement were discussed in detail today.  The patient understands that risks include but are not limited to bleeding, infection, pneumothorax, perforation, tamponade, vascular damage, renal failure, MI, stroke, death, inappropriate shocks, damage to his existing   leads, and lead dislodgement and wishes to proceed.  We will therefore schedule the procedure at the next available time.  Hold coumadin 24 hours prior to the procedure.   2. S/p mechanical AVR Followed by Dr Hanley Hays His recent notes are reviewed  Hillis Range MD, [facility] 03/19/2018 4:36 PM

## 2018-03-20 ENCOUNTER — Telehealth: Payer: Self-pay

## 2018-03-20 DIAGNOSIS — I359 Nonrheumatic aortic valve disorder, unspecified: Secondary | ICD-10-CM

## 2018-03-20 NOTE — Addendum Note (Signed)
Addended by: Roney Mans A on: 03/20/2018 02:05 PM   Modules accepted: Orders

## 2018-03-20 NOTE — Telephone Encounter (Signed)
Call placed to Pt.  Went over instructions for upcoming procedure.  Advised Pt to come for labs on 04/14/2018.  Advised Pt to hold warfarin on 04/15/2018.  Advised Pt to arrive at [facility] at 9:30 am on April 16, 2018. Advised Pt to HOLD ALL medications on morning of her procedure.  Reiterated instruction three times.  Pt indicates understanding.

## 2018-03-20 NOTE — Telephone Encounter (Signed)
-----   Message from Hillis Range, MD sent at 03/19/2018 11:48 PM EST ----- Not sure if we told her to hold coumadin prior to ablation. She should hold 24 hours prior to the procedure.

## 2018-03-21 NOTE — Addendum Note (Signed)
Addended by: Solon Augusta on: 03/21/2018 11:23 AM   Modules accepted: Orders

## 2018-04-10 ENCOUNTER — Ambulatory Visit: Payer: Medicaid Other | Admitting: Internal Medicine

## 2018-04-10 ENCOUNTER — Telehealth: Payer: Self-pay

## 2018-04-10 NOTE — Telephone Encounter (Signed)
Patient was scheduled to see Dr.Acharya today at 2pm at the [facility] office to establish care for general cardiology. Call and spoke with the patient. She sts that she was not aware of the appt. She is scheduled for ICD lead revision and f/u with Dr.Allred in February. Adv pt that Dr.Acharya feels that we can push her appointment out 2-3 months. Asked if she had a preference in office location [facility] or [facility]. She would prefer the [facility] location. April 2020 [facility] provider schedule is not available, recall placed for 3 months.

## 2018-04-14 ENCOUNTER — Other Ambulatory Visit: Payer: Medicaid Other | Admitting: *Deleted

## 2018-04-14 DIAGNOSIS — I359 Nonrheumatic aortic valve disorder, unspecified: Secondary | ICD-10-CM

## 2018-04-14 DIAGNOSIS — I428 Other cardiomyopathies: Secondary | ICD-10-CM

## 2018-04-14 DIAGNOSIS — Z01812 Encounter for preprocedural laboratory examination: Secondary | ICD-10-CM

## 2018-04-14 DIAGNOSIS — I519 Heart disease, unspecified: Secondary | ICD-10-CM

## 2018-04-15 LAB — CBC
Hematocrit: 32.8 % — ABNORMAL LOW (ref 34.0–46.6)
Hemoglobin: 10.8 g/dL — ABNORMAL LOW (ref 11.1–15.9)
MCH: 31.9 pg (ref 26.6–33.0)
MCHC: 32.9 g/dL (ref 31.5–35.7)
MCV: 97 fL (ref 79–97)
Platelets: 260 10*3/uL (ref 150–450)
RBC: 3.39 x10E6/uL — ABNORMAL LOW (ref 3.77–5.28)
RDW: 12.1 % (ref 11.7–15.4)
WBC: 7.3 10*3/uL (ref 3.4–10.8)

## 2018-04-15 LAB — BASIC METABOLIC PANEL
BUN/Creatinine Ratio: 12 (ref 12–28)
BUN: 9 mg/dL (ref 8–27)
CO2: 21 mmol/L (ref 20–29)
Calcium: 8.5 mg/dL — ABNORMAL LOW (ref 8.7–10.3)
Chloride: 95 mmol/L — ABNORMAL LOW (ref 96–106)
Creatinine, Ser: 0.76 mg/dL (ref 0.57–1.00)
GFR calc Af Amer: 96 mL/min/{1.73_m2} (ref >59)
GFR, EST NON AFRICAN AMERICAN: 83 mL/min/{1.73_m2} (ref >59)
Glucose: 412 mg/dL — ABNORMAL HIGH (ref 65–99)
Potassium: 4 mmol/L (ref 3.5–5.2)
Sodium: 135 mmol/L (ref 134–144)

## 2018-04-15 LAB — PROTIME-INR
INR: 4 — ABNORMAL HIGH (ref 0.8–1.2)
Prothrombin Time: 36.5 s — ABNORMAL HIGH (ref 9.1–12.0)

## 2018-04-16 ENCOUNTER — Ambulatory Visit (HOSPITAL_COMMUNITY)
Admission: RE | Disposition: A | Discharge status: Home / Self Care | Payer: Self-pay | Source: Home / Self Care | Attending: Internal Medicine

## 2018-04-16 ENCOUNTER — Ambulatory Visit (HOSPITAL_COMMUNITY)
Admission: RE | Admit: 2018-04-16 | Discharge: 2018-04-17 | Disposition: A | Discharge status: Home / Self Care | Payer: Medicaid Other | Attending: Internal Medicine | Admitting: Internal Medicine

## 2018-04-16 ENCOUNTER — Encounter (HOSPITAL_COMMUNITY): Payer: Self-pay | Admitting: Internal Medicine

## 2018-04-16 DIAGNOSIS — E1151 Type 2 diabetes mellitus with diabetic peripheral angiopathy without gangrene: Secondary | ICD-10-CM | POA: Insufficient documentation

## 2018-04-16 DIAGNOSIS — E559 Vitamin D deficiency, unspecified: Secondary | ICD-10-CM | POA: Insufficient documentation

## 2018-04-16 DIAGNOSIS — I272 Pulmonary hypertension, unspecified: Secondary | ICD-10-CM | POA: Insufficient documentation

## 2018-04-16 DIAGNOSIS — I447 Left bundle-branch block, unspecified: Secondary | ICD-10-CM | POA: Insufficient documentation

## 2018-04-16 DIAGNOSIS — I6523 Occlusion and stenosis of bilateral carotid arteries: Secondary | ICD-10-CM | POA: Insufficient documentation

## 2018-04-16 DIAGNOSIS — I519 Heart disease, unspecified: Secondary | ICD-10-CM | POA: Diagnosis present

## 2018-04-16 DIAGNOSIS — Z006 Encounter for examination for normal comparison and control in clinical research program: Secondary | ICD-10-CM | POA: Insufficient documentation

## 2018-04-16 DIAGNOSIS — T82118A Breakdown (mechanical) of other cardiac electronic device, initial encounter: Secondary | ICD-10-CM | POA: Diagnosis present

## 2018-04-16 DIAGNOSIS — E782 Mixed hyperlipidemia: Secondary | ICD-10-CM | POA: Diagnosis not present

## 2018-04-16 DIAGNOSIS — Z794 Long term (current) use of insulin: Secondary | ICD-10-CM | POA: Diagnosis not present

## 2018-04-16 DIAGNOSIS — Z952 Presence of prosthetic heart valve: Secondary | ICD-10-CM | POA: Diagnosis not present

## 2018-04-16 DIAGNOSIS — Z79899 Other long term (current) drug therapy: Secondary | ICD-10-CM | POA: Diagnosis not present

## 2018-04-16 DIAGNOSIS — Z959 Presence of cardiac and vascular implant and graft, unspecified: Secondary | ICD-10-CM

## 2018-04-16 DIAGNOSIS — M109 Gout, unspecified: Secondary | ICD-10-CM | POA: Insufficient documentation

## 2018-04-16 DIAGNOSIS — Y839 Surgical procedure, unspecified as the cause of abnormal reaction of the patient, or of later complication, without mention of misadventure at the time of the procedure: Secondary | ICD-10-CM | POA: Insufficient documentation

## 2018-04-16 DIAGNOSIS — I428 Other cardiomyopathies: Secondary | ICD-10-CM | POA: Insufficient documentation

## 2018-04-16 DIAGNOSIS — G8929 Other chronic pain: Secondary | ICD-10-CM | POA: Insufficient documentation

## 2018-04-16 DIAGNOSIS — I5022 Chronic systolic (congestive) heart failure: Secondary | ICD-10-CM | POA: Diagnosis not present

## 2018-04-16 DIAGNOSIS — E1140 Type 2 diabetes mellitus with diabetic neuropathy, unspecified: Secondary | ICD-10-CM | POA: Diagnosis not present

## 2018-04-16 DIAGNOSIS — I130 Hypertensive heart and chronic kidney disease with heart failure and stage 1 through stage 4 chronic kidney disease, or unspecified chronic kidney disease: Secondary | ICD-10-CM | POA: Insufficient documentation

## 2018-04-16 DIAGNOSIS — N183 Chronic kidney disease, stage 3 (moderate): Secondary | ICD-10-CM | POA: Insufficient documentation

## 2018-04-16 DIAGNOSIS — T82110A Breakdown (mechanical) of cardiac electrode, initial encounter: Secondary | ICD-10-CM | POA: Diagnosis not present

## 2018-04-16 DIAGNOSIS — F329 Major depressive disorder, single episode, unspecified: Secondary | ICD-10-CM | POA: Diagnosis not present

## 2018-04-16 DIAGNOSIS — E669 Obesity, unspecified: Secondary | ICD-10-CM | POA: Insufficient documentation

## 2018-04-16 DIAGNOSIS — Z95810 Presence of automatic (implantable) cardiac defibrillator: Secondary | ICD-10-CM | POA: Insufficient documentation

## 2018-04-16 DIAGNOSIS — Z7901 Long term (current) use of anticoagulants: Secondary | ICD-10-CM | POA: Insufficient documentation

## 2018-04-16 DIAGNOSIS — K219 Gastro-esophageal reflux disease without esophagitis: Secondary | ICD-10-CM | POA: Diagnosis not present

## 2018-04-16 DIAGNOSIS — E1122 Type 2 diabetes mellitus with diabetic chronic kidney disease: Secondary | ICD-10-CM | POA: Diagnosis not present

## 2018-04-16 HISTORY — PX: LEAD INSERTION: EP1212

## 2018-04-16 LAB — GLUCOSE, CAPILLARY
Glucose-Capillary: 218 mg/dL — ABNORMAL HIGH (ref 70–99)
Glucose-Capillary: 379 mg/dL — ABNORMAL HIGH (ref 70–99)
Glucose-Capillary: 353 mg/dL — ABNORMAL HIGH (ref 70–99)

## 2018-04-16 LAB — PROTIME-INR
INR: 2.8
Prothrombin Time: 29.1 seconds — ABNORMAL HIGH (ref 11.4–15.2)

## 2018-04-16 LAB — SURGICAL PCR SCREEN
MRSA, PCR: NEGATIVE
STAPHYLOCOCCUS AUREUS: NEGATIVE

## 2018-04-16 SURGERY — LEAD INSERTION

## 2018-04-16 MED ORDER — SERTRALINE HCL 100 MG PO TABS
100.0000 mg | ORAL_TABLET | Freq: Every day | ORAL | Status: DC
  Administered 2018-04-16: 100 mg via ORAL
  Filled 2018-04-16: qty 1

## 2018-04-16 MED ORDER — WARFARIN - PHYSICIAN DOSING INPATIENT: Freq: Every day | Status: DC

## 2018-04-16 MED ORDER — ACETAMINOPHEN 325 MG PO TABS: 325.0000 mg | ORAL_TABLET | Freq: Four times a day (QID) | ORAL | Status: DC | PRN

## 2018-04-16 MED ORDER — GABAPENTIN 300 MG PO CAPS
300.0000 mg | ORAL_CAPSULE | Freq: Three times a day (TID) | ORAL | Status: DC
  Administered 2018-04-16 – 2018-04-17 (×3): 300 mg via ORAL
  Filled 2018-04-16 (×3): qty 1

## 2018-04-16 MED ORDER — CEFAZOLIN SODIUM-DEXTROSE 2-4 GM/100ML-% IV SOLN
2.0000 g | INTRAVENOUS | Status: AC
  Administered 2018-04-16: 2 g via INTRAVENOUS
  Filled 2018-04-16: qty 100

## 2018-04-16 MED ORDER — LIDOCAINE HCL (PF) 1 % IJ SOLN
INTRAMUSCULAR | Status: AC
  Filled 2018-04-16: qty 60

## 2018-04-16 MED ORDER — IOPAMIDOL (ISOVUE-370) INJECTION 76%
INTRAVENOUS | Status: AC
  Filled 2018-04-16: qty 50

## 2018-04-16 MED ORDER — SODIUM CHLORIDE 0.9% FLUSH: 3.0000 mL | INTRAVENOUS | Status: DC | PRN

## 2018-04-16 MED ORDER — FENTANYL CITRATE (PF) 100 MCG/2ML IJ SOLN
INTRAMUSCULAR | Status: DC | PRN
  Administered 2018-04-16: 25 ug via INTRAVENOUS
  Administered 2018-04-16: 12.5 ug via INTRAVENOUS

## 2018-04-16 MED ORDER — CEFAZOLIN SODIUM-DEXTROSE 1-4 GM/50ML-% IV SOLN
1.0000 g | Freq: Four times a day (QID) | INTRAVENOUS | Status: AC
  Administered 2018-04-16 – 2018-04-17 (×3): 1 g via INTRAVENOUS
  Filled 2018-04-16 (×3): qty 50

## 2018-04-16 MED ORDER — CYCLOBENZAPRINE HCL 10 MG PO TABS
10.0000 mg | ORAL_TABLET | Freq: Three times a day (TID) | ORAL | Status: DC
  Administered 2018-04-16 – 2018-04-17 (×3): 10 mg via ORAL
  Filled 2018-04-16 (×3): qty 1

## 2018-04-16 MED ORDER — SODIUM CHLORIDE 0.9 % IV SOLN
INTRAVENOUS | Status: DC
  Administered 2018-04-16: 09:00:00 via INTRAVENOUS

## 2018-04-16 MED ORDER — ALPRAZOLAM 0.5 MG PO TABS: 0.5000 mg | ORAL_TABLET | Freq: Every evening | ORAL | Status: DC | PRN

## 2018-04-16 MED ORDER — ACETAMINOPHEN 325 MG PO TABS: 325.0000 mg | ORAL_TABLET | ORAL | Status: DC | PRN

## 2018-04-16 MED ORDER — MIDAZOLAM HCL 5 MG/5ML IJ SOLN
INTRAMUSCULAR | Status: DC | PRN
  Administered 2018-04-16 (×3): 1 mg via INTRAVENOUS

## 2018-04-16 MED ORDER — SODIUM CHLORIDE 0.9 % IV SOLN
250.0000 mL | INTRAVENOUS | Status: DC | PRN
  Administered 2018-04-16 – 2018-04-17 (×2): 250 mL via INTRAVENOUS

## 2018-04-16 MED ORDER — OXYCODONE-ACETAMINOPHEN 10-325 MG PO TABS: 1.0000 | ORAL_TABLET | Freq: Four times a day (QID) | ORAL | Status: DC | PRN

## 2018-04-16 MED ORDER — CARVEDILOL 25 MG PO TABS
25.0000 mg | ORAL_TABLET | Freq: Two times a day (BID) | ORAL | Status: DC
  Administered 2018-04-17: 25 mg via ORAL
  Filled 2018-04-16: qty 1

## 2018-04-16 MED ORDER — FENTANYL CITRATE (PF) 100 MCG/2ML IJ SOLN
INTRAMUSCULAR | Status: AC
  Filled 2018-04-16: qty 2

## 2018-04-16 MED ORDER — MUPIROCIN 2 % EX OINT
TOPICAL_OINTMENT | Freq: Once | CUTANEOUS | Status: AC
  Administered 2018-04-16: 1 via NASAL
  Filled 2018-04-16: qty 22

## 2018-04-16 MED ORDER — OXYCODONE HCL 5 MG PO TABS
10.0000 mg | ORAL_TABLET | Freq: Four times a day (QID) | ORAL | Status: DC | PRN
  Administered 2018-04-16 – 2018-04-17 (×3): 10 mg via ORAL
  Filled 2018-04-16 (×3): qty 2

## 2018-04-16 MED ORDER — LIDOCAINE HCL (PF) 1 % IJ SOLN
INTRAMUSCULAR | Status: DC | PRN
  Administered 2018-04-16: 45 mL

## 2018-04-16 MED ORDER — SODIUM CHLORIDE 0.9 % IV SOLN
80.0000 mg | INTRAVENOUS | Status: DC
  Filled 2018-04-16: qty 2

## 2018-04-16 MED ORDER — SODIUM CHLORIDE 0.9% FLUSH
3.0000 mL | Freq: Two times a day (BID) | INTRAVENOUS | Status: DC
  Administered 2018-04-16 – 2018-04-17 (×2): 3 mL via INTRAVENOUS

## 2018-04-16 MED ORDER — IOPAMIDOL (ISOVUE-370) INJECTION 76%
INTRAVENOUS | Status: DC | PRN
  Administered 2018-04-16: 15 mL via INTRAVENOUS

## 2018-04-16 MED ORDER — FUROSEMIDE 40 MG PO TABS
40.0000 mg | ORAL_TABLET | Freq: Every day | ORAL | Status: DC
  Administered 2018-04-17: 40 mg via ORAL
  Filled 2018-04-16: qty 1

## 2018-04-16 MED ORDER — INSULIN ASPART 100 UNIT/ML ~~LOC~~ SOLN
25.0000 [IU] | Freq: Three times a day (TID) | SUBCUTANEOUS | Status: DC
  Administered 2018-04-16 – 2018-04-17 (×3): 25 [IU] via SUBCUTANEOUS

## 2018-04-16 MED ORDER — HEPARIN (PORCINE) IN NACL 1000-0.9 UT/500ML-% IV SOLN
INTRAVENOUS | Status: DC | PRN
  Administered 2018-04-16: 500 mL

## 2018-04-16 MED ORDER — WARFARIN SODIUM 5 MG PO TABS
5.0000 mg | ORAL_TABLET | Freq: Once | ORAL | Status: AC
  Administered 2018-04-16: 5 mg via ORAL
  Filled 2018-04-16 (×2): qty 1

## 2018-04-16 MED ORDER — MIDAZOLAM HCL 5 MG/5ML IJ SOLN
INTRAMUSCULAR | Status: AC
  Filled 2018-04-16: qty 5

## 2018-04-16 MED ORDER — INSULIN DETEMIR 100 UNIT/ML ~~LOC~~ SOLN
40.0000 [IU] | Freq: Two times a day (BID) | SUBCUTANEOUS | Status: DC
  Administered 2018-04-16 – 2018-04-17 (×2): 40 [IU] via SUBCUTANEOUS
  Filled 2018-04-16 (×2): qty 0.4

## 2018-04-16 MED ORDER — DOXEPIN HCL 50 MG PO CAPS
50.0000 mg | ORAL_CAPSULE | Freq: Every evening | ORAL | Status: DC | PRN
  Filled 2018-04-16: qty 1

## 2018-04-16 MED ORDER — RAMIPRIL 2.5 MG PO CAPS
10.0000 mg | ORAL_CAPSULE | Freq: Every day | ORAL | Status: DC
  Administered 2018-04-17: 10 mg via ORAL
  Filled 2018-04-16: qty 4

## 2018-04-16 MED ORDER — CEFAZOLIN SODIUM-DEXTROSE 2-4 GM/100ML-% IV SOLN
INTRAVENOUS | Status: AC
  Filled 2018-04-16: qty 100

## 2018-04-16 MED ORDER — POTASSIUM CHLORIDE CRYS ER 20 MEQ PO TBCR
20.0000 meq | EXTENDED_RELEASE_TABLET | Freq: Every day | ORAL | Status: DC
  Administered 2018-04-17: 20 meq via ORAL
  Filled 2018-04-16: qty 1

## 2018-04-16 MED ORDER — SODIUM CHLORIDE 0.9 % IV SOLN
INTRAVENOUS | Status: AC
  Filled 2018-04-16: qty 2

## 2018-04-16 MED ORDER — ONDANSETRON HCL 4 MG/2ML IJ SOLN: 4.0000 mg | Freq: Four times a day (QID) | INTRAMUSCULAR | Status: DC | PRN

## 2018-04-16 MED ORDER — HEPARIN (PORCINE) IN NACL 1000-0.9 UT/500ML-% IV SOLN
INTRAVENOUS | Status: AC
  Filled 2018-04-16: qty 500

## 2018-04-16 MED ORDER — ALBUTEROL SULFATE (2.5 MG/3ML) 0.083% IN NEBU: 3.0000 mL | INHALATION_SOLUTION | Freq: Four times a day (QID) | RESPIRATORY_TRACT | Status: DC | PRN

## 2018-04-16 SURGICAL SUPPLY — 7 items
ASSURA CRTD CD3369-40Q (ICD Generator) ×2 IMPLANT
CABLE SURGICAL S-101-97-12 (CABLE) ×2 IMPLANT
DEFIB ASSURA MULTI-CHMBR CRT-D (ICD Generator) ×1 IMPLANT
LEAD DURATA 7122Q-65CM (Lead) ×2 IMPLANT
PAD PRO RADIOLUCENT 2001M-C (PAD) ×2 IMPLANT
SHEATH CLASSIC 7F (SHEATH) ×2 IMPLANT
TRAY PACEMAKER INSERTION (PACKS) ×2 IMPLANT

## 2018-04-16 NOTE — Progress Notes (Signed)
Orthopedic Tech Progress Note Patient Details:  Donna Hopkins 09-13-1955 [id number] Patient has on sling  Patient ID: Donna Hopkins, female   DOB: Jul 30, 1955, 63 y.o.   MRN: [id number]   Donna Hopkins 04/16/2018, 4:00 PM

## 2018-04-16 NOTE — Progress Notes (Signed)
Pt refusing bed alarm. Pt educated on why bed alarm is important to have on.

## 2018-04-16 NOTE — Progress Notes (Signed)
Alert and oriented female admitted from cath lab for placement of ICD/repairof wire. Bulky dsg to left shoulder in place clean and dry. Sling in use for left arm to prevent movement. Pt is bedrest with bathroom privilege until AM. Family with patient.  Call bell in place .

## 2018-04-16 NOTE — Interval H&P Note (Signed)
History and Physical Interval Note:  04/16/2018 11:09 AM  Donna Hopkins  has presented today for surgery, with the diagnosis of cm  The various methods of treatment have been discussed with the patient and family. After consideration of risks, benefits and other options for treatment, the patient has consented to  Procedure(s): ICD REVISION LEAD INSERTION (N/A) as a surgical intervention .  The patient's history has been reviewed, patient examined, no change in status, stable for surgery.  I have reviewed the patient's chart and labs.  Questions were answered to the patient's satisfaction.    ICD Criteria  Current LVEF:30%. Within 12 months prior to implant: Yes   Heart failure history: Yes, Class III  Cardiomyopathy history: Yes, Non-Ischemic Cardiomyopathy.  Atrial Fibrillation/Atrial Flutter: No.  Ventricular tachycardia history: No.  Cardiac arrest history: No.  History of syndromes with risk of sudden death: No.  Previous ICD: Yes, Reason for ICD:  Primary prevention.  Current ICD indication: Primary  PPM indication:  BiV ICD  Beta Blocker therapy for 3 or more months: Yes, prescribed.   Ace Inhibitor/ARB therapy for 3 or more months: Yes, prescribed.    I have seen Donna Hopkins is a 63 y.o. female who presents today for ICD system revision due to RV lead fracture.  New RV lead placement is planned.   I have had a thorough discussion with the patient and daughter reviewing options.  The patient and their daughter have had opportunities to ask questions and have them answered. The patient and I have decided together through the [facility] Heart Care Share Decision Support Tool to proceed with ICD system revision with new RV lead placement at this time.  Risks, benefits, alternatives to the procedure were discussed in detail with the patient today. The patient  understands that the risks include but are not limited to bleeding, infection, pneumothorax, perforation, tamponade, vascular  damage, renal failure, MI, stroke, death, inappropriate shocks, and lead dislodgement and wishes to proceed.    Donna Range MD, [facility] 04/16/2018 11:11 AM

## 2018-04-16 NOTE — Plan of Care (Signed)
Problem: Education: Goal: Ability to demonstrate management of disease process will improve Outcome: Not Progressing   Problem: Activity: Goal: Capacity to carry out activities will improve Outcome: Not Met (add Reason)   Problem: Cardiac: Goal: Ability to achieve and maintain adequate cardiopulmonary perfusion will improve Outcome: Not Met (add Reason)   Problem: Education: Goal: Knowledge of General Education information will improve Description Including pain rating scale, medication(s)/side effects and non-pharmacologic comfort measures Outcome: Not Met (add Reason)

## 2018-04-16 NOTE — Discharge Summary (Addendum)
ELECTROPHYSIOLOGY PROCEDURE DISCHARGE SUMMARY    Patient ID: Donna Hopkins,  MRN: [id number], DOB/AGE: Aug 17, 1955 63 y.o.  Admit date: 04/16/2018 Discharge date: 04/17/2018  Primary Care Physician: Dennard Schaumann, MD (Inactive)  Primary Cardiologist: Dr. Hanley Hays Electrophysiologist: Dr. Johney Frame  Primary Discharge Diagnosis:  1. ICD RV lead failure  Secondary Discharge Diagnosis:  1. CKD (III) 2. CM     Chronic CHF (systolic) 3. DM 4. HTN 5. VHD     H/o Mechanical AVR 6. LBBB   Allergies  Allergen Reactions  . Canagliflozin Shortness Of Breath  . Tetanus Toxoids     Infection at injection site     Procedures This Admission:  1.  04/16/2018: Dr. Johney Frame      BiV ICD system revision with a new SJM RV lead placed and generator change      [facility] model [phone] [facility]) [facility] lead       [facility] [place] model [id number] ICD ([place] [id number] ).      CXR on 04/17/2018 noted no pneumothorax  Brief HPI: Donna Hopkins is a 63 y.o. female is followed out patient by EP, Dr. Johney Frame note with RV lead failure, planned for lead revision, new lead implant, and generator change   Hospital Course:  The patient was admitted and underwent planned procedure with details as outlined above. She was monitored on telemetry overnight which demonstrated SR/V paced.  Left chest was without hematoma or ecchymosis.  The device was interrogated and found to be functioning normally.  CXR was obtained and demonstrated no pneumothorax status post device implantation.  Wound care, arm mobility, and restrictions were reviewed with the patient.  The patient feels well this morning, no CP or SOB, she was examined by Dr. Johney Frame and considered stable for discharge to home.   INR 2/3 was 4, warfarin held prior to procedure, down to 2.8, and after 5mg  dose last evening She is instructed to take 2.5mg  today and tomorrow, resume her usual dose of 6mg  daily after that.  I have made  an appointment for the patient with her PMD to review her home medicine list, clarify her doses/frequency, her DM management, as well as get an INR check.  The patient's BS have been high, she was counseled on diet/eating habits as well.  BP check with me in the room prior to discharge is 99/64  Physical Exam: Vitals:   04/16/18 2034 04/17/18 0121 04/17/18 0426 04/17/18 0805  BP: 114/64 106/82 132/71 (!) 125/110  Pulse: 89 82 83 77  Resp: 20 20    Temp: (!) 97.5 F (36.4 C) (!) 97.5 F (36.4 C) 98.4 F (36.9 C)   TempSrc: Oral Oral Oral   SpO2: 96% 98% 97% 95%  Weight:   115.1 kg   Height:        GEN- The patient is well appearing, alert and oriented x 3 today.   HEENT: normocephalic, atraumatic; sclera clear, conjunctiva pink; hearing intact; oropharynx clear Lungs- CTA b/l, normal work of breathing.  No wheezes, rales, rhonchi Heart-  RRR, no murmurs, rubs or gallops, PMI not laterally displaced GI- soft, non-tender, non-distended Extremities- no clubbing, cyanosis, or edema MS- no significant deformity or atrophy Skin- warm and dry, no rash or lesion, left chest without hematoma/ecchymosis Psych- euthymic mood, full affect Neuro- no gross defecits  Labs:   Lab Results  Component Value Date   WBC 7.3 04/14/2018   HGB 10.8 (L) 04/14/2018   HCT  32.8 (L) 04/14/2018   MCV 97 04/14/2018   PLT 260 04/14/2018    Recent Labs  Lab 04/17/18 0558  NA 132*  K 4.2  CL 96*  CO2 20*  BUN 10  CREATININE 0.74  CALCIUM 8.2*  GLUCOSE 324*    Discharge Medications:  Allergies as of 04/17/2018      Reactions   Canagliflozin Shortness Of Breath   Tetanus Toxoids    Infection at injection site      Medication List    TAKE these medications   acetaminophen 650 MG CR tablet Commonly known as:  TYLENOL Take 1,300 mg by mouth daily as needed for pain.   albuterol 108 (90 Base) MCG/ACT inhaler Commonly known as:  PROVENTIL HFA;VENTOLIN HFA Inhale 2 puffs into the lungs  every 6 (six) hours as needed for wheezing or shortness of breath.   allopurinol 300 MG tablet Commonly known as:  ZYLOPRIM Take 300 mg by mouth 3 (three) times daily. Notes to patient:  This is listed as a twice daily medicine by report, continue twice daily until you see your primary doctor on Monday to clarify all of your medicines as we discussed   ALPRAZolam 0.5 MG tablet Commonly known as:  XANAX Take 0.5 mg by mouth at bedtime as needed for anxiety or sleep.   atorvastatin 40 MG tablet Commonly known as:  LIPITOR Take 40 mg by mouth daily.   carvedilol 25 MG tablet Commonly known as:  COREG Take 25 mg by mouth 2 (two) times daily with a meal.   cetirizine 10 MG tablet Commonly known as:  ZYRTEC Take 10 mg by mouth daily.   cyclobenzaprine 10 MG tablet Commonly known as:  FLEXERIL Take 10 mg by mouth 3 (three) times daily.   doxepin 50 MG capsule Commonly known as:  SINEQUAN Take 50 mg by mouth at bedtime as needed (sleep).   estrogens (conjugated) 1.25 MG tablet Commonly known as:  PREMARIN Take 1.25 mg by mouth daily.   ferrous sulfate 325 (65 FE) MG tablet Take 325 mg by mouth daily with breakfast.   fluticasone 50 MCG/ACT nasal spray Commonly known as:  FLONASE Place 1 spray into both nostrils daily as needed for allergies.   furosemide 20 MG tablet Commonly known as:  LASIX Take 40 mg by mouth daily.   gabapentin 300 MG capsule Commonly known as:  NEURONTIN Take 300 mg by mouth 3 (three) times daily.   insulin aspart 100 UNIT/ML injection Commonly known as:  novoLOG Inject 25 Units into the skin 3 (three) times daily before meals.   LEVEMIR FLEXTOUCH 100 UNIT/ML Pen Generic drug:  Insulin Detemir Inject 40 Units into the skin 2 (two) times daily.   metFORMIN 1000 MG tablet Commonly known as:  GLUCOPHAGE Take 1,000 mg by mouth 2 (two) times daily with a meal.   omeprazole 40 MG capsule Commonly known as:  PRILOSEC Take 40 mg by mouth daily.    oxyCODONE-acetaminophen 10-325 MG tablet Commonly known as:  PERCOCET Take 1 tablet by mouth 3 (three) times daily.   OZEMPIC (0.25 OR 0.5 MG/DOSE) 2 MG/1.5ML Sopn Generic drug:  Semaglutide(0.25 or 0.5MG /DOS) Inject 0.5 mg into the skin every Wednesday.   potassium chloride SA 20 MEQ tablet Commonly known as:  K-DUR,KLOR-CON Take 20 mEq by mouth daily.   ramipril 10 MG capsule Commonly known as:  ALTACE Take 10 mg by mouth daily.   sertraline 100 MG tablet Commonly known as:  ZOLOFT Take 100 mg  by mouth daily.   triamcinolone cream 0.1 % Commonly known as:  KENALOG Apply 1 application topically 2 (two) times daily as needed (dry skin).   Vitamin D (Ergocalciferol) 1.25 MG (50000 UT) Caps capsule Commonly known as:  DRISDOL Take 50,000 Units by mouth every Monday.   warfarin 1 MG tablet Commonly known as:  COUMADIN Take 1 mg by mouth See admin instructions. Take with the 6 mg tablet to equal 7 mg daily in the evening Notes to patient:  Today, Thursday 04/17/2018, and tomorrow, Friday 04/18/2018 take 2&1/2 tabs (for 2.5mg ) each day, then on Saturday resume your usual 6mg  total daily dose   warfarin 6 MG tablet Commonly known as:  COUMADIN Take 6 mg by mouth See admin instructions. Take with 1 mg tablet to equal 7 mg daily in the evening Notes to patient:  Do not resume your usual dose until Saturday 04/19/2018 as we discussed       Disposition:  Home  Discharge Instructions    Diet - low sodium heart healthy   Complete by:  As directed    Increase activity slowly   Complete by:  As directed      Follow-up Information    [facility] Follow up.   Specialty:  Cardiology Why:  04/30/2018 @ 9:02AM, wound check visit Contact information: [address], [address] [place] [address] [phone]       Hillis Range, MD Follow up.   Specialty:  Cardiology Why:  07/21/2018 @ 2:30PM Contact information: [address] [place] [address] [phone]        Dennard Schaumann, MD Follow up.   Specialty:  Internal Medicine Why:  04/21/2018 @ 10:45AM.  for warfarin check, and medication review.  Please bring in all of your medicines/bottles/list to review with your doctor as we discussed          Duration of Discharge Encounter: Greater than 30 minutes including physician time.  Signed, Francis Dowse, PA-C 04/17/2018 10:46 AM   She is doing very well this am.  CXR reveals stable leads, no ptx. Device interrogation is personally reviewed and normal  DC to home with routine wound care and follow-up. She is instructed to have INR follow-up next week.  Also to follow-up with PCP for elevated blood sugars.  Hillis Range MD, [facility] 04/17/2018 12:41 PM

## 2018-04-16 NOTE — Progress Notes (Signed)
Patient has been non compliant with maintaining bedrest (with exception of BR privileges).   Patient has been up from bed moving about her room, to [facility], to recliner. This movement has disrupted being able to capture sequential vital signs.  Dressing to left shoulder is dry and intact. Pt in recliner chair at this time without c/o. Will continue to monitor.

## 2018-04-16 NOTE — Progress Notes (Signed)
Pt is now walking around the room moving left arm. Pt has been educated on why she should stay in bed and only get up for bathroom privileges. Pt still not receptive towards education.

## 2018-04-16 NOTE — Progress Notes (Signed)
Rn came in room for bedside report to find patient non compliant with bedrest, up in chair, moving left arm, non compliant to blood pressure/vitals.

## 2018-04-17 ENCOUNTER — Ambulatory Visit (HOSPITAL_COMMUNITY): Payer: Medicaid Other

## 2018-04-17 DIAGNOSIS — I130 Hypertensive heart and chronic kidney disease with heart failure and stage 1 through stage 4 chronic kidney disease, or unspecified chronic kidney disease: Secondary | ICD-10-CM | POA: Diagnosis not present

## 2018-04-17 DIAGNOSIS — I428 Other cardiomyopathies: Secondary | ICD-10-CM | POA: Diagnosis not present

## 2018-04-17 DIAGNOSIS — Z006 Encounter for examination for normal comparison and control in clinical research program: Secondary | ICD-10-CM | POA: Diagnosis not present

## 2018-04-17 DIAGNOSIS — I5022 Chronic systolic (congestive) heart failure: Secondary | ICD-10-CM | POA: Diagnosis not present

## 2018-04-17 DIAGNOSIS — T82118A Breakdown (mechanical) of other cardiac electronic device, initial encounter: Secondary | ICD-10-CM | POA: Diagnosis not present

## 2018-04-17 LAB — GLUCOSE, CAPILLARY
Glucose-Capillary: 290 mg/dL — ABNORMAL HIGH (ref 70–99)
Glucose-Capillary: 253 mg/dL — ABNORMAL HIGH (ref 70–99)
Glucose-Capillary: 363 mg/dL — ABNORMAL HIGH (ref 70–99)

## 2018-04-17 LAB — BASIC METABOLIC PANEL
Anion gap: 16 — ABNORMAL HIGH (ref 5–15)
BUN: 10 mg/dL (ref 8–23)
CO2: 20 mmol/L — ABNORMAL LOW (ref 22–32)
Calcium: 8.2 mg/dL — ABNORMAL LOW (ref 8.9–10.3)
Chloride: 96 mmol/L — ABNORMAL LOW (ref 98–111)
Creatinine, Ser: 0.74 mg/dL (ref 0.44–1.00)
Glucose, Bld: 324 mg/dL — ABNORMAL HIGH (ref 70–99)
Potassium: 4.2 mmol/L (ref 3.5–5.1)
Sodium: 132 mmol/L — ABNORMAL LOW (ref 135–145)

## 2018-04-17 LAB — PROTIME-INR
INR: 2.45
Prothrombin Time: 26.2 seconds — ABNORMAL HIGH (ref 11.4–15.2)

## 2018-04-17 MED FILL — Gentamicin Sulfate Inj 40 MG/ML: INTRAMUSCULAR | Qty: 80 | Status: AC

## 2018-04-17 NOTE — Discharge Instructions (Signed)
Supplemental Discharge Instructions for  Pacemaker/Defibrillator Patients  Activity No heavy lifting or vigorous activity with your left/right arm for 6 to 8 weeks.  Do not raise your left/right arm above your head for one week.  Gradually raise your affected arm as drawn below.             04/20/2018                  04/21/2018                 04/22/2018               04/23/2018 __  NO DRIVING for  1 week   ; you may begin driving on  2/29/7989  .  WOUND CARE - Keep the wound area clean and dry.  Do not get this area wet, no showers until cleared to at your wound check visit. - The tape/steri-strips on your wound will fall off; do not pull them off.  No bandage is needed on the site.  DO  NOT apply any creams, oils, or ointments to the wound area. - If you notice any drainage or discharge from the wound, any swelling or bruising at the site, or you develop a fever > 101? F after you are discharged home, call the office at once.  Special Instructions - You are still able to use cellular telephones; use the ear opposite the side where you have your pacemaker/defibrillator.  Avoid carrying your cellular phone near your device. - When traveling through airports, show security personnel your identification card to avoid being screened in the metal detectors.  Ask the security personnel to use the hand wand. - Avoid arc welding equipment, MRI testing (magnetic resonance imaging), TENS units (transcutaneous nerve stimulators).  Call the office for questions about other devices. - Avoid electrical appliances that are in poor condition or are not properly grounded. - Microwave ovens are safe to be near or to operate.  Additional information for defibrillator patients should your device go off: - If your device goes off ONCE and you feel fine afterward, notify the device clinic nurses. - If your device goes off ONCE and you do not feel well afterward, call 911. - If your device goes off TWICE,  call 911. - If your device goes off THREE times in one day, call 911.  DO NOT DRIVE YOURSELF OR A FAMILY MEMBER WITH A DEFIBRILLATOR TO THE HOSPITAL--CALL 911.      Information on my medicine - Coumadin   (Warfarin)  Why was Coumadin prescribed for you? Coumadin was prescribed for you because you have a blood clot or a medical condition that can cause an increased risk of forming blood clots. Blood clots can cause serious health problems by blocking the flow of blood to the heart, lung, or brain. Coumadin can prevent harmful blood clots from forming. As a reminder your indication for Coumadin is:   Stroke Prevention Because Of Atrial Fibrillation  What test will check on my response to Coumadin? While on Coumadin (warfarin) you will need to have an INR test regularly to ensure that your dose is keeping you in the desired range. The INR (international normalized ratio) number is calculated from the result of the laboratory test called prothrombin time (PT).  If an INR APPOINTMENT HAS NOT ALREADY BEEN MADE FOR YOU please schedule an appointment to have this lab work done by your health care provider within 7 days. Your INR goal  is usually a number between:  2 to 3 or your provider may give you a more narrow range like 2-2.5.  Ask your health care provider during an office visit what your goal INR is.  What  do you need to  know  About  COUMADIN? Take Coumadin (warfarin) exactly as prescribed by your healthcare provider about the same time each day.  DO NOT stop taking without talking to the doctor who prescribed the medication.  Stopping without other blood clot prevention medication to take the place of Coumadin may increase your risk of developing a new clot or stroke.  Get refills before you run out.  What do you do if you miss a dose? If you miss a dose, take it as soon as you remember on the same day then continue your regularly scheduled regimen the next day.  Do not take two doses of  Coumadin at the same time.  Important Safety Information A possible side effect of Coumadin (Warfarin) is an increased risk of bleeding. You should call your healthcare provider right away if you experience any of the following: ? Bleeding from an injury or your nose that does not stop. ? Unusual colored urine (red or dark brown) or unusual colored stools (red or black). ? Unusual bruising for unknown reasons. ? A serious fall or if you hit your head (even if there is no bleeding).  Some foods or medicines interact with Coumadin (warfarin) and might alter your response to warfarin. To help avoid this: ? Eat a balanced diet, maintaining a consistent amount of Vitamin K. ? Notify your provider about major diet changes you plan to make. ? Avoid alcohol or limit your intake to 1 drink for women and 2 drinks for men per day. (1 drink is 5 oz. wine, 12 oz. beer, or 1.5 oz. liquor.)  Make sure that ANY health care provider who prescribes medication for you knows that you are taking Coumadin (warfarin).  Also make sure the healthcare provider who is monitoring your Coumadin knows when you have started a new medication including herbals and non-prescription products.  Coumadin (Warfarin)  Major Drug Interactions  Increased Warfarin Effect Decreased Warfarin Effect  Alcohol (large quantities) Antibiotics (esp. Septra/Bactrim, Flagyl, Cipro) Amiodarone (Cordarone) Aspirin (ASA) Cimetidine (Tagamet) Megestrol (Megace) NSAIDs (ibuprofen, naproxen, etc.) Piroxicam (Feldene) Propafenone (Rythmol SR) Propranolol (Inderal) Isoniazid (INH) Posaconazole (Noxafil) Barbiturates (Phenobarbital) Carbamazepine (Tegretol) Chlordiazepoxide (Librium) Cholestyramine (Questran) Griseofulvin Oral Contraceptives Rifampin Sucralfate (Carafate) Vitamin K   Coumadin (Warfarin) Major Herbal Interactions  Increased Warfarin Effect Decreased Warfarin Effect  Garlic Ginseng Ginkgo biloba Coenzyme Q10 Green  tea St. Johns wort    Coumadin (Warfarin) FOOD Interactions  Eat a consistent number of servings per week of foods HIGH in Vitamin K (1 serving =  cup)  Collards (cooked, or boiled & drained) Kale (cooked, or boiled & drained) Mustard greens (cooked, or boiled & drained) Parsley *serving size only =  cup Spinach (cooked, or boiled & drained) Swiss chard (cooked, or boiled & drained) Turnip greens (cooked, or boiled & drained)  Eat a consistent number of servings per week of foods MEDIUM-HIGH in Vitamin K (1 serving = 1 cup)  Asparagus (cooked, or boiled & drained) Broccoli (cooked, boiled & drained, or raw & chopped) Brussel sprouts (cooked, or boiled & drained) *serving size only =  cup Lettuce, raw (green leaf, endive, romaine) Spinach, raw Turnip greens, raw & chopped   These websites have more information on Coumadin (warfarin):  http://www.king-russell.com/; https://www.hines.net/;

## 2018-04-17 NOTE — Progress Notes (Signed)
Discussed discharge instruction with patient including medications and follow up appointments and care instructions for surgical site.   Sent home instructions with patient

## 2018-04-21 ENCOUNTER — Ambulatory Visit: Payer: Medicaid Other | Admitting: Internal Medicine

## 2018-04-21 ENCOUNTER — Encounter (HOSPITAL_COMMUNITY): Payer: Self-pay | Admitting: Internal Medicine

## 2018-04-21 ENCOUNTER — Telehealth: Payer: Self-pay | Admitting: Internal Medicine

## 2018-04-21 NOTE — Telephone Encounter (Signed)
Patient has a cold and would like antibiotics. Advised her to call her PCP, but she insisted she did not want to because she could not go. She said she did not have a cold until after she was d/c from the hospital, and she was on antibiotics in the hospital

## 2018-04-21 NOTE — Telephone Encounter (Signed)
Left detailed message per DPR.  Advised Pt that we do not prescribe antibiotics for colds.  Advised Pt should follow up with her PCP or go to Urgent Care for symptoms of a cold.

## 2018-04-21 NOTE — Telephone Encounter (Signed)
Looks like Primary Card is Dr. Hanley Hays, though no Cardiologist Dr. Hanley Hays in [facility] system. Pt does also see Dr. Johney Frame. I will route this message to Roney Mans, RN for Dr. Johney Frame.

## 2018-05-14 LAB — CUP PACEART INCLINIC DEVICE CHECK
Battery Remaining Longevity: 21 mo
Brady Statistic RA Percent Paced: 0 %
Brady Statistic RV Percent Paced: 97 %
Date Time Interrogation Session: 20200108213522
HighPow Impedance: 51.3697
Implantable Lead Implant Date: 20140429
Implantable Lead Location: 753860
Implantable Lead Model: 7121
Implantable Pulse Generator Implant Date: 20140429
Lead Channel Impedance Value: 437.5 Ohm
Lead Channel Impedance Value: 662.5 Ohm
Lead Channel Pacing Threshold Amplitude: 0.75 V
Lead Channel Pacing Threshold Amplitude: 1.5 V
Lead Channel Pacing Threshold Pulse Width: 0.5 ms
Lead Channel Pacing Threshold Pulse Width: 0.5 ms
Lead Channel Sensing Intrinsic Amplitude: 12 mV
Lead Channel Sensing Intrinsic Amplitude: 3.4 mV
Lead Channel Setting Pacing Amplitude: 0.25 V
Lead Channel Setting Pacing Amplitude: 2.5 V
Lead Channel Setting Pacing Pulse Width: 0.05 ms
Lead Channel Setting Sensing Sensitivity: 0.5 mV
MDC IDC MSMT LEADCHNL RV IMPEDANCE VALUE: 2437.5 Ohm
MDC IDC SET LEADCHNL LV PACING PULSEWIDTH: 0.5 ms
MDC IDC SET LEADCHNL RA PACING AMPLITUDE: 2 V
Pulse Gen Serial Number: 7094184

## 2018-05-15 ENCOUNTER — Ambulatory Visit (INDEPENDENT_AMBULATORY_CARE_PROVIDER_SITE_OTHER): Payer: Medicare Other | Admitting: Nurse Practitioner

## 2018-05-15 DIAGNOSIS — I428 Other cardiomyopathies: Secondary | ICD-10-CM

## 2018-05-15 LAB — CUP PACEART INCLINIC DEVICE CHECK
Date Time Interrogation Session: 20200305103904
Implantable Lead Implant Date: 20140429
Implantable Lead Implant Date: 20200205
Implantable Lead Location: 753858
Implantable Lead Location: 753859
Implantable Lead Location: 753860
Implantable Pulse Generator Implant Date: 20200205
MDC IDC LEAD IMPLANT DT: 20140617
Pulse Gen Serial Number: 9879509

## 2018-05-15 NOTE — Progress Notes (Signed)

## 2018-07-18 ENCOUNTER — Telehealth: Payer: Self-pay

## 2018-07-18 NOTE — Telephone Encounter (Signed)
Called pt regarding appt on 07/21/18. I was unable to leave message.

## 2018-07-21 ENCOUNTER — Encounter: Payer: Self-pay | Admitting: Internal Medicine

## 2018-07-21 ENCOUNTER — Telehealth (INDEPENDENT_AMBULATORY_CARE_PROVIDER_SITE_OTHER): Payer: Medicare Other | Admitting: Internal Medicine

## 2018-07-21 ENCOUNTER — Ambulatory Visit (INDEPENDENT_AMBULATORY_CARE_PROVIDER_SITE_OTHER): Payer: Medicare Other | Admitting: *Deleted

## 2018-07-21 VITALS — BP 113/81 | HR 72

## 2018-07-21 DIAGNOSIS — I519 Heart disease, unspecified: Secondary | ICD-10-CM | POA: Diagnosis not present

## 2018-07-21 DIAGNOSIS — I428 Other cardiomyopathies: Secondary | ICD-10-CM

## 2018-07-21 DIAGNOSIS — I359 Nonrheumatic aortic valve disorder, unspecified: Secondary | ICD-10-CM | POA: Diagnosis not present

## 2018-07-21 NOTE — Progress Notes (Signed)
Electrophysiology TeleHealth Note   Due to national recommendations of social distancing due to COVID 19, an audio telehealth visit is felt to be most appropriate for this patient at this time.  Verbal consent obtained from the patient today.  She has very limited Internet access and therefore cannot do a virtual visit today.   Date:  07/21/2018   ID:  Donna Hopkins, DOB Jan 12, 1956, MRN [id number]  Location: patient's home  Provider location: [place]  Evaluation Performed: Follow-up visit Primary Cardiologist: Dr Hanley Hays PCP:  Dennard Schaumann, MD (Inactive)  Electrophysiologist:  Dr Johney Frame  Chief Complaint:  CHF  History of Present Illness:    Donna Hopkins is a 62 y.o. female who presents via audio/video conferencing for a telehealth visit today.  Since her recent lead revision, the patient reports doing very well.  Today, she denies symptoms of palpitations, chest pain, shortness of breath,  lower extremity edema, dizziness, presyncope, or syncope.  The patient is otherwise without complaint today.  The patient denies symptoms of fevers, chills, cough, or new SOB worrisome for COVID 19.  Past Medical History:  Diagnosis Date   Carotid stenosis, asymptomatic, bilateral    Chronic kidney disease, stage III (moderate) (HCC)    Chronic low back pain    Clinical systolic heart failure, chronic (HCC)    Depression    Diabetes mellitus    Eosinophilic gastritis    GERD (gastroesophageal reflux disease)    Gout, renal disease    H/O mechanical aortic valve replacement    on coumadin   Hx of aortic valve replacement    Hypertension    Hypocalcemia    Hyponatremia    ICD (implantable cardioverter-defibrillator) in place    Medication refill    Mixed hyperlipidemia    Neuropathy    Obesity    OP (osteoporosis)    Peripheral vascular disease, unspecified (HCC)    Persistent insomnia    Personal history of colonic polyps    Pulmonary  hypertension (HCC)    Snoring    declines sleep study   Tricuspid regurgitation    Uncontrolled type 2 diabetes mellitus with complication (HCC)    Valgus foot    Vertigo, benign positional    Vitamin D deficiency     Past Surgical History:  Procedure Laterality Date   ABDOMINAL HYSTERECTOMY     BACK SURGERY     BI-VENTRICULAR IMPLANTABLE CARDIOVERTER DEFIBRILLATOR  (CRT-D)  07/08/2012   SJM Quadra Assura BiV ICD implanted by Dr Reed Pandy at [facility]   CARDIAC VALVE REPLACEMENT     LEAD INSERTION N/A 04/16/2018   Successful BiV ICD system revision with a new RV lead and pulse generator replacement with a [facility] Quadra Assura MP model [phone] ICD    Current Outpatient Medications  Medication Sig Dispense Refill   acetaminophen (TYLENOL) 650 MG CR tablet Take 1,300 mg by mouth daily as needed for pain.     albuterol (PROVENTIL HFA;VENTOLIN HFA) 108 (90 BASE) MCG/ACT inhaler Inhale 2 puffs into the lungs every 6 (six) hours as needed for wheezing or shortness of breath.      allopurinol (ZYLOPRIM) 300 MG tablet Take 300 mg by mouth 3 (three) times daily.      ALPRAZolam (XANAX) 0.5 MG tablet Take 0.5 mg by mouth at bedtime as needed for anxiety or sleep.     atorvastatin (LIPITOR) 40 MG tablet Take 40 mg by mouth daily.     carvedilol (COREG) 25 MG tablet  Take 25 mg by mouth 2 (two) times daily with a meal.      cetirizine (ZYRTEC) 10 MG tablet Take 10 mg by mouth daily.     cyclobenzaprine (FLEXERIL) 10 MG tablet Take 10 mg by mouth 3 (three) times daily.      doxepin (SINEQUAN) 50 MG capsule Take 50 mg by mouth at bedtime as needed (sleep).      estrogens, conjugated, (PREMARIN) 1.25 MG tablet Take 1.25 mg by mouth daily.       ferrous sulfate 325 (65 FE) MG tablet Take 325 mg by mouth daily with breakfast.     fluticasone (FLONASE) 50 MCG/ACT nasal spray Place 1 spray into both nostrils daily as needed for allergies.      furosemide (LASIX) 20 MG  tablet Take 40 mg by mouth daily.     gabapentin (NEURONTIN) 300 MG capsule Take 300 mg by mouth 3 (three) times daily.     insulin aspart (NOVOLOG) 100 UNIT/ML injection Inject 25 Units into the skin 3 (three) times daily before meals.     JARDIANCE 25 MG TABS tablet Take 1 tablet by mouth daily.     LEVEMIR FLEXTOUCH 100 UNIT/ML Pen Inject 40 Units into the skin 2 (two) times daily.  12   omeprazole (PRILOSEC) 40 MG capsule Take 40 mg by mouth daily.   6   oxyCODONE-acetaminophen (PERCOCET) 10-325 MG per tablet Take 1 tablet by mouth 3 (three) times daily.     OZEMPIC, 0.25 OR 0.5 MG/DOSE, 2 MG/1.5ML SOPN Inject 0.5 mg into the skin every Wednesday.   3   potassium chloride SA (K-DUR,KLOR-CON) 20 MEQ tablet Take 20 mEq by mouth daily.       ramipril (ALTACE) 10 MG capsule Take 10 mg by mouth daily.     sertraline (ZOLOFT) 100 MG tablet Take 100 mg by mouth daily.       triamcinolone cream (KENALOG) 0.1 % Apply 1 application topically 2 (two) times daily as needed (dry skin).      warfarin (COUMADIN) 1 MG tablet Take 1 mg by mouth See admin instructions. Take with the 6 mg tablet to equal 7 mg daily in the evening     warfarin (COUMADIN) 6 MG tablet Take 6 mg by mouth See admin instructions. Take with 1 mg tablet to equal 7 mg daily in the evening     No current facility-administered medications for this visit.     Allergies:   Canagliflozin and Tetanus toxoids   Social History:  The patient  reports that she has quit smoking. She has never used smokeless tobacco. She reports that she does not drink alcohol or use drugs.   Family History:  The patient's  family history includes Anxiety disorder in her brother and sister; Colon polyps in her sister; Depression in her brother; Diabetes Mellitus II in her sister; Heart disease in her brother and father; Hyperlipidemia in her brother and sister; Hypertension in her father and sister.   ROS:  Please see the history of present  illness.   All other systems are personally reviewed and negative.    Exam:    Vital Signs:  BP 113/81    Pulse 72   Well sounding   Labs/Other Tests and Data Reviewed:    Recent Labs: 04/14/2018: Hemoglobin 10.8; Platelets 260 04/17/2018: BUN 10; Creatinine, Ser 0.74; Potassium 4.2; Sodium 132   Wt Readings from Last 3 Encounters:  04/17/18 253 lb 12.8 oz (115.1 kg)  03/19/18 254  lb (115.2 kg)  01/17/18 251 lb 6.4 oz (114 kg)     Other studies personally reviewed: Additional studies/ records that were reviewed today include: procedure notes, my prior office notes  Review of the above records today demonstrates: as above Prior radiographs: CXR 04/17/2018- CXR reveals stable leads, no ptx    ASSESSMENT & PLAN:    1.  RV lead fracture Doing very well s/p lead revision 04/2018  2. Chronic systolic dysfunction euvolemic Enroll in ICM device clinic I will follow remotely  2. S/p AVR Followed by Dr Hanley Hays She says that she has an  Echo pending  3. COVID 19 screen The patient denies symptoms of COVID 19 at this time.  The importance of social distancing was discussed today.  Follow-up:  12 months with EP NP Next remote: due  Current medicines are reviewed at length with the patient today.   The patient does not have concerns regarding her medicines.  The following changes were made today:  none  Labs/ tests ordered today include:  No orders of the defined types were placed in this encounter.   Patient Risk:  after full review of this patients clinical status, I feel that they are at moderate risk at this time.  Today, I have spent 20 minutes with the patient with telehealth technology discussing CHF .    Randolm Idol, MD  07/21/2018 3:05 PM     [facility] [address] [place] [address] [phone] (office) [phone] (fax)

## 2018-07-24 LAB — CUP PACEART REMOTE DEVICE CHECK
Battery Remaining Longevity: 78 mo
Battery Remaining Percentage: 95 %
Brady Statistic RA Percent Paced: 1 %
Brady Statistic RV Percent Paced: 100 %
Date Time Interrogation Session: 20200514164928
HighPow Impedance: 70 Ohm
Implantable Lead Implant Date: 20140429
Implantable Lead Implant Date: 20140617
Implantable Lead Implant Date: 20200205
Implantable Lead Location: 753858
Implantable Lead Location: 753859
Implantable Lead Location: 753860
Implantable Pulse Generator Implant Date: 20200205
Lead Channel Impedance Value: 450 Ohm
Lead Channel Impedance Value: 600 Ohm
Lead Channel Impedance Value: 630 Ohm
Lead Channel Pacing Threshold Amplitude: 0.875 V
Lead Channel Pacing Threshold Amplitude: 1.5 V
Lead Channel Pacing Threshold Pulse Width: 0.5 ms
Lead Channel Pacing Threshold Pulse Width: 0.5 ms
Lead Channel Sensing Intrinsic Amplitude: 3.9 mV
Lead Channel Setting Pacing Amplitude: 2 V
Lead Channel Setting Pacing Amplitude: 2.375
Lead Channel Setting Pacing Amplitude: 2.5 V
Lead Channel Setting Pacing Pulse Width: 0.5 ms
Lead Channel Setting Pacing Pulse Width: 0.5 ms
Lead Channel Setting Sensing Sensitivity: 0.5 mV
Pulse Gen Serial Number: 9879509

## 2018-07-30 ENCOUNTER — Telehealth: Payer: Self-pay | Admitting: Student

## 2018-07-30 NOTE — Telephone Encounter (Signed)
Made contact for ICM enrollment. Pt agrees to proceed. Will forward to Randon Goldsmith, RN for scheduling.   Thank you!   Casimiro Needle [address]" [place], PA-C 07/30/2018 11:05 AM

## 2018-07-31 ENCOUNTER — Other Ambulatory Visit: Payer: Self-pay

## 2018-07-31 NOTE — Progress Notes (Signed)
Remote ICD transmission.  ° °

## 2018-08-18 ENCOUNTER — Telehealth: Payer: Self-pay | Admitting: Internal Medicine

## 2018-08-18 NOTE — Telephone Encounter (Signed)
LMOM to call back. Will try again shortly.

## 2018-08-18 NOTE — Telephone Encounter (Signed)
Pt denies wire protruding or sticking out through skin. She states she feels like her "device box" is sticking out of her chest more than usual.  It causes her tenderness and discomfort in her arm when she lies down on it. No bleeding, drainage, fever, or chills.  She states she has had a slight cough for about 2 weeks. No known contact with COVID pt.   Offered her appt on 08/21/2018 at 12, which she accepted. Knows to call back with any worsening symptoms or changing complaints.    Legrand Como [address]" [place], PA-C 08/18/2018 1:03 PM

## 2018-08-18 NOTE — Telephone Encounter (Signed)
Patient states she has a wire from her pacemaker poking through her skin. She states it is uncomfortable and she is in pain. She is leaving and please call on her cell phone.

## 2018-08-18 NOTE — Telephone Encounter (Signed)
Follow Up:...      Retirning your call from today.

## 2018-08-20 ENCOUNTER — Telehealth: Payer: Self-pay

## 2018-08-20 NOTE — Telephone Encounter (Signed)
° ° °  COVID-19 Pre-Screening Questions:   In the past 7 to 10 days have you had a cough,  shortness of breath, headache, congestion, fever (100 or greater) body aches, chills, sore throat, or sudden loss of taste or sense of smell?  Have you been around anyone with known Covid 19.  Have you been around anyone who is awaiting Covid 19 test results in the past 7 to 10 days?  Have you been around anyone who has been exposed to Covid 19, or has mentioned symptoms of Covid 19 within the past 7 to 10 days?  If you have any concerns/questions about symptoms patients report during screening (either on the phone or at threshold). Contact the provider seeing the patient or DOD for further guidance.  If neither are available contact a member of the leadership team.           LMOVM for pt to call me back.

## 2018-08-21 ENCOUNTER — Ambulatory Visit (INDEPENDENT_AMBULATORY_CARE_PROVIDER_SITE_OTHER): Payer: Medicare Other | Admitting: Student

## 2018-08-21 ENCOUNTER — Other Ambulatory Visit: Payer: Self-pay

## 2018-08-21 DIAGNOSIS — I5022 Chronic systolic (congestive) heart failure: Secondary | ICD-10-CM | POA: Diagnosis not present

## 2018-08-21 LAB — CUP PACEART INCLINIC DEVICE CHECK
Battery Remaining Longevity: 79 mo
Brady Statistic RA Percent Paced: 0.01 %
Brady Statistic RV Percent Paced: 99.58 %
Date Time Interrogation Session: 20200611131114
HighPow Impedance: 65.25 Ohm
Implantable Lead Implant Date: 20140429
Implantable Lead Implant Date: 20140617
Implantable Lead Implant Date: 20200205
Implantable Lead Location: 753858
Implantable Lead Location: 753859
Implantable Lead Location: 753860
Implantable Pulse Generator Implant Date: 20200205
Lead Channel Impedance Value: 450 Ohm
Lead Channel Impedance Value: 612.5 Ohm
Lead Channel Impedance Value: 637.5 Ohm
Lead Channel Pacing Threshold Amplitude: 0.75 V
Lead Channel Pacing Threshold Amplitude: 0.75 V
Lead Channel Pacing Threshold Amplitude: 1 V
Lead Channel Pacing Threshold Amplitude: 1 V
Lead Channel Pacing Threshold Amplitude: 1.5 V
Lead Channel Pacing Threshold Amplitude: 1.5 V
Lead Channel Pacing Threshold Pulse Width: 0.5 ms
Lead Channel Pacing Threshold Pulse Width: 0.5 ms
Lead Channel Pacing Threshold Pulse Width: 0.5 ms
Lead Channel Pacing Threshold Pulse Width: 0.5 ms
Lead Channel Pacing Threshold Pulse Width: 0.5 ms
Lead Channel Pacing Threshold Pulse Width: 0.5 ms
Lead Channel Sensing Intrinsic Amplitude: 11.3 mV
Lead Channel Sensing Intrinsic Amplitude: 3.9 mV
Lead Channel Setting Pacing Amplitude: 2 V
Lead Channel Setting Pacing Amplitude: 2.5 V
Lead Channel Setting Pacing Amplitude: 2.625
Lead Channel Setting Pacing Pulse Width: 0.5 ms
Lead Channel Setting Pacing Pulse Width: 0.5 ms
Lead Channel Setting Sensing Sensitivity: 0.5 mV
Pulse Gen Serial Number: 9879509

## 2018-08-21 NOTE — Progress Notes (Signed)
CRT-D device check in office as patient felt her device was protruding more than usual. Device site stable. No evidence of infection. Thresholds and sensing consistent with previous device measurements. Lead impedance trends stable over time. 9 mode switch episodes recorded. Occasional episodes of AT, longest 12 minutes. On Coumadin. Burden <1%. No ventricular arrhythmia episodes recorded. Patient bi-ventricularly pacing >99% of the time. Device programmed with appropriate safety margins. Heart failure diagnostics reviewed and trends are stable for patient.  Estimated longevity 6 yr, 7 mo.  Continue remote follow ups (ICM) and annual office visits, next due 07/2019.    Legrand Como [address] [place], PA-C 08/21/2018 1:11 PM

## 2018-09-03 ENCOUNTER — Ambulatory Visit (INDEPENDENT_AMBULATORY_CARE_PROVIDER_SITE_OTHER): Payer: Medicare Other

## 2018-09-03 DIAGNOSIS — I5022 Chronic systolic (congestive) heart failure: Secondary | ICD-10-CM | POA: Diagnosis not present

## 2018-09-03 DIAGNOSIS — Z95810 Presence of automatic (implantable) cardiac defibrillator: Secondary | ICD-10-CM | POA: Diagnosis not present

## 2018-09-03 NOTE — Progress Notes (Signed)
EPIC Encounter for ICM Monitoring  Patient Name: Donna Hopkins is a 63 y.o. female Date: 09/03/2018 Primary Care Physican: Lorelei Pont, MD Primary Cardiologist: Claudie Leach Electrophysiologist: Allred Bi-V Pacing: >99%  Weight: unknown       1st ICM remote transmission.  Attempted call to patient and unable to reach.  Left message to return call. Transmission reviewed.    Corvue Thoracic impedance normal.   Prescribed: Furosemide 20 mg Take 40 mg by mouth daily.  Recommendations: Unable to reach.    Follow-up plan: ICM clinic phone appointment on 10/06/2018.    Copy of ICM check sent to Dr. Rayann Heman.   3 month ICM trend: 09/03/2018    1 Year ICM trend:       Rosalene Billings, RN 09/03/2018 4:09 PM

## 2018-10-06 ENCOUNTER — Ambulatory Visit (INDEPENDENT_AMBULATORY_CARE_PROVIDER_SITE_OTHER): Payer: Medicare Other

## 2018-10-06 DIAGNOSIS — Z95810 Presence of automatic (implantable) cardiac defibrillator: Secondary | ICD-10-CM | POA: Diagnosis not present

## 2018-10-06 DIAGNOSIS — I5022 Chronic systolic (congestive) heart failure: Secondary | ICD-10-CM | POA: Diagnosis not present

## 2018-10-07 ENCOUNTER — Telehealth: Payer: Self-pay

## 2018-10-07 NOTE — Telephone Encounter (Signed)
Remote ICM transmission received.  Attempted call to patient regarding ICM remote transmission and no answer.

## 2018-10-07 NOTE — Progress Notes (Signed)
EPIC Encounter for ICM Monitoring  Patient Name: Donna Hopkins is a 63 y.o. female Date: 10/07/2018 Primary Care Physican: Lorelei Pont, MD Primary Cardiologist: Claudie Leach Electrophysiologist: Allred Bi-V Pacing: >99%          Weight: unknown                                                           Attempted call to patient and unable to reach.   Transmission reviewed.    Corvue Thoracic impedance normal.   Prescribed: Furosemide 20 mg Take 40 mg by mouth daily.  Recommendations: Unable to reach.    Follow-up plan: ICM clinic phone appointment on 11/10/2018.    Copy of ICM check sent to Dr. Rayann Heman.    3 month ICM trend: 10/06/2018    1 Year ICM trend:       Rosalene Billings, RN 10/07/2018 4:57 PM

## 2018-10-22 ENCOUNTER — Ambulatory Visit (INDEPENDENT_AMBULATORY_CARE_PROVIDER_SITE_OTHER): Payer: Medicare Other | Admitting: *Deleted

## 2018-10-22 DIAGNOSIS — I428 Other cardiomyopathies: Secondary | ICD-10-CM | POA: Diagnosis not present

## 2018-10-23 LAB — CUP PACEART REMOTE DEVICE CHECK
Battery Remaining Longevity: 78 mo
Battery Remaining Percentage: 92 %
Battery Voltage: 3.11 V
Brady Statistic AP VP Percent: 1 %
Brady Statistic AP VS Percent: 1 %
Brady Statistic AS VP Percent: 99 %
Brady Statistic AS VS Percent: 1 %
Brady Statistic RA Percent Paced: 1 %
Date Time Interrogation Session: 20200812084023
HighPow Impedance: 68 Ohm
HighPow Impedance: 68 Ohm
Implantable Lead Implant Date: 20140429
Implantable Lead Implant Date: 20140617
Implantable Lead Implant Date: 20200205
Implantable Lead Location: 753858
Implantable Lead Location: 753859
Implantable Lead Location: 753860
Implantable Pulse Generator Implant Date: 20200205
Lead Channel Impedance Value: 460 Ohm
Lead Channel Impedance Value: 610 Ohm
Lead Channel Impedance Value: 660 Ohm
Lead Channel Pacing Threshold Amplitude: 0.75 V
Lead Channel Pacing Threshold Amplitude: 0.75 V
Lead Channel Pacing Threshold Amplitude: 1.375 V
Lead Channel Pacing Threshold Pulse Width: 0.5 ms
Lead Channel Pacing Threshold Pulse Width: 0.5 ms
Lead Channel Pacing Threshold Pulse Width: 0.5 ms
Lead Channel Sensing Intrinsic Amplitude: 11.3 mV
Lead Channel Sensing Intrinsic Amplitude: 3.6 mV
Lead Channel Setting Pacing Amplitude: 2 V
Lead Channel Setting Pacing Amplitude: 2.375
Lead Channel Setting Pacing Amplitude: 2.5 V
Lead Channel Setting Pacing Pulse Width: 0.5 ms
Lead Channel Setting Pacing Pulse Width: 0.5 ms
Lead Channel Setting Sensing Sensitivity: 0.5 mV
Pulse Gen Serial Number: 9879509

## 2018-10-31 ENCOUNTER — Encounter: Payer: Self-pay | Admitting: Cardiology

## 2018-10-31 NOTE — Progress Notes (Signed)
Remote ICD transmission.  ° °

## 2018-11-10 ENCOUNTER — Ambulatory Visit (INDEPENDENT_AMBULATORY_CARE_PROVIDER_SITE_OTHER): Payer: Medicare Other

## 2018-11-10 DIAGNOSIS — Z95810 Presence of automatic (implantable) cardiac defibrillator: Secondary | ICD-10-CM

## 2018-11-10 DIAGNOSIS — I5022 Chronic systolic (congestive) heart failure: Secondary | ICD-10-CM | POA: Diagnosis not present

## 2018-11-11 NOTE — Progress Notes (Signed)
EPIC Encounter for ICM Monitoring  Patient Name: Donna Hopkins is a 63 y.o. female Date: 11/11/2018 Primary Care Physican: Lorelei Pont, MD Primary Cardiologist:Rosario Electrophysiologist:Allred Bi-V Pacing:>99% Weight: 230 lbs   Spoke with patient.  She is feeling fine and did not have any complaints.  CorvueThoracic impedance normal.   Prescribed: Furosemide20 mgTake 40 mg by mouth daily.  Recommendations:No changes and encouraged to call if experiencing any fluid symptoms.  Follow-up plan: ICM clinic phone appointment on 01/05/2019.   91 day device clinic remote transmission 01/21/2019.      Copy of ICM check sent to Dr. Rayann Heman.   3 month ICM trend: 11/10/2018    1 Year ICM trend:       Rosalene Billings, RN 11/11/2018 11:08 AM

## 2019-01-05 ENCOUNTER — Ambulatory Visit (INDEPENDENT_AMBULATORY_CARE_PROVIDER_SITE_OTHER): Payer: Medicare Other

## 2019-01-05 DIAGNOSIS — Z95810 Presence of automatic (implantable) cardiac defibrillator: Secondary | ICD-10-CM | POA: Diagnosis not present

## 2019-01-05 DIAGNOSIS — I5022 Chronic systolic (congestive) heart failure: Secondary | ICD-10-CM | POA: Diagnosis not present

## 2019-01-06 ENCOUNTER — Telehealth: Payer: Self-pay

## 2019-01-06 NOTE — Progress Notes (Signed)
EPIC Encounter for ICM Monitoring  Patient Name: Donna Hopkins is a 63 y.o. female Date: 01/06/2019 Primary Care Physican: Lorelei Pont, MD Primary Cardiologist:Rosario Electrophysiologist:Allred Bi-V Pacing:>99% Weight: 230 lbs  Attempted call to patient and unable to reach.  Left detailed message per DPR regarding transmission. Transmission reviewed.   CorvueThoracic impedance normal.   Prescribed: Furosemide20 mgTake 40 mg by mouth daily.  Recommendations: Left voice mail with ICM number and encouraged to call if experiencing any fluid symptoms.  Follow-up plan: ICM clinic phone appointment on 02/10/2019.   91 day device clinic remote transmission 01/21/2019.    Copy of ICM check sent to Dr. Rayann Heman.   3 month ICM trend: 01/05/2019    1 Year ICM trend:       Rosalene Billings, RN 01/06/2019 4:04 PM

## 2019-01-06 NOTE — Telephone Encounter (Signed)
Remote ICM transmission received.  Attempted call to patient regarding ICM remote transmission and left detailed message per DPR.  Advised to return call for any fluid symptoms or questions. ° ° °

## 2019-01-21 ENCOUNTER — Ambulatory Visit (INDEPENDENT_AMBULATORY_CARE_PROVIDER_SITE_OTHER): Payer: Medicare Other | Admitting: *Deleted

## 2019-01-21 DIAGNOSIS — I519 Heart disease, unspecified: Secondary | ICD-10-CM

## 2019-01-21 DIAGNOSIS — I5022 Chronic systolic (congestive) heart failure: Secondary | ICD-10-CM

## 2019-01-21 LAB — CUP PACEART REMOTE DEVICE CHECK
Battery Remaining Longevity: 76 mo
Battery Remaining Percentage: 88 %
Battery Voltage: 3.07 V
Brady Statistic AP VP Percent: 1 %
Brady Statistic AP VS Percent: 1 %
Brady Statistic AS VP Percent: 99 %
Brady Statistic AS VS Percent: 1 %
Brady Statistic RA Percent Paced: 1 %
Date Time Interrogation Session: 20201111122608
HighPow Impedance: 73 Ohm
HighPow Impedance: 73 Ohm
Implantable Lead Implant Date: 20140429
Implantable Lead Implant Date: 20140617
Implantable Lead Implant Date: 20200205
Implantable Lead Location: 753858
Implantable Lead Location: 753859
Implantable Lead Location: 753860
Implantable Pulse Generator Implant Date: 20200205
Lead Channel Impedance Value: 480 Ohm
Lead Channel Impedance Value: 580 Ohm
Lead Channel Impedance Value: 640 Ohm
Lead Channel Pacing Threshold Amplitude: 0.75 V
Lead Channel Pacing Threshold Amplitude: 0.75 V
Lead Channel Pacing Threshold Amplitude: 1.625 V
Lead Channel Pacing Threshold Pulse Width: 0.5 ms
Lead Channel Pacing Threshold Pulse Width: 0.5 ms
Lead Channel Pacing Threshold Pulse Width: 0.5 ms
Lead Channel Sensing Intrinsic Amplitude: 12 mV
Lead Channel Sensing Intrinsic Amplitude: 4.5 mV
Lead Channel Setting Pacing Amplitude: 2 V
Lead Channel Setting Pacing Amplitude: 2.5 V
Lead Channel Setting Pacing Amplitude: 2.625
Lead Channel Setting Pacing Pulse Width: 0.5 ms
Lead Channel Setting Pacing Pulse Width: 0.5 ms
Lead Channel Setting Sensing Sensitivity: 0.5 mV
Pulse Gen Serial Number: 9879509

## 2019-02-10 ENCOUNTER — Telehealth: Payer: Self-pay

## 2019-02-10 NOTE — Telephone Encounter (Signed)
Left message for patient to remind of missed remote transmission.

## 2019-02-12 NOTE — Progress Notes (Signed)
Remote ICD transmission.  ° °

## 2019-02-16 NOTE — Progress Notes (Signed)
No ICM remote transmission received for 02/10/2019 and next ICM transmission scheduled for 03/23/2019.

## 2019-03-23 ENCOUNTER — Ambulatory Visit (INDEPENDENT_AMBULATORY_CARE_PROVIDER_SITE_OTHER): Payer: Medicare Other

## 2019-03-23 DIAGNOSIS — I5022 Chronic systolic (congestive) heart failure: Secondary | ICD-10-CM | POA: Diagnosis not present

## 2019-03-23 DIAGNOSIS — Z95810 Presence of automatic (implantable) cardiac defibrillator: Secondary | ICD-10-CM | POA: Diagnosis not present

## 2019-03-27 NOTE — Progress Notes (Signed)
EPIC Encounter for ICM Monitoring  Patient Name: Donna Hopkins is a 64 y.o. female Date: 03/27/2019 Primary Care Physican: Dennard Schaumann, MD Primary Cardiologist:Rosario Electrophysiologist:Allred Bi-V Pacing:98% Weight:230 lbs  Transmission reviewed.   CorvueThoracic impedance normal.   Prescribed: Furosemide20 mgTake 40 mg by mouth daily.  Recommendations: None  Follow-up plan: ICM clinic phone appointment on 04/28/2019.   91 day device clinic remote transmission 04/27/2019.      Copy of ICM check sent to Dr. Johney Frame.   3 month ICM trend: 03/23/2019    1 Year ICM trend:       Karie Soda, RN 03/27/2019 8:36 AM

## 2019-04-27 ENCOUNTER — Ambulatory Visit (INDEPENDENT_AMBULATORY_CARE_PROVIDER_SITE_OTHER): Payer: Medicare Other | Admitting: *Deleted

## 2019-04-27 DIAGNOSIS — I519 Heart disease, unspecified: Secondary | ICD-10-CM | POA: Diagnosis not present

## 2019-04-27 LAB — CUP PACEART REMOTE DEVICE CHECK
Battery Remaining Longevity: 72 mo
Battery Remaining Percentage: 85 %
Battery Voltage: 3.02 V
Brady Statistic AP VP Percent: 1 %
Brady Statistic AP VS Percent: 1 %
Brady Statistic AS VP Percent: 98 %
Brady Statistic AS VS Percent: 1 %
Brady Statistic RA Percent Paced: 1 %
Date Time Interrogation Session: 20210215084938
HighPow Impedance: 69 Ohm
HighPow Impedance: 69 Ohm
Implantable Lead Implant Date: 20140429
Implantable Lead Implant Date: 20140617
Implantable Lead Implant Date: 20200205
Implantable Lead Location: 753858
Implantable Lead Location: 753859
Implantable Lead Location: 753860
Implantable Pulse Generator Implant Date: 20200205
Lead Channel Impedance Value: 480 Ohm
Lead Channel Impedance Value: 580 Ohm
Lead Channel Impedance Value: 650 Ohm
Lead Channel Pacing Threshold Amplitude: 0.75 V
Lead Channel Pacing Threshold Amplitude: 0.875 V
Lead Channel Pacing Threshold Amplitude: 1.25 V
Lead Channel Pacing Threshold Pulse Width: 0.5 ms
Lead Channel Pacing Threshold Pulse Width: 0.5 ms
Lead Channel Pacing Threshold Pulse Width: 0.5 ms
Lead Channel Sensing Intrinsic Amplitude: 3.8 mV
Lead Channel Sensing Intrinsic Amplitude: 9.3 mV
Lead Channel Setting Pacing Amplitude: 2 V
Lead Channel Setting Pacing Amplitude: 2.25 V
Lead Channel Setting Pacing Amplitude: 2.5 V
Lead Channel Setting Pacing Pulse Width: 0.5 ms
Lead Channel Setting Pacing Pulse Width: 0.5 ms
Lead Channel Setting Sensing Sensitivity: 0.5 mV
Pulse Gen Serial Number: 9879509

## 2019-04-28 ENCOUNTER — Ambulatory Visit (INDEPENDENT_AMBULATORY_CARE_PROVIDER_SITE_OTHER): Payer: Medicare Other

## 2019-04-28 DIAGNOSIS — I5022 Chronic systolic (congestive) heart failure: Secondary | ICD-10-CM | POA: Diagnosis not present

## 2019-04-28 DIAGNOSIS — Z95810 Presence of automatic (implantable) cardiac defibrillator: Secondary | ICD-10-CM

## 2019-04-28 NOTE — Progress Notes (Signed)
ICD Remote  

## 2019-04-29 ENCOUNTER — Telehealth: Payer: Self-pay

## 2019-04-29 NOTE — Progress Notes (Signed)
EPIC Encounter for ICM Monitoring  Patient Name: Donna Hopkins is a 64 y.o. female Date: 04/29/2019 Primary Care Physican: Dennard Schaumann, MD Primary Cardiologist:Rosario Electrophysiologist:Allred Bi-V Pacing:98% Weight:230 lbs  Attempted call to patient and unable to reach.  Left detailed message per DPR regarding transmission. Transmission reviewed.   CorvueThoracic impedance suggesting possible fluid accumulation since 04/25/2019.   Prescribed:   Furosemide20 mgTake 40 mg by mouth daily.  Potassium 20 mEq take 1 tablet daily.  Labs: 04/17/2018 Creatinine 0.74, BUN 10, Potassium 4.2, Sodium 132, GFR >60  04/14/2018 Creatinine 0.76, BUN 9,   Potassium 4.0, Sodium 135, GFR 83-96  A complete set of results can be found in Results Review.  Recommendations: Left voice mail with ICM number and encouraged to call if experiencing any fluid symptoms.  Follow-up plan: ICM clinic phone appointment on 05/07/2019 to recheck fluid levels.   91 day device clinic remote transmission 07/27/2019.      Copy of ICM check sent to Dr. Johney Frame.   3 month ICM trend: 04/27/2019    1 Year ICM trend:       Karie Soda, RN 04/29/2019 1:13 PM

## 2019-04-29 NOTE — Telephone Encounter (Signed)
Remote ICM transmission received.  Attempted call to patient regarding ICM remote transmission and left detailed message per DPR.  Advised to return call for any fluid symptoms or questions.  

## 2019-05-07 ENCOUNTER — Ambulatory Visit (INDEPENDENT_AMBULATORY_CARE_PROVIDER_SITE_OTHER): Payer: Medicare Other

## 2019-05-07 DIAGNOSIS — Z95810 Presence of automatic (implantable) cardiac defibrillator: Secondary | ICD-10-CM

## 2019-05-07 DIAGNOSIS — I5022 Chronic systolic (congestive) heart failure: Secondary | ICD-10-CM

## 2019-05-08 NOTE — Progress Notes (Signed)
EPIC Encounter for ICM Monitoring  Patient Name: Brandee Markin is a 64 y.o. female Date: 05/08/2019 Primary Care Physican: Dennard Schaumann, MD Primary Cardiologist:Rosario Electrophysiologist:Allred Bi-V Pacing:98% Weight:230 lbs  Transmission reviewed.   CorvueThoracic impedance returned to normal since 2/16 remote transmission.   Prescribed:   Furosemide20 mgTake 40 mg by mouth daily.  Potassium 20 mEq take 1 tablet daily.  Labs: 04/17/2018 Creatinine 0.74, BUN 10, Potassium 4.2, Sodium 132, GFR >60  04/14/2018 Creatinine 0.76, BUN 9,   Potassium 4.0, Sodium 135, GFR 83-96  A complete set of results can be found in Results Review.  Recommendations:None  Follow-up plan: ICM clinic phone appointment on3/22/2021. 91 day device clinic remote transmission 07/27/2019.   Copy of ICM check sent to Dr.Allred.   3 month ICM trend: 05/07/2019    1 Year ICM trend:       Karie Soda, RN 05/08/2019 4:52 PM

## 2019-05-28 ENCOUNTER — Telehealth: Payer: Self-pay
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
## 2019-06-01 ENCOUNTER — Ambulatory Visit (INDEPENDENT_AMBULATORY_CARE_PROVIDER_SITE_OTHER): Payer: Medicare Other

## 2019-06-01 DIAGNOSIS — Z95810 Presence of automatic (implantable) cardiac defibrillator: Secondary | ICD-10-CM

## 2019-06-01 DIAGNOSIS — I5022 Chronic systolic (congestive) heart failure: Secondary | ICD-10-CM

## 2019-06-03 NOTE — Progress Notes (Addendum)
EPIC Encounter for ICM Monitoring  Patient Name: Donna Hopkins is a 64 y.o. female Date: 06/03/2019 Primary Care Physican: Dennard Schaumann, MD Primary Cardiologist:Rosario Electrophysiologist:Allred Bi-V Pacing:98% 06/03/2019 Weight:240 lbs  Spoke with patient and reports feeling well at this time.  Denies fluid symptoms at this time.  She reports drinking a lot of fluids during decreased impedance and also missed a couple of Furosemide dosages.   CorvueThoracic impedance normal.   Prescribed:   Furosemide20 mgTake 40 mg by mouth daily.  Potassium 20 mEq take 1 tablet daily.  Labs: 04/17/2018 Creatinine0.74, BUN10, Potassium4.2, Sodium132, GFR>60  04/14/2018 Creatinine0.76, BUN9, Potassium 4.0, Sodium135, [id number] A complete set of results can be found in Results Review.  Recommendations: Recommendation to limit salt intake to 2000 mg daily and fluid intake to 64 oz daily.  Encouraged to call if experiencing any fluid symptoms.   Follow-up plan: ICM clinic phone appointment on4/26/2021. 91 day device clinic remote transmission5/17/2021.   Copy of ICM check sent to Dr.Allred  3 month ICM trend: 05/28/2019    1 Year ICM trend:       Karie Soda, RN 06/03/2019 3:41 PM

## 2019-07-06 ENCOUNTER — Ambulatory Visit (INDEPENDENT_AMBULATORY_CARE_PROVIDER_SITE_OTHER): Payer: Medicare Other

## 2019-07-06 DIAGNOSIS — Z95810 Presence of automatic (implantable) cardiac defibrillator: Secondary | ICD-10-CM

## 2019-07-06 DIAGNOSIS — I5022 Chronic systolic (congestive) heart failure: Secondary | ICD-10-CM

## 2019-07-08 NOTE — Progress Notes (Signed)
EPIC Encounter for ICM Monitoring  Patient Name: Donna Hopkins is a 64 y.o. female Date: 07/08/2019 Primary Care Physican: Dennard Schaumann, MD Primary Cardiologist:Rosario Electrophysiologist:Allred Bi-V Pacing:98% 06/03/2019 Weight:240 lbs  AT/AF Burden: <1% (takes Warfarin)  Transmission reviewed.  CorvueThoracic impedance normal.  Prescribed:   Furosemide20 mgTake 40 mg by mouth daily.  Potassium 20 mEq take 1 tablet daily.  Labs: 04/17/2018 Creatinine0.74, BUN10, Potassium4.2, Sodium132, GFR>60  04/14/2018 Creatinine0.76, BUN9, Potassium 4.0, Sodium135, [id number] A complete set of results can be found in Results Review.  Recommendations: None  Follow-up plan: ICM clinic phone appointment on6/03/2019. 91 day device clinic remote transmission5/17/2021.   Copy of ICM check sent to Dr.Allred  3 month ICM trend: 07/06/2019    1 Year ICM trend:       Karie Soda, RN 07/08/2019 9:12 AM

## 2019-07-27 ENCOUNTER — Ambulatory Visit (INDEPENDENT_AMBULATORY_CARE_PROVIDER_SITE_OTHER): Payer: Medicare Other | Admitting: *Deleted

## 2019-07-27 DIAGNOSIS — I428 Other cardiomyopathies: Secondary | ICD-10-CM

## 2019-07-27 LAB — CUP PACEART REMOTE DEVICE CHECK
Battery Remaining Longevity: 68 mo
Battery Remaining Percentage: 82 %
Battery Voltage: 2.99 V
Brady Statistic AP VP Percent: 1 %
Brady Statistic AP VS Percent: 1 %
Brady Statistic AS VP Percent: 98 %
Brady Statistic AS VS Percent: 1 %
Brady Statistic RA Percent Paced: 1 %
Date Time Interrogation Session: 20210517020022
HighPow Impedance: 71 Ohm
HighPow Impedance: 71 Ohm
Implantable Lead Implant Date: 20140429
Implantable Lead Implant Date: 20140617
Implantable Lead Implant Date: 20200205
Implantable Lead Location: 753858
Implantable Lead Location: 753859
Implantable Lead Location: 753860
Implantable Pulse Generator Implant Date: 20200205
Lead Channel Impedance Value: 450 Ohm
Lead Channel Impedance Value: 490 Ohm
Lead Channel Impedance Value: 650 Ohm
Lead Channel Pacing Threshold Amplitude: 0.75 V
Lead Channel Pacing Threshold Amplitude: 1 V
Lead Channel Pacing Threshold Amplitude: 1.625 V
Lead Channel Pacing Threshold Pulse Width: 0.5 ms
Lead Channel Pacing Threshold Pulse Width: 0.5 ms
Lead Channel Pacing Threshold Pulse Width: 0.5 ms
Lead Channel Sensing Intrinsic Amplitude: 11 mV
Lead Channel Sensing Intrinsic Amplitude: 3.5 mV
Lead Channel Setting Pacing Amplitude: 2 V
Lead Channel Setting Pacing Amplitude: 2.5 V
Lead Channel Setting Pacing Amplitude: 2.625
Lead Channel Setting Pacing Pulse Width: 0.5 ms
Lead Channel Setting Pacing Pulse Width: 0.5 ms
Lead Channel Setting Sensing Sensitivity: 0.5 mV
Pulse Gen Serial Number: 9879509

## 2019-07-28 NOTE — Progress Notes (Signed)
Remote ICD transmission.  ° °

## 2019-08-11 ENCOUNTER — Ambulatory Visit (INDEPENDENT_AMBULATORY_CARE_PROVIDER_SITE_OTHER): Payer: Medicare Other

## 2019-08-11 DIAGNOSIS — Z95810 Presence of automatic (implantable) cardiac defibrillator: Secondary | ICD-10-CM | POA: Diagnosis not present

## 2019-08-11 DIAGNOSIS — I5022 Chronic systolic (congestive) heart failure: Secondary | ICD-10-CM

## 2019-08-12 ENCOUNTER — Telehealth: Payer: Self-pay

## 2019-08-12 NOTE — Telephone Encounter (Signed)
Remote ICM transmission received.  Attempted call to patient regarding ICM remote transmission and left detailed message per DPR.  Advised to return call for any fluid symptoms or questions. Next ICM remote transmission scheduled 09/15/2019.     

## 2019-08-12 NOTE — Progress Notes (Signed)
EPIC Encounter for ICM Monitoring  Patient Name: Donna Hopkins is a 64 y.o. female Date: 08/12/2019 Primary Care Physican: Dennard Schaumann, MD Primary Cardiologist:Rosario Electrophysiologist:Allred Bi-V Pacing:98% 3/24/2021Weight:240 lbs  AT/AF Burden: <1% (takes Warfarin)  Attempted call to patient and unable to reach.  Left detailed message per DPR regarding transmission. Transmission reviewed.   CorvueThoracic impedance normal on 6/1 but does suggest possible fluid accumulation from 5/10 - 5/18 for several days as well as over the [facility].  Prescribed:   Furosemide20 mgTake 40 mg by mouth daily.  Potassium 20 mEq take 1 tablet daily.  Labs: 04/17/2018 Creatinine0.74, BUN10, Potassium4.2, Sodium132, GFR>60  04/14/2018 Creatinine0.76, BUN9, Potassium 4.0, Sodium135, [id number] A complete set of results can be found in Results Review.  Recommendations: Left voice mail with ICM number and encouraged to call if experiencing any fluid symptoms.  Follow-up plan: ICM clinic phone appointment on7/08/2019. 91 day device clinic remote transmission8/16/2021.   Copy of ICM check sent to Dr.Allred  3 month ICM trend: 08/11/2019    1 Year ICM trend:       Karie Soda, RN 08/12/2019 1:43 PM

## 2019-09-28 NOTE — Progress Notes (Unsigned)
No ICM remote transmission received for 09/15/2019 and next ICM transmission scheduled for 10/19/2019.   

## 2019-10-20 ENCOUNTER — Telehealth: Payer: Self-pay

## 2019-10-20 NOTE — Telephone Encounter (Signed)
LMOVM for pt to send missed transmission for 10-19-2019.

## 2019-10-23 NOTE — Progress Notes (Signed)
No ICM remote transmission received for 10/21/2019 and next ICM transmission scheduled for 10/27/2019.

## 2019-10-26 ENCOUNTER — Ambulatory Visit (INDEPENDENT_AMBULATORY_CARE_PROVIDER_SITE_OTHER): Payer: Medicare Other | Admitting: *Deleted

## 2019-10-26 DIAGNOSIS — I428 Other cardiomyopathies: Secondary | ICD-10-CM | POA: Diagnosis not present

## 2019-10-26 DIAGNOSIS — I5022 Chronic systolic (congestive) heart failure: Secondary | ICD-10-CM

## 2019-11-02 LAB — CUP PACEART REMOTE DEVICE CHECK
Battery Remaining Longevity: 60 mo
Battery Remaining Percentage: 78 %
Battery Voltage: 2.98 V
Brady Statistic AP VP Percent: 1 %
Brady Statistic AP VS Percent: 1 %
Brady Statistic AS VP Percent: 98 %
Brady Statistic AS VS Percent: 1 %
Brady Statistic RA Percent Paced: 1 %
Date Time Interrogation Session: 20210816025827
HighPow Impedance: 71 Ohm
HighPow Impedance: 71 Ohm
Implantable Lead Implant Date: 20140429
Implantable Lead Implant Date: 20140617
Implantable Lead Implant Date: 20200205
Implantable Lead Location: 753858
Implantable Lead Location: 753859
Implantable Lead Location: 753860
Implantable Pulse Generator Implant Date: 20200205
Lead Channel Impedance Value: 460 Ohm
Lead Channel Impedance Value: 540 Ohm
Lead Channel Impedance Value: 660 Ohm
Lead Channel Pacing Threshold Amplitude: 0.75 V
Lead Channel Pacing Threshold Amplitude: 1 V
Lead Channel Pacing Threshold Amplitude: 1.75 V
Lead Channel Pacing Threshold Pulse Width: 0.5 ms
Lead Channel Pacing Threshold Pulse Width: 0.5 ms
Lead Channel Pacing Threshold Pulse Width: 0.5 ms
Lead Channel Sensing Intrinsic Amplitude: 12 mV
Lead Channel Sensing Intrinsic Amplitude: 3.8 mV
Lead Channel Setting Pacing Amplitude: 2 V
Lead Channel Setting Pacing Amplitude: 2.5 V
Lead Channel Setting Pacing Amplitude: 2.75 V
Lead Channel Setting Pacing Pulse Width: 0.5 ms
Lead Channel Setting Pacing Pulse Width: 0.5 ms
Lead Channel Setting Sensing Sensitivity: 0.5 mV
Pulse Gen Serial Number: 9879509

## 2019-11-04 NOTE — Progress Notes (Signed)
Remote ICD transmission.   

## 2019-11-11 ENCOUNTER — Ambulatory Visit (INDEPENDENT_AMBULATORY_CARE_PROVIDER_SITE_OTHER): Payer: Medicare Other

## 2019-11-11 DIAGNOSIS — Z95810 Presence of automatic (implantable) cardiac defibrillator: Secondary | ICD-10-CM

## 2019-11-11 DIAGNOSIS — I5022 Chronic systolic (congestive) heart failure: Secondary | ICD-10-CM

## 2019-11-11 NOTE — Progress Notes (Signed)
EPIC Encounter for ICM Monitoring  Patient Name: Donna Hopkins is a 64 y.o. female Date: 11/11/2019 Primary Care Physican: Dennard Schaumann, MD Primary Cardiologist:Rosario Electrophysiologist:Allred Bi-V Pacing:99% LastWeight:240 lbs  AT/AF Burden: <1% (takes Warfarin)  Spoke with patient and she reports swelling of feet.  She does not always take Furosemide as prescribed.  She does to admit to eating 1 bag of ice from the store within 2 days.  CorvueThoracic impedance suggesting possible fluid accumulation starting 11/09/2019.  Prescribed:   Furosemide20 mgTake 2 tablets (40 mg total) by mouth daily.  Potassium 20 mEq take 1 tablet daily.  Labs: 04/17/2018 Creatinine0.74, BUN10, Potassium4.2, Sodium132, GFR>60  04/14/2018 Creatinine0.76, BUN9, Potassium 4.0, Sodium135, [id number] A complete set of results can be found in Results Review.  Recommendations:  Advised to take 3 tablets of Furosemide daily x 2 days only with Potassium 2 tablets x 2 days only.  After 2nd day return to prescribed dosages of Lasix and Potassium.  Advised to limit fluid intake including ice to 64 oz a day.  Follow-up plan: ICM clinic phone appointment on 11/19/2019 to recheck fluid levels.   91 day device clinic remote transmission 01/25/2020.    EP/Cardiology Office Visits: Recall for 07/31/2019 with Gypsy Balsam, NP.    Copy of ICM check sent to Dr. Johney Frame.   3 month ICM trend: 11/11/2019    1 Year ICM trend:       Karie Soda, RN 11/11/2019 2:07 PM

## 2019-11-19 ENCOUNTER — Ambulatory Visit (INDEPENDENT_AMBULATORY_CARE_PROVIDER_SITE_OTHER): Payer: Medicare Other

## 2019-11-19 DIAGNOSIS — Z95810 Presence of automatic (implantable) cardiac defibrillator: Secondary | ICD-10-CM

## 2019-11-19 DIAGNOSIS — I5022 Chronic systolic (congestive) heart failure: Secondary | ICD-10-CM

## 2019-11-20 ENCOUNTER — Telehealth: Payer: Self-pay

## 2019-11-20 NOTE — Progress Notes (Signed)
EPIC Encounter for ICM Monitoring  Patient Name: Donna Hopkins is a 64 y.o. female Date: 11/20/2019 Primary Care Physican: Dennard Schaumann, MD Primary Cardiologist:Rosario Electrophysiologist:Allred Bi-V Pacing:99% LastWeight:240 lbs  AT/AF Burden: <1% (takes Warfarin)  Attempted call to patient and unable to reach.  Left detailed message per DPR regarding transmission. Transmission reviewed.   CorvueThoracic impedance suggesting fluid levels returned to normal after taking extra Furosemide.  Prescribed:   Furosemide20 mgTake 2 tablets (40 mg total) by mouth daily.  Potassium 20 mEq take 1 tablet daily.  Labs: 04/17/2018 Creatinine0.74, BUN10, Potassium4.2, Sodium132, GFR>60  04/14/2018 Creatinine0.76, BUN9, Potassium 4.0, Sodium135, [id number] A complete set of results can be found in Results Review.  Recommendations:  Left voice mail with ICM number and encouraged to call if experiencing any fluid symptoms.  Follow-up plan: ICM clinic phone appointment on 12/14/2019.   91 day device clinic remote transmission 01/25/2020.    EP/Cardiology Office Visits: Recall for 07/31/2019 with Gypsy Balsam, NP.    Copy of ICM check sent to Dr. Johney Frame.    3 month ICM trend: 11/19/2019    1 Year ICM trend:       Karie Soda, RN 11/20/2019 2:47 PM

## 2019-11-20 NOTE — Telephone Encounter (Signed)
Remote ICM transmission received.  Attempted call to patient regarding ICM remote transmission and left detailed message per DPR.  Advised to return call for any fluid symptoms or questions.  

## 2019-12-14 ENCOUNTER — Ambulatory Visit (INDEPENDENT_AMBULATORY_CARE_PROVIDER_SITE_OTHER): Payer: Medicare Other

## 2019-12-14 ENCOUNTER — Telehealth: Payer: Self-pay

## 2019-12-14 DIAGNOSIS — I5022 Chronic systolic (congestive) heart failure: Secondary | ICD-10-CM | POA: Diagnosis not present

## 2019-12-14 DIAGNOSIS — Z95810 Presence of automatic (implantable) cardiac defibrillator: Secondary | ICD-10-CM | POA: Diagnosis not present

## 2019-12-14 NOTE — Progress Notes (Signed)
EPIC Encounter for ICM Monitoring  Patient Name: Donna Hopkins is a 64 y.o. female Date: 12/14/2019 Primary Care Physican: Dennard Schaumann, MD Primary Cardiologist:Rosario Electrophysiologist:Allred Bi-V Pacing:99% LastWeight:240 lbs  AT/AF Burden: <1% (takes Warfarin)  Attempted call to patient and unable to reach.  Left detailed message per DPR regarding transmission. Transmission reviewed.   CorvueThoracic impedancesuggesting possible fluid accumulation starting 12/07/2019  Prescribed:   Furosemide20 mgTake2 tablets (40 mgtotal)by mouth daily.  Potassium 20 mEq take 1 tablet daily.  Labs: 04/17/2018 Creatinine0.74, BUN10, Potassium4.2, Sodium132, GFR>60  04/14/2018 Creatinine0.76, BUN9, Potassium 4.0, Sodium135, [id number] A complete set of results can be found in Results Review.  Recommendations:Left voice mail with ICM number and encouraged to call if experiencing any fluid symptoms.  Will advise to take extra Furosemide and Potassium per protocol if patient is reached.  Follow-up plan: ICM clinic phone appointment on10/02/2020 to recheck fluid levels. 91 day device clinic remote transmission 01/25/2020.   EP/Cardiology Office Visits: Pt needs to schedule EP visit. Recall for 07/31/2019 Blake Divine, NP.   Copy of ICM check sent to Dr.Allred.     3 month ICM trend: 12/14/2019    1 Year ICM trend:       Karie Soda, RN 12/14/2019 1:31 PM

## 2019-12-14 NOTE — Telephone Encounter (Signed)
Remote ICM transmission received.  Attempted call to patient regarding ICM remote transmission and left detailed message per DPR.  Advised to return call for any fluid symptoms or questions.  

## 2019-12-22 ENCOUNTER — Ambulatory Visit (INDEPENDENT_AMBULATORY_CARE_PROVIDER_SITE_OTHER): Payer: Medicare Other

## 2019-12-22 ENCOUNTER — Telehealth: Payer: Self-pay

## 2019-12-22 DIAGNOSIS — I5022 Chronic systolic (congestive) heart failure: Secondary | ICD-10-CM

## 2019-12-22 DIAGNOSIS — Z95810 Presence of automatic (implantable) cardiac defibrillator: Secondary | ICD-10-CM

## 2019-12-22 NOTE — Telephone Encounter (Signed)
Remote ICM transmission received.  Attempted call to patient regarding ICM remote transmission and left detailed message per DPR.  Advised to return call for any fluid symptoms or questions.  

## 2019-12-22 NOTE — Progress Notes (Signed)
EPIC Encounter for ICM Monitoring  Patient Name: Donna Hopkins is a 64 y.o. female Date: 12/22/2019 Primary Care Physican: Dennard Schaumann, MD Primary Cardiologist:Rosario Electrophysiologist:Allred Bi-V Pacing:99% LastWeight:240 lbs  AT/AF Burden: <1% (takes Warfarin)  Attempted call to patient and unable to reach. Left detailed message per DPR regarding transmission. Transmission reviewed.  CorvueThoracic impedancesuggestingpossible fluid accumulation starting 12/21/2019  Prescribed:   Furosemide20 mgTake2 tablets (40 mgtotal)by mouth daily.  Potassium 20 mEq take 1 tablet daily.  Labs: 04/17/2018 Creatinine0.74, BUN10, Potassium4.2, Sodium132, GFR>60  04/14/2018 Creatinine0.76, BUN9, Potassium 4.0, Sodium135, [id number] A complete set of results can be found in Results Review.  Recommendations:Left voice mail with ICM number and encouraged to call if experiencing any fluid symptoms.  Will advise to take extra Furosemide and Potassium per protocol if patient is reached.  Follow-up plan: ICM clinic phone appointment on11/03/2019 to recheck fluid levels. 91 day device clinic remote transmission 01/25/2020.   EP/Cardiology Office Visits: Pt needs to schedule EP visit. Recall for 07/31/2019 Blake Divine, NP.   Copy of ICM check sent to Dr.Allred and Dr Tresa Endo for [facility].   3 month ICM trend: 12/22/2019    1 Year ICM trend:       Karie Soda, RN 12/22/2019 2:50 PM

## 2020-01-11 ENCOUNTER — Ambulatory Visit (INDEPENDENT_AMBULATORY_CARE_PROVIDER_SITE_OTHER): Payer: Medicare Other

## 2020-01-11 DIAGNOSIS — I5022 Chronic systolic (congestive) heart failure: Secondary | ICD-10-CM | POA: Diagnosis not present

## 2020-01-11 DIAGNOSIS — Z95810 Presence of automatic (implantable) cardiac defibrillator: Secondary | ICD-10-CM

## 2020-01-15 NOTE — Progress Notes (Signed)
EPIC Encounter for ICM Monitoring  Patient Name: Donna Hopkins is a 64 y.o. female Date: 01/15/2020 Primary Care Physican: Dennard Schaumann, MD Primary Cardiologist:Rosario Electrophysiologist:Allred Bi-V Pacing:99% LastWeight:240 lbs  AT/AF Burden: <1% (takes Warfarin)  Transmission reviewed.  CorvueThoracic impedancesuggestingpossible fluid accumulation starting 12/21/2019  Prescribed:   Furosemide20 mgTake2 tablets (40 mgtotal)by mouth daily.  Potassium 20 mEq take 1 tablet daily.  Labs: 04/17/2018 Creatinine0.74, BUN10, Potassium4.2, Sodium132, GFR>60  04/14/2018 Creatinine0.76, BUN9, Potassium 4.0, Sodium135, [id number] A complete set of results can be found in Results Review.  Recommendations:No changes.  Follow-up plan: ICM clinic phone appointment on12/09/2019. 91 day device clinic remote transmission 01/25/2020.   EP/Cardiology Office Visits: Pt needs to schedule EP visit.Recall for 07/31/2019 Blake Divine, NP.   Copy of ICM check sent to Dr.Allred.   3 month ICM trend: 01/15/2020    1 Year ICM trend:       Karie Soda, RN 01/15/2020 4:29 PM

## 2020-01-25 LAB — CUP PACEART REMOTE DEVICE CHECK
Battery Remaining Longevity: 58 mo
Battery Remaining Percentage: 75 %
Battery Voltage: 2.98 V
Brady Statistic AP VP Percent: 1 %
Brady Statistic AP VS Percent: 1 %
Brady Statistic AS VP Percent: 98 %
Brady Statistic AS VS Percent: 1 %
Brady Statistic RA Percent Paced: 1 %
Date Time Interrogation Session: 20211115020025
HighPow Impedance: 81 Ohm
HighPow Impedance: 81 Ohm
Implantable Lead Implant Date: 20140429
Implantable Lead Implant Date: 20140617
Implantable Lead Implant Date: 20200205
Implantable Lead Location: 753858
Implantable Lead Location: 753859
Implantable Lead Location: 753860
Implantable Pulse Generator Implant Date: 20200205
Lead Channel Impedance Value: 490 Ohm
Lead Channel Impedance Value: 550 Ohm
Lead Channel Impedance Value: 700 Ohm
Lead Channel Pacing Threshold Amplitude: 0.75 V
Lead Channel Pacing Threshold Amplitude: 0.875 V
Lead Channel Pacing Threshold Amplitude: 2 V
Lead Channel Pacing Threshold Pulse Width: 0.5 ms
Lead Channel Pacing Threshold Pulse Width: 0.5 ms
Lead Channel Pacing Threshold Pulse Width: 0.5 ms
Lead Channel Sensing Intrinsic Amplitude: 12 mV
Lead Channel Sensing Intrinsic Amplitude: 5 mV
Lead Channel Setting Pacing Amplitude: 2 V
Lead Channel Setting Pacing Amplitude: 2.5 V
Lead Channel Setting Pacing Amplitude: 3 V
Lead Channel Setting Pacing Pulse Width: 0.5 ms
Lead Channel Setting Pacing Pulse Width: 0.5 ms
Lead Channel Setting Sensing Sensitivity: 0.5 mV
Pulse Gen Serial Number: 9879509

## 2020-02-16 ENCOUNTER — Ambulatory Visit (INDEPENDENT_AMBULATORY_CARE_PROVIDER_SITE_OTHER): Payer: Medicare Other

## 2020-02-16 DIAGNOSIS — I5022 Chronic systolic (congestive) heart failure: Secondary | ICD-10-CM | POA: Diagnosis not present

## 2020-02-16 DIAGNOSIS — Z95810 Presence of automatic (implantable) cardiac defibrillator: Secondary | ICD-10-CM | POA: Diagnosis not present

## 2020-02-19 ENCOUNTER — Telehealth: Payer: Self-pay | Admitting: Licensed Clinical Social Worker

## 2020-02-19 NOTE — Progress Notes (Signed)
EPIC Encounter for ICM Monitoring  Patient Name: Donna Hopkins is a 64 y.o. female Date: 02/19/2020 Primary Care Physican: Dennard Schaumann, MD Primary Cardiologist:Rosario Electrophysiologist:Allred Bi-V Pacing:99% 12/10/2021Weight:243 lbs  AT/AF Burden: <1% (takes Warfarin)  Spoke with patient and reports feeling well at this time.  Denies fluid symptoms.   She sometimes forgets to take fluid pill and drinks to much fluid at times.  She is having difficulty paying the light bill and sent message to SW to call patient.  CorvueThoracic impedancesuggestingpossible fluid accumulation from 11/19 - 11/25 and 12/3 - 12/6.  Prescribed:   Furosemide20 mgTake2 tablets (40 mgtotal)by mouth daily.  Potassium 20 mEq take 1 tablet daily.  Labs: 04/17/2018 Creatinine0.74, BUN10, Potassium4.2, Sodium132, GFR>60  04/14/2018 Creatinine0.76, BUN9, Potassium 4.0, Sodium135, [id number] A complete set of results can be found in Results Review.  Recommendations: Advised to take medications daily as prescribed.  No changes and encouraged to call if experiencing any fluid symptoms.    Follow-up plan: ICM clinic phone appointment on1/12/2020. 91 day device clinic remote transmission 04/25/2020.   EP/Cardiology Office Visits: Pt needs to schedule EP visit.Recall for 07/31/2019 Donna Divine, NP. Message sent to EP scheduler to call patient to make yearly appointment.   Copy of ICM check sent to Dr.Allred.    3 month ICM trend: 02/16/2020    1 Year ICM trend:       Donna Soda, RN 02/19/2020 11:00 AM
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
## 2020-02-25 ENCOUNTER — Telehealth: Payer: Self-pay | Admitting: Licensed Clinical Social Worker

## 2020-02-25 NOTE — Telephone Encounter (Signed)
CSW received call form patient stating that she still does not have hot water and unsure about payment on her utility bill. Patient went on to share that she was told by the new management that she will have to move out in 30 days as they no longer accept section 8 housing. CSW contacted landlord to discuss concerns and was informed that yes patient will have to seek another housing option although the date has not yet been determined. The landlord will contact patient and CSW to inform of date when determined. CSW also contacted [employer] and confirmed that patient's bill has been paid in full by [facility]. CSW returned call to patient to inform of conversations with landlord and city utilities. Patient grateful for the assistance. CSW provided patient with a list of senior housing options and will continue to provide support around housing concerns. Lasandra Beech, LCSW, CCSW-MCS [phone]

## 2020-03-02 ENCOUNTER — Telehealth: Payer: Self-pay | Admitting: Licensed Clinical Social Worker

## 2020-03-02 NOTE — Telephone Encounter (Signed)
CSW left message for patient to follow up on housing issue. Message left for return call. Lasandra Beech, LCSW, CCSW-MCS [phone]

## 2020-03-07 ENCOUNTER — Encounter: Payer: Self-pay | Admitting: Internal Medicine

## 2020-03-21 ENCOUNTER — Ambulatory Visit (INDEPENDENT_AMBULATORY_CARE_PROVIDER_SITE_OTHER): Payer: Medicare Other

## 2020-03-21 DIAGNOSIS — I5022 Chronic systolic (congestive) heart failure: Secondary | ICD-10-CM | POA: Diagnosis not present

## 2020-03-21 DIAGNOSIS — Z95810 Presence of automatic (implantable) cardiac defibrillator: Secondary | ICD-10-CM | POA: Diagnosis not present

## 2020-03-22 ENCOUNTER — Telehealth: Payer: Self-pay

## 2020-03-22 NOTE — Progress Notes (Signed)
EPIC Encounter for ICM Monitoring  Patient Name: Donna Hopkins is a 65 y.o. female Date: 03/22/2020 Primary Care Physican: Dennard Schaumann, MD Primary Cardiologist:Rosario Electrophysiologist:Allred Bi-V Pacing:99% 12/10/2021Weight:243 lbs  AT/AF Burden: <1% (takes Warfarin)  Attempted call to patient and unable to reach.   Transmission reviewed.   CorvueThoracic impedancenormal but was suggestingpossible fluid accumulation from 03/08/20-03/15/20 and 03/18/20-03/20/20.  Prescribed:   Furosemide20 mgTake2 tablets (40 mgtotal)by mouth daily.  Potassium 20 mEq take 1 tablet daily.  Labs: 04/17/2018 Creatinine0.74, BUN10, Potassium4.2, Sodium132, GFR>60  04/14/2018 Creatinine0.76, BUN9, Potassium 4.0, Sodium135, [id number] A complete set of results can be found in Results Review.  Recommendations: Unable to reach.    Follow-up plan: ICM clinic phone appointment on2/15/2022. 91 day device clinic remote transmission 04/25/2020.   EP/Cardiology Office Visits: Pt is aware she needs to schedule EP visit.Recall for 07/31/2019 Blake Divine, NP.   Copy of ICM check sent to Dr.Allred.   3 month ICM trend: 03/21/2020.    1 Year ICM trend:       Karie Soda, RN 03/22/2020 9:56 AM

## 2020-03-22 NOTE — Telephone Encounter (Signed)
Remote ICM transmission received.  Attempted call to patient regarding ICM remote transmission and mail box is full. 

## 2020-04-25 ENCOUNTER — Ambulatory Visit (INDEPENDENT_AMBULATORY_CARE_PROVIDER_SITE_OTHER): Payer: Medicare Other

## 2020-04-25 DIAGNOSIS — I428 Other cardiomyopathies: Secondary | ICD-10-CM

## 2020-04-25 LAB — CUP PACEART REMOTE DEVICE CHECK
Battery Remaining Longevity: 55 mo
Battery Remaining Percentage: 71 %
Battery Voltage: 2.96 V
Brady Statistic AP VP Percent: 1 %
Brady Statistic AP VS Percent: 1 %
Brady Statistic AS VP Percent: 98 %
Brady Statistic AS VS Percent: 1 %
Brady Statistic RA Percent Paced: 1 %
Date Time Interrogation Session: 20220214020019
HighPow Impedance: 83 Ohm
HighPow Impedance: 83 Ohm
Implantable Lead Implant Date: 20140429
Implantable Lead Implant Date: 20140617
Implantable Lead Implant Date: 20200205
Implantable Lead Location: 753858
Implantable Lead Location: 753859
Implantable Lead Location: 753860
Implantable Pulse Generator Implant Date: 20200205
Lead Channel Impedance Value: 490 Ohm
Lead Channel Impedance Value: 590 Ohm
Lead Channel Impedance Value: 740 Ohm
Lead Channel Pacing Threshold Amplitude: 0.75 V
Lead Channel Pacing Threshold Amplitude: 0.75 V
Lead Channel Pacing Threshold Amplitude: 2.125 V
Lead Channel Pacing Threshold Pulse Width: 0.5 ms
Lead Channel Pacing Threshold Pulse Width: 0.5 ms
Lead Channel Pacing Threshold Pulse Width: 0.5 ms
Lead Channel Sensing Intrinsic Amplitude: 12 mV
Lead Channel Sensing Intrinsic Amplitude: 4.5 mV
Lead Channel Setting Pacing Amplitude: 2 V
Lead Channel Setting Pacing Amplitude: 2.5 V
Lead Channel Setting Pacing Amplitude: 3.125
Lead Channel Setting Pacing Pulse Width: 0.5 ms
Lead Channel Setting Pacing Pulse Width: 0.5 ms
Lead Channel Setting Sensing Sensitivity: 0.5 mV
Pulse Gen Serial Number: 9879509

## 2020-04-26 ENCOUNTER — Ambulatory Visit (INDEPENDENT_AMBULATORY_CARE_PROVIDER_SITE_OTHER): Payer: Medicare Other

## 2020-04-26 DIAGNOSIS — Z95810 Presence of automatic (implantable) cardiac defibrillator: Secondary | ICD-10-CM

## 2020-04-26 DIAGNOSIS — I5022 Chronic systolic (congestive) heart failure: Secondary | ICD-10-CM | POA: Diagnosis not present

## 2020-05-02 NOTE — Progress Notes (Signed)
Remote ICD transmission.   

## 2020-05-03 NOTE — Progress Notes (Signed)
EPIC Encounter for ICM Monitoring  Patient Name: Donna Hopkins is a 65 y.o. female Date: 05/03/2020 Primary Care Physican: Dennard Schaumann, MD Primary Cardiologist:Rosario Electrophysiologist:Allred Bi-V Pacing:99% 12/10/2021Weight:243lbs  AT/AF Burden: <1% (takes Warfarin)  Transmission reviewed.   CorvueThoracic impedancesuggesting normal fluid levels per direct trend view through 04/29/2020.  Prescribed:   Furosemide20 mgTake2 tablets (40 mgtotal)by mouth daily.  Potassium 20 mEq take 1 tablet daily.  Labs: 04/17/2018 Creatinine0.74, BUN10, Potassium4.2, Sodium132, GFR>60  04/14/2018 Creatinine0.76, BUN9, Potassium 4.0, Sodium135, [id number] A complete set of results can be found in Results Review.  Recommendations: No changes.  Follow-up plan: ICM clinic phone appointment on3/21/2022. 91 day device clinic remote transmission5/16/2022.   EP/Cardiology Office Visits: Pt is aware she needs to schedule EP visit.Recall for 07/31/2019 Blake Divine, NP.   Copy of ICM check sent to Dr.Allred.  Direct Trend View through 04/29/2020   3 month ICM trend: 04/26/2020.    1 Year ICM trend:       Karie Soda, RN 05/03/2020 4:23 PM

## 2020-05-20 ENCOUNTER — Ambulatory Visit (INDEPENDENT_AMBULATORY_CARE_PROVIDER_SITE_OTHER): Payer: Medicare Other | Admitting: Nurse Practitioner

## 2020-05-20 ENCOUNTER — Encounter: Payer: Self-pay | Admitting: Nurse Practitioner

## 2020-05-20 ENCOUNTER — Other Ambulatory Visit: Payer: Self-pay

## 2020-05-20 VITALS — BP 140/74 | HR 85 | Ht 70.0 in | Wt 238.4 lb

## 2020-05-20 DIAGNOSIS — I5022 Chronic systolic (congestive) heart failure: Secondary | ICD-10-CM | POA: Diagnosis not present

## 2020-05-20 DIAGNOSIS — I359 Nonrheumatic aortic valve disorder, unspecified: Secondary | ICD-10-CM | POA: Diagnosis not present

## 2020-05-20 LAB — CUP PACEART INCLINIC DEVICE CHECK
Battery Remaining Longevity: 50 mo
Brady Statistic RA Percent Paced: 0.07 %
Brady Statistic RV Percent Paced: 99 %
Date Time Interrogation Session: 20220311101803
HighPow Impedance: 69.75 Ohm
Implantable Lead Implant Date: 20140429
Implantable Lead Implant Date: 20140617
Implantable Lead Implant Date: 20200205
Implantable Lead Location: 753858
Implantable Lead Location: 753859
Implantable Lead Location: 753860
Implantable Pulse Generator Implant Date: 20200205
Lead Channel Impedance Value: 450 Ohm
Lead Channel Impedance Value: 512.5 Ohm
Lead Channel Impedance Value: 687.5 Ohm
Lead Channel Pacing Threshold Amplitude: 0.5 V
Lead Channel Pacing Threshold Amplitude: 0.75 V
Lead Channel Pacing Threshold Amplitude: 0.75 V
Lead Channel Pacing Threshold Amplitude: 2 V
Lead Channel Pacing Threshold Pulse Width: 0.5 ms
Lead Channel Pacing Threshold Pulse Width: 0.5 ms
Lead Channel Pacing Threshold Pulse Width: 0.5 ms
Lead Channel Pacing Threshold Pulse Width: 0.5 ms
Lead Channel Sensing Intrinsic Amplitude: 4.7 mV
Lead Channel Setting Pacing Amplitude: 2 V
Lead Channel Setting Pacing Amplitude: 2.5 V
Lead Channel Setting Pacing Amplitude: 2.875
Lead Channel Setting Pacing Pulse Width: 0.5 ms
Lead Channel Setting Pacing Pulse Width: 0.5 ms
Lead Channel Setting Sensing Sensitivity: 0.5 mV
Pulse Gen Serial Number: 9879509

## 2020-05-20 NOTE — Patient Instructions (Signed)
Medication Instructions:  Your physician recommends that you continue on your current medications as directed. Please refer to the Current Medication list given to you today.  *If you need a refill on your cardiac medications before your next appointment, please call your pharmacy*   Lab Work: None  If you have labs (blood work) drawn today and your tests are completely normal, you will receive your results only by: . MyChart Message (if you have MyChart) OR . A paper copy in the mail If you have any lab test that is abnormal or we need to change your treatment, we will call you to review the results.  Follow-Up: At [facility], you and your health needs are our priority.  As part of our continuing mission to provide you with exceptional heart care, we have created designated Provider Care Teams.  These Care Teams include your primary Cardiologist (physician) and Advanced Practice Providers (APPs -  Physician Assistants and Nurse Practitioners) who all work together to provide you with the care you need, when you need it.  Your next appointment:   1 year(s)  The format for your next appointment:   In Person  Provider:   You may see James Allred, MD or one of the following Advanced Practice Providers on your designated Care Team:    Amber Seiler, NP  Renee Ursuy, PA-C  Michael "Andy" Tillery, PA-C   

## 2020-05-20 NOTE — Progress Notes (Signed)
Electrophysiology Office Note Date: 05/20/2020  ID:  Donna Hopkins, DOB 1955/11/25, MRN [id number]  PCP: Donna Schaumann, MD Primary Cardiologist: Donna Hopkins Electrophysiologist: Donna Hopkins  CC: Routine ICD follow-up  Donna Hopkins is a 65 y.o. female seen today for Dr Donna Hopkins.  She presents today for routine electrophysiology followup.  Since last being seen in our clinic, the patient reports doing very well. She denies chest pain, palpitations, dyspnea, PND, orthopnea, nausea, vomiting, dizziness, syncope, edema, weight gain, or early satiety.  She has not had ICD shocks.     Past Medical History:  Diagnosis Date   Carotid stenosis, asymptomatic, bilateral    Chronic kidney disease, stage III (moderate) (HCC)    Chronic low back pain    Clinical systolic heart failure, chronic (HCC)    Depression    Diabetes mellitus    Eosinophilic gastritis    GERD (gastroesophageal reflux disease)    Gout, renal disease    H/O mechanical aortic valve replacement    on coumadin   Hx of aortic valve replacement    Hypertension    Hypocalcemia    Hyponatremia    ICD (implantable cardioverter-defibrillator) in place    Medication refill    Mixed hyperlipidemia    Neuropathy    Obesity    OP (osteoporosis)    Peripheral vascular disease, unspecified (HCC)    Persistent insomnia    Personal history of colonic polyps    Pulmonary hypertension (HCC)    Snoring    declines sleep study   Tricuspid regurgitation    Uncontrolled type 2 diabetes mellitus with complication (HCC)    Valgus foot    Vertigo, benign positional    Vitamin D deficiency    Past Surgical History:  Procedure Laterality Date   ABDOMINAL HYSTERECTOMY     BACK SURGERY     BI-VENTRICULAR IMPLANTABLE CARDIOVERTER DEFIBRILLATOR  (CRT-D)  07/08/2012   SJM Quadra Assura BiV ICD implanted by Dr Reed Pandy at [facility]   CARDIAC VALVE REPLACEMENT     LEAD INSERTION N/A 04/16/2018   Successful  BiV ICD system revision with a new RV lead and pulse generator replacement with a [facility] [facility] Quadra Assura MP model [id number] ICD    Current Outpatient Medications  Medication Sig Dispense Refill   acetaminophen (TYLENOL) 650 MG CR tablet Take 1,300 mg by mouth daily as needed for pain.     albuterol (PROVENTIL HFA;VENTOLIN HFA) 108 (90 BASE) MCG/ACT inhaler Inhale 2 puffs into the lungs every 6 (six) hours as needed for wheezing or shortness of breath.      allopurinol (ZYLOPRIM) 300 MG tablet Take 300 mg by mouth 3 (three) times daily.      ALPRAZolam (XANAX) 0.5 MG tablet Take 0.5 mg by mouth at bedtime as needed for anxiety or sleep.     atorvastatin (LIPITOR) 40 MG tablet Take 40 mg by mouth daily.     carvedilol (COREG) 25 MG tablet Take 25 mg by mouth 2 (two) times daily with a meal.     cetirizine (ZYRTEC) 10 MG tablet Take 10 mg by mouth daily.     cyclobenzaprine (FLEXERIL) 10 MG tablet Take 10 mg by mouth 3 (three) times daily.     estrogens, conjugated, (PREMARIN) 1.25 MG tablet Take 1.25 mg by mouth daily.     ferrous sulfate 325 (65 FE) MG tablet Take 325 mg by mouth daily with breakfast.     fluticasone (FLONASE) 50 MCG/ACT nasal spray Place 1 spray into both  nostrils daily as needed for allergies.      furosemide (LASIX) 20 MG tablet Take 40 mg by mouth daily.     gabapentin (NEURONTIN) 300 MG capsule Take 300 mg by mouth 3 (three) times daily.     insulin aspart (NOVOLOG) 100 UNIT/ML injection Inject 25 Units into the skin 3 (three) times daily before meals.     JARDIANCE 25 MG TABS tablet Take 1 tablet by mouth daily.     LEVEMIR FLEXTOUCH 100 UNIT/ML Pen Inject 40 Units into the skin 2 (two) times daily.  12   omeprazole (PRILOSEC) 40 MG capsule Take 40 mg by mouth daily.   6   oxyCODONE-acetaminophen (PERCOCET) 10-325 MG per tablet Take 1 tablet by mouth 3 (three) times daily.     OZEMPIC, 0.25 OR 0.5 MG/DOSE, 2 MG/1.5ML SOPN Inject 0.5 mg into the  skin every Wednesday.   3   potassium chloride SA (K-DUR,KLOR-CON) 20 MEQ tablet Take 20 mEq by mouth daily.     ramipril (ALTACE) 10 MG capsule Take 10 mg by mouth daily.     sertraline (ZOLOFT) 100 MG tablet Take 100 mg by mouth daily.     triamcinolone cream (KENALOG) 0.1 % Apply 1 application topically 2 (two) times daily as needed (dry skin).      warfarin (COUMADIN) 1 MG tablet Take 1 mg by mouth See admin instructions. Take with the 6 mg tablet to equal 7 mg daily in the evening     warfarin (COUMADIN) 6 MG tablet Take 6 mg by mouth See admin instructions. Take with 1 mg tablet to equal 7 mg daily in the evening     No current facility-administered medications for this visit.    Allergies:   Canagliflozin and Tetanus toxoids   Social History: Social History   Socioeconomic History   Marital status: Single    Spouse name: Not on file   Number of children: Not on file   Years of education: Not on file   Highest education level: Not on file  Occupational History   Not on file  Tobacco Use   Smoking status: Former Smoker   Smokeless tobacco: Never Used  Substance and Sexual Activity   Alcohol use: No   Drug use: No   Sexual activity: Never  Other Topics Concern   Not on file  Social History Narrative   Not on file   Social Determinants of Health   Financial Resource Strain: Not on file  Food Insecurity: Not on file  Transportation Needs: Not on file  Physical Activity: Not on file  Stress: Not on file  Social Connections: Not on file  Intimate Partner Violence: Not on file    Family History: Family History  Problem Relation Age of Onset   Heart disease Father    Hypertension Father    Anxiety disorder Sister    Diabetes Mellitus II Sister    Hypertension Sister    Colon polyps Sister    Hyperlipidemia Sister    Anxiety disorder Brother    Heart disease Brother    Depression Brother    Hyperlipidemia Brother     Review of  Systems: All other systems reviewed and are otherwise negative except as noted above.   Physical Exam: VS:  BP 140/74    Pulse 85    Ht 5\' 10"  (1.778 m)    Wt 238 lb 6.4 oz (108.1 kg)    SpO2 96%    BMI 34.21 kg/m  ,  BMI Body mass index is 34.21 kg/m.  GEN- The patient is well appearing, alert and oriented x 3 today.   HEENT: normocephalic, atraumatic; sclera clear, conjunctiva pink; hearing intact; oropharynx clear; neck supple Lungs- Clear to ausculation bilaterally, normal work of breathing.  No wheezes, rales, rhonchi Heart- Regular rate and rhythm (paced) GI- soft, non-tender, non-distended, bowel sounds present Extremities- no clubbing, cyanosis, or edema  MS- no significant deformity or atrophy Skin- warm and dry, no rash or lesion; ICD pocket well healed Psych- euthymic mood, full affect Neuro- strength and sensation are intact  ICD interrogation- reviewed in detail today,  See PACEART report  EKG:  EKG is ordered today. The ekg ordered today shows SR with CRT pacing   Recent Labs: No results found for requested labs within last 8760 hours.   Wt Readings from Last 3 Encounters:  05/20/20 238 lb 6.4 oz (108.1 kg)  04/17/18 253 lb 12.8 oz (115.1 kg)  03/19/18 254 lb (115.2 kg)     Other studies Reviewed: Additional studies/ records that were reviewed today include: Dr Jenel Lucks office notes    Assessment and Plan:  1.  Chronic systolic dysfunction euvolemic today Stable on an appropriate medical regimen Normal ICD function See Pace Art report No changes today  2.  S/p AVR Followed by Dr Donna Hopkins  3.  Social challenges I have reached out to Donna Hopkins and asked her to try to contact her again for support    Current medicines are reviewed at length with the patient today.   The patient does not have concerns regarding her medicines.  The following changes were made today:  none  Labs/ tests ordered today include:  No orders of the defined types were  placed in this encounter.    Disposition:   Follow up with Merlin, 1 year Dr Donna Hopkins     Signed, Gypsy Balsam, NP 05/20/2020 10:16 AM  [facility] [address] [place] [address] [phone] (office) [phone] (fax)

## 2020-05-26 ENCOUNTER — Telehealth: Payer: Self-pay | Admitting: Licensed Clinical Social Worker

## 2020-05-26 NOTE — Telephone Encounter (Signed)
SW received referral from provider to contact patient regarding housing/utility issues. Patient reports her utility bill has been paid in full by the city although she needs a letter stating she has a pacemaker and requires electricity for monthly checks and power should not be shut off. CSW followed up with provider for letter request. Patient also has a section 8 voucher and has lived in current apartment for 11 years and they will no longer accept section 8 vouchers. Patient has until the end of the month to get out to a new apartment. Patient reports she has an appointment on Saturday for a possible apartment although it is for a 2 bedroom and she has only been approved for a 1 bedroom with her new voucher. Patient asking for a letter stating she needs a two bedroom although does not appear to have a reason for needing two bedroom other than "I have too much stuff". CSW asked patient to have section 8 caseworker to call CSW to discuss further. Patient verbalizes understanding and will return call to CSW. CSW available to speak with caseworker. Lasandra Beech, LCSW, CCSW-MCS [phone]

## 2020-05-30 ENCOUNTER — Ambulatory Visit (INDEPENDENT_AMBULATORY_CARE_PROVIDER_SITE_OTHER): Payer: Medicare Other

## 2020-05-30 DIAGNOSIS — I5022 Chronic systolic (congestive) heart failure: Secondary | ICD-10-CM | POA: Diagnosis not present

## 2020-05-30 DIAGNOSIS — Z95810 Presence of automatic (implantable) cardiac defibrillator: Secondary | ICD-10-CM

## 2020-06-01 ENCOUNTER — Telehealth: Payer: Self-pay

## 2020-06-01 NOTE — Telephone Encounter (Signed)
Remote ICM transmission received.  Attempted call to patient regarding ICM remote transmission and mail box is full. 

## 2020-06-01 NOTE — Progress Notes (Signed)
EPIC Encounter for ICM Monitoring  Patient Name: Donna Hopkins is a 65 y.o. female Date: 06/01/2020 Primary Care Physican: Dennard Schaumann, MD Primary Cardiologist:Rosario Electrophysiologist:Allred Bi-V Pacing:99% 12/10/2021Weight:243lbs  AT/AF Burden: <1% (takes Warfarin)  Attempted call to patient and unable to reach.  Transmission reviewed.   CorvueThoracic impedancesuggesting normal fluid levels.  Prescribed:   Furosemide20 mgTake2 tablets (40 mgtotal)by mouth daily.  Potassium 20 mEq take 1 tablet daily.  Labs: 04/17/2018 Creatinine0.74, BUN10, Potassium4.2, Sodium132, GFR>60  04/14/2018 Creatinine0.76, BUN9, Potassium 4.0, Sodium135, LNL89-21 A complete set of results can be found in Results Review.  Recommendations:  Unable to reach.    Follow-up plan: ICM clinic phone appointment on4/25/2022. 91 day device clinic remote transmission5/16/2022.   EP/Cardiology Office Visits: Recall for 05/15/2021 withDr Allred.   Copy of ICM check sent to Dr.Allred.  3 month ICM trend: 05/30/2020.    1 Year ICM trend:       Karie Soda, RN 06/01/2020 9:47 AM

## 2020-06-10 ENCOUNTER — Telehealth (HOSPITAL_COMMUNITY): Payer: Self-pay | Admitting: Licensed Clinical Social Worker

## 2020-06-10 DIAGNOSIS — I519 Heart disease, unspecified: Secondary | ICD-10-CM

## 2020-06-10 NOTE — Progress Notes (Signed)
  Heart and Vascular Care Navigation  06/10/2020  Venetta Knee [birth year] [phone]  Reason for Referral: CSW followed up with patient regarding housing.   Engaged with patient by telephone for follow up visit for Heart and Vascular Care Coordination.                                                                                                   Assessment:   CSW contacted patient to follow up on housing resources provided. Patient has been told by the new complex owners that the complex will no longer be accepting section 8 vouchers. Patient unable to afford the [facility] market rent of $900.  Patient reports she has followed up on resources and continues to call various housing options with no luck at this time.                                  HRT/VAS Care Coordination    Home Assistive Devices/Equipment Eyeglasses; Dentures (specify type)      Social History:                                                                             SDOH Screenings   Alcohol Screen: Not on file  Depression (MVE7-2): Not on file  Financial Resource Strain: Not on file  Food Insecurity: Not on file  Housing: Not on file  Physical Activity: Not on file  Social Connections: Not on file  Stress: Not on file  Tobacco Use: Medium Risk  . Smoking Tobacco Use: Former Smoker  . Smokeless Tobacco Use: Never Used  Transportation Needs: Not on file    SDOH Interventions: Financial Resources:    n/a  Food Insecurity:   n/a  Housing Insecurity:   provided Investment banker, operational:    n/a    Follow-up plan:  CSW spoke with patient and discussed housing resources and gave contact information for [employer]. Patient will follow up and return call to CSW if needed. Lasandra Beech, LCSW, CCSW-MCS [phone]

## 2020-07-04 ENCOUNTER — Ambulatory Visit (INDEPENDENT_AMBULATORY_CARE_PROVIDER_SITE_OTHER): Payer: Medicare Other

## 2020-07-04 DIAGNOSIS — Z95810 Presence of automatic (implantable) cardiac defibrillator: Secondary | ICD-10-CM | POA: Diagnosis not present

## 2020-07-04 DIAGNOSIS — I519 Heart disease, unspecified: Secondary | ICD-10-CM

## 2020-07-06 NOTE — Progress Notes (Signed)
EPIC Encounter for ICM Monitoring  Patient Name: Donna Hopkins is a 65 y.o. female Date: 07/06/2020 Primary Care Physican: Dennard Schaumann, MD Primary Cardiologist:Rosario Electrophysiologist:Allred Bi-V Pacing:>99% 4/27/2022Weight:243lbs  AT/AF Burden: 0% (takes Warfarin)  Spoke with patient and reports feeling well at this time.  Denies fluid symptoms.    CorvueThoracic impedancesuggesting normal fluid levels.  Prescribed:   Furosemide20 mgTake2 tablets (40 mgtotal)by mouth daily.  Potassium 20 mEq take 1 tablet daily.  Labs: 04/17/2018 Creatinine0.74, BUN10, Potassium4.2, Sodium132, GFR>60  04/14/2018 Creatinine0.76, BUN9, Potassium 4.0, Sodium135, DGU44-03 A complete set of results can be found in Results Review.  Recommendations: No changes and encouraged to call if experiencing any fluid symptoms.  Follow-up plan: ICM clinic phone appointment on6/08/2020. 91 day device clinic remote transmission5/16/2022.   EP/Cardiology Office Visits: Recall for 05/15/2021 withDr Allred.   Copy of ICM check sent to Dr.Allred.  3 month ICM trend: 07/04/2020.    1 Year ICM trend:       Karie Soda, RN 07/06/2020 8:33 AM

## 2020-07-25 ENCOUNTER — Ambulatory Visit (INDEPENDENT_AMBULATORY_CARE_PROVIDER_SITE_OTHER): Payer: Medicare Other

## 2020-07-25 DIAGNOSIS — Z95810 Presence of automatic (implantable) cardiac defibrillator: Secondary | ICD-10-CM

## 2020-07-26 LAB — CUP PACEART REMOTE DEVICE CHECK
Battery Remaining Longevity: 50 mo
Battery Remaining Percentage: 68 %
Battery Voltage: 2.96 V
Brady Statistic AP VP Percent: 1 %
Brady Statistic AP VS Percent: 1 %
Brady Statistic AS VP Percent: 99 %
Brady Statistic AS VS Percent: 1 %
Brady Statistic RA Percent Paced: 1 %
Date Time Interrogation Session: 20220516020017
HighPow Impedance: 78 Ohm
HighPow Impedance: 78 Ohm
Implantable Lead Implant Date: 20140429
Implantable Lead Implant Date: 20140617
Implantable Lead Implant Date: 20200205
Implantable Lead Location: 753858
Implantable Lead Location: 753859
Implantable Lead Location: 753860
Implantable Pulse Generator Implant Date: 20200205
Lead Channel Impedance Value: 410 Ohm
Lead Channel Impedance Value: 480 Ohm
Lead Channel Impedance Value: 700 Ohm
Lead Channel Pacing Threshold Amplitude: 0.5 V
Lead Channel Pacing Threshold Amplitude: 0.75 V
Lead Channel Pacing Threshold Amplitude: 1.875 V
Lead Channel Pacing Threshold Pulse Width: 0.5 ms
Lead Channel Pacing Threshold Pulse Width: 0.5 ms
Lead Channel Pacing Threshold Pulse Width: 0.5 ms
Lead Channel Sensing Intrinsic Amplitude: 12 mV
Lead Channel Sensing Intrinsic Amplitude: 3.3 mV
Lead Channel Setting Pacing Amplitude: 2 V
Lead Channel Setting Pacing Amplitude: 2.5 V
Lead Channel Setting Pacing Amplitude: 2.875
Lead Channel Setting Pacing Pulse Width: 0.5 ms
Lead Channel Setting Pacing Pulse Width: 0.5 ms
Lead Channel Setting Sensing Sensitivity: 0.5 mV
Pulse Gen Serial Number: 9879509

## 2020-08-15 ENCOUNTER — Ambulatory Visit (INDEPENDENT_AMBULATORY_CARE_PROVIDER_SITE_OTHER): Payer: Medicare Other

## 2020-08-15 DIAGNOSIS — I5022 Chronic systolic (congestive) heart failure: Secondary | ICD-10-CM | POA: Diagnosis not present

## 2020-08-15 DIAGNOSIS — Z95810 Presence of automatic (implantable) cardiac defibrillator: Secondary | ICD-10-CM

## 2020-08-17 ENCOUNTER — Telehealth: Payer: Self-pay

## 2020-08-17 NOTE — Progress Notes (Signed)
EPIC Encounter for ICM Monitoring  Patient Name: Donna Hopkins is a 65 y.o. female Date: 08/17/2020 Primary Care Physican: Dennard Schaumann, MD Primary Cardiologist:Rosario Electrophysiologist:Allred Bi-V Pacing:>99% 08/17/2020 Weight: 242 lbs  AT/AF Burden: 0% (takes Warfarin)  Spoke with patient and reports her clothes feeling a little tight so she may have a little fluid from eating too much ice and fluid intake.  CorvueThoracic impedancesuggesting normal fluid levels.  Prescribed:   Furosemide20 mgTake2 tablets (40 mgtotal)by mouth daily.  Potassium 20 mEq take 1 tablet daily.  Labs: 04/17/2018 Creatinine0.74, BUN10, Potassium4.2, Sodium132, GFR>60  04/14/2018 Creatinine0.76, BUN9, Potassium 4.0, Sodium135, MBW46-65 A complete set of results can be found in Results Review.  Recommendations:Recommendation to fuid intake to 64 oz daily.  Encouraged to call if experiencing any fluid symptoms.   Follow-up plan: ICM clinic phone appointment on7/01/2021. 91 day device clinic remote transmission8/15/2022.   EP/Cardiology Office Visits: Recall for 3/6/2023withDr Allred.   Copy of ICM check sent to Dr.Allred.  3 month ICM trend: 08/15/2020.    1 Year ICM trend:       Karie Soda, RN 08/17/2020 3:19 PM

## 2020-08-17 NOTE — Telephone Encounter (Signed)
Remote ICM transmission received.  Attempted call to patient regarding ICM remote transmission and left detailed message per DPR.  Advised to return call for any fluid symptoms or questions. Next ICM remote transmission scheduled 09/19/2020.     

## 2020-08-17 NOTE — Progress Notes (Signed)
Remote ICD transmission.   

## 2020-09-19 ENCOUNTER — Ambulatory Visit (INDEPENDENT_AMBULATORY_CARE_PROVIDER_SITE_OTHER): Payer: Medicare Other

## 2020-09-19 DIAGNOSIS — I5022 Chronic systolic (congestive) heart failure: Secondary | ICD-10-CM

## 2020-09-19 DIAGNOSIS — Z95810 Presence of automatic (implantable) cardiac defibrillator: Secondary | ICD-10-CM

## 2020-09-21 ENCOUNTER — Telehealth: Payer: Self-pay

## 2020-09-21 NOTE — Telephone Encounter (Signed)
Remote ICM transmission received.  Attempted call to patient regarding ICM remote transmission and mail box is full. 

## 2020-09-21 NOTE — Progress Notes (Signed)
EPIC Encounter for ICM Monitoring  Patient Name: Donna Hopkins is a 65 y.o. female Date: 09/21/2020 Primary Care Physican: Dennard Schaumann, MD Primary Cardiologist: Hanley Hays Electrophysiologist: Allred Bi-V Pacing: >99%          08/17/2020 Weight: 242 lbs   AT/AF Burden: 0% (takes Warfarin)                                                          Attempted call to patient and unable to reach.  Transmission reviewed.    Corvue Thoracic impedance suggesting normal fluid levels.   Prescribed: Furosemide 20 mg Take 2 tablets (40 mg total) by mouth daily. Potassium 20 mEq take 1 tablet daily.   Labs: 04/17/2018 Creatinine 0.74, BUN 10, Potassium 4.2, Sodium 132, GFR >60 04/14/2018 Creatinine 0.76, BUN 9,   Potassium 4.0, Sodium 135, GFR 83-96  A complete set of results can be found in Results Review.   Recommendations:  Unable to reach.      Follow-up plan: ICM clinic phone appointment on 10/25/2020.   91 day device clinic remote transmission 10/24/2020.     EP/Cardiology Office Visits:  Recall for 05/15/2021 with Dr Johney Frame.     Copy of ICM check sent to Dr. Johney Frame. .   3 month ICM trend: 09/19/2020.    1 Year ICM trend:       Karie Soda, RN 09/21/2020 4:29 PM

## 2020-09-26 IMAGING — DX DG CHEST 2V
2 series · 2 of 2 positions shown · non-contrast
Comparison: Chest radiographs 10/30/2015.

CLINICAL DATA: 62-year-old female with possible RV lead failure.
Shortness of breath. Nonischemic cardiomyopathy.

EXAM:
CHEST - 2 VIEW

[dg chest 2 view (1 of 2)]
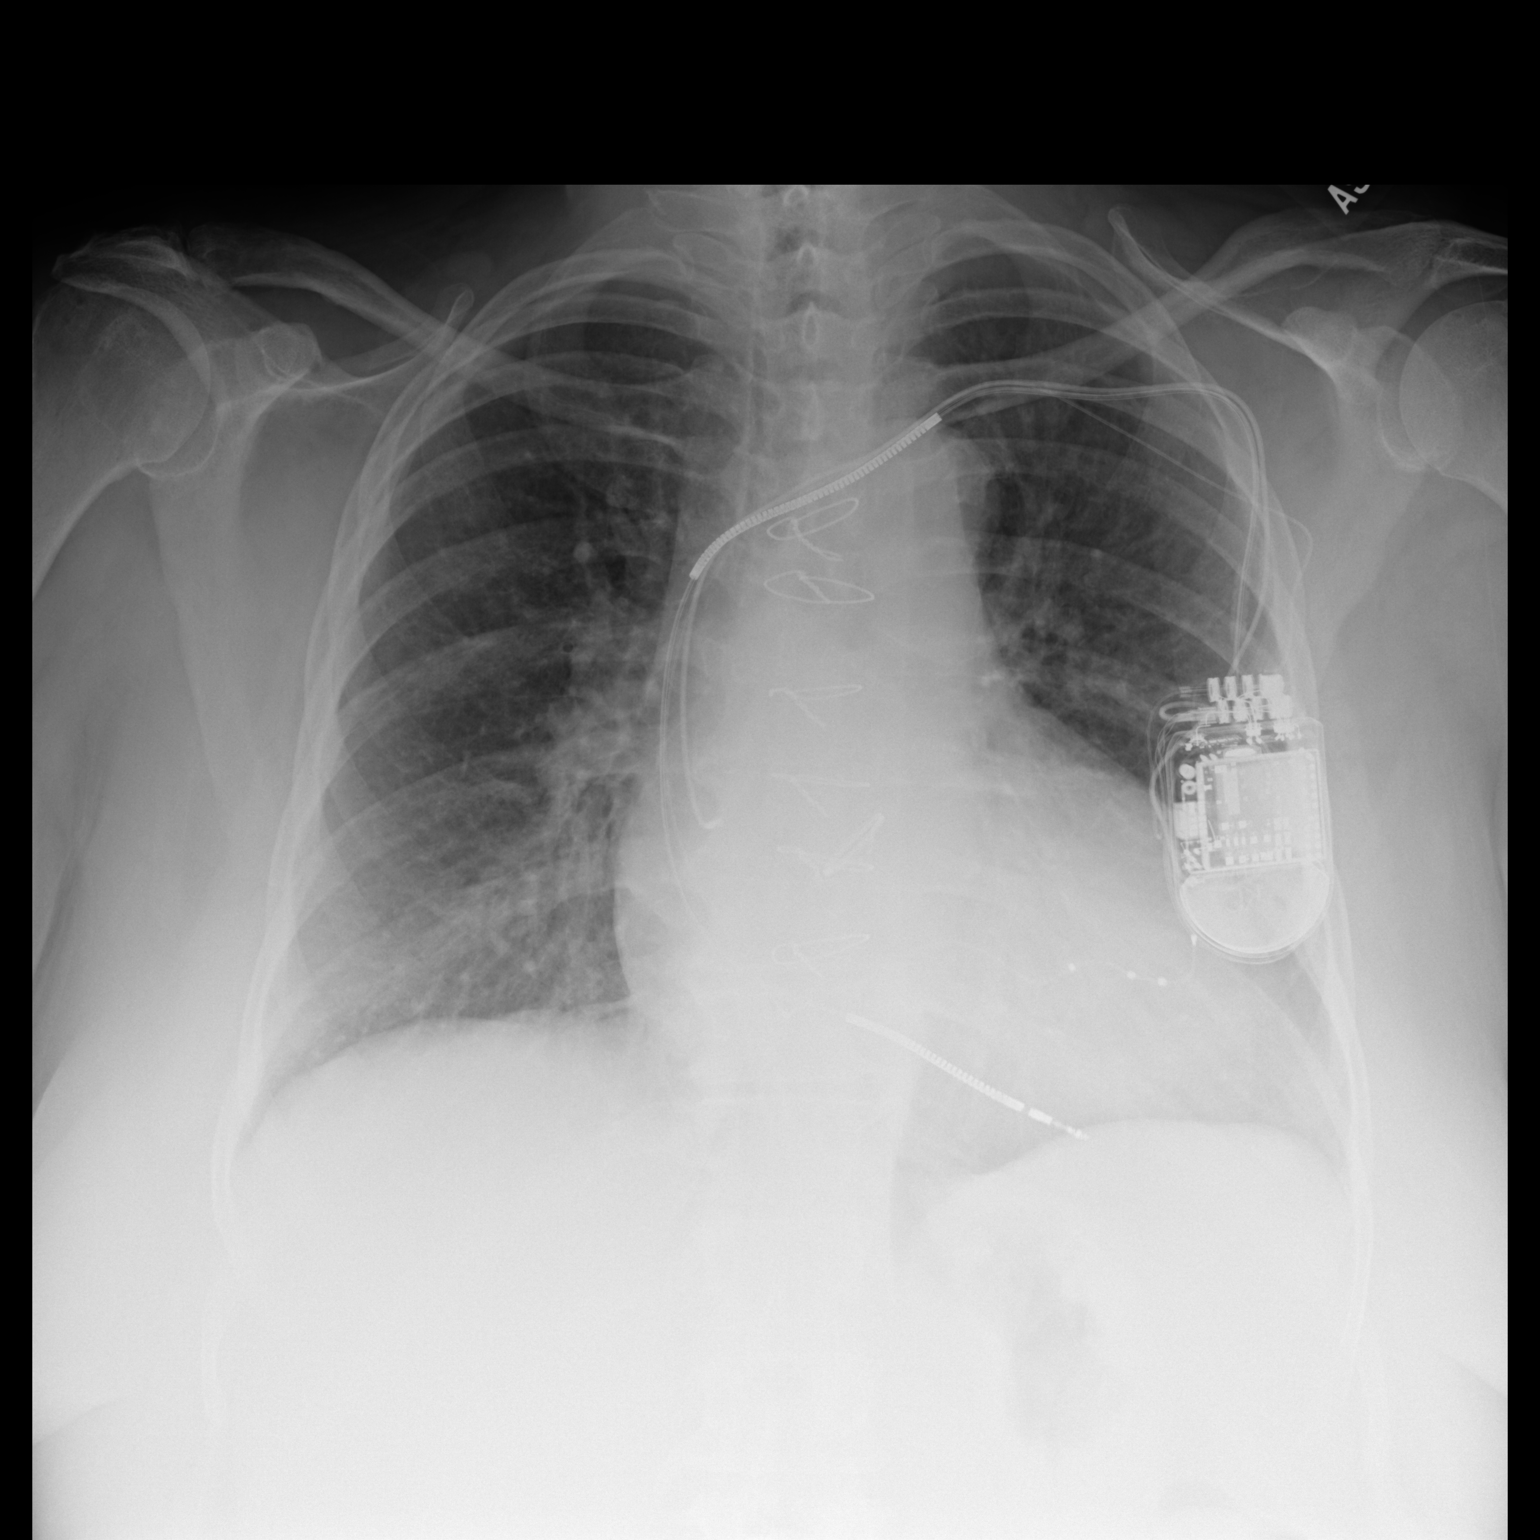

[dg chest 2 view (2 of 2)]
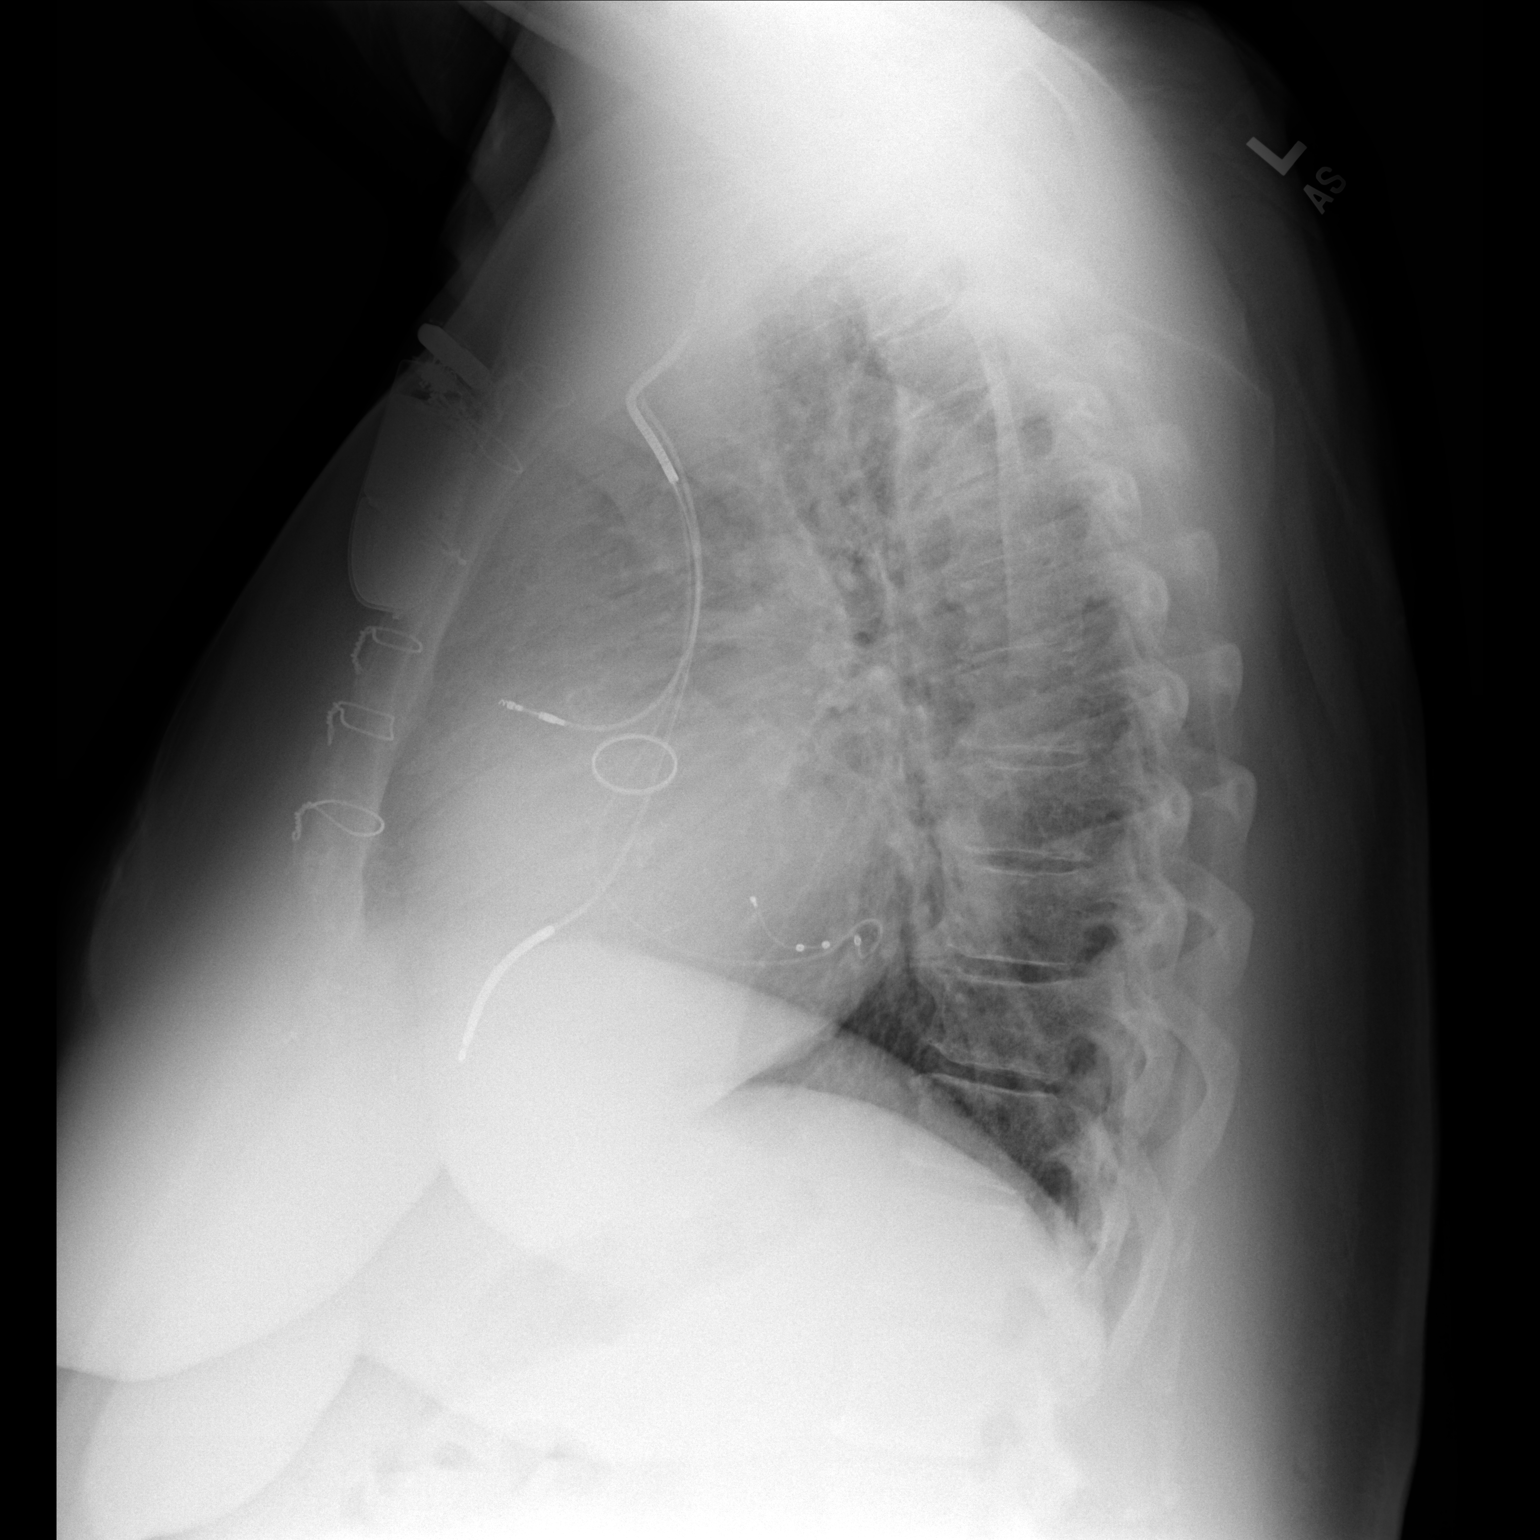

[2 of 2 positions shown; findings below may reference images not displayed]

FINDINGS: Chronic left chest AICD. Lead positioning is stable since 7333 and
no lead abnormality is identified.

Stable cardiomegaly and mediastinal contours. Prior aortic valve
surgery. Stable lung volumes. Stable pulmonary vascularity without
acute edema. No pleural effusion, pneumothorax or confluent
pulmonary opacity. No acute osseous abnormality identified.

Negative visible bowel gas pattern.
IMPRESSION: 1. Stable radiographic appearance of the 3 lead left chest AICD
since [DATE].  No acute cardiopulmonary abnormality.

## 2020-10-24 ENCOUNTER — Ambulatory Visit (INDEPENDENT_AMBULATORY_CARE_PROVIDER_SITE_OTHER): Payer: Medicare Other

## 2020-10-24 DIAGNOSIS — I428 Other cardiomyopathies: Secondary | ICD-10-CM | POA: Diagnosis not present

## 2020-10-25 ENCOUNTER — Ambulatory Visit (INDEPENDENT_AMBULATORY_CARE_PROVIDER_SITE_OTHER): Payer: Medicare Other

## 2020-10-25 DIAGNOSIS — Z95810 Presence of automatic (implantable) cardiac defibrillator: Secondary | ICD-10-CM | POA: Diagnosis not present

## 2020-10-25 DIAGNOSIS — I5022 Chronic systolic (congestive) heart failure: Secondary | ICD-10-CM

## 2020-10-26 LAB — CUP PACEART REMOTE DEVICE CHECK
Battery Remaining Longevity: 47 mo
Battery Remaining Percentage: 64 %
Battery Voltage: 2.95 V
Brady Statistic AP VP Percent: 1 %
Brady Statistic AP VS Percent: 1 %
Brady Statistic AS VP Percent: 99 %
Brady Statistic AS VS Percent: 1 %
Brady Statistic RA Percent Paced: 1 %
Date Time Interrogation Session: 20220815030704
HighPow Impedance: 78 Ohm
HighPow Impedance: 78 Ohm
Implantable Lead Implant Date: 20140429
Implantable Lead Implant Date: 20140617
Implantable Lead Implant Date: 20200205
Implantable Lead Location: 753858
Implantable Lead Location: 753859
Implantable Lead Location: 753860
Implantable Pulse Generator Implant Date: 20200205
Lead Channel Impedance Value: 410 Ohm
Lead Channel Impedance Value: 490 Ohm
Lead Channel Impedance Value: 700 Ohm
Lead Channel Pacing Threshold Amplitude: 0.5 V
Lead Channel Pacing Threshold Amplitude: 0.875 V
Lead Channel Pacing Threshold Amplitude: 1.75 V
Lead Channel Pacing Threshold Pulse Width: 0.5 ms
Lead Channel Pacing Threshold Pulse Width: 0.5 ms
Lead Channel Pacing Threshold Pulse Width: 0.5 ms
Lead Channel Sensing Intrinsic Amplitude: 12 mV
Lead Channel Sensing Intrinsic Amplitude: 3.8 mV
Lead Channel Setting Pacing Amplitude: 2 V
Lead Channel Setting Pacing Amplitude: 2.5 V
Lead Channel Setting Pacing Amplitude: 2.75 V
Lead Channel Setting Pacing Pulse Width: 0.5 ms
Lead Channel Setting Pacing Pulse Width: 0.5 ms
Lead Channel Setting Sensing Sensitivity: 0.5 mV
Pulse Gen Serial Number: 9879509

## 2020-10-28 NOTE — Progress Notes (Signed)
EPIC Encounter for ICM Monitoring  Patient Name: Donna Hopkins is a 65 y.o. female Date: 10/28/2020 Primary Care Physican: Dennard Schaumann, MD Primary Cardiologist: Hanley Hays Electrophysiologist: Allred Bi-V Pacing: >99%          08/17/2020 Weight: 242 lbs   AT/AF Burden: 0% (takes Warfarin)                                                          Spoke with patient and heart failure questions reviewed.  Pt asymptomatic for fluid accumulation and feeling well.   Corvue Thoracic impedance suggesting dryness starting 8/11 but trending back to baseline normal.  Suggesting possible fluid accumulation x 11 days from 7/31-8/11.   Prescribed: Furosemide 20 mg Take 2 tablets (40 mg total) by mouth daily. Potassium 20 mEq take 1 tablet daily.   Labs: 04/17/2018 Creatinine 0.74, BUN 10, Potassium 4.2, Sodium 132, GFR >60 04/14/2018 Creatinine 0.76, BUN 9,   Potassium 4.0, Sodium 135, GFR 83-96  A complete set of results can be found in Results Review.   Recommendations: No changes and encouraged to drink about 75-80 fluid today to help with hydration and then return to limit of 64 oz daily.   Follow-up plan: ICM clinic phone appointment on 11/28/2020.   91 day device clinic remote transmission 01/23/2021.     EP/Cardiology Office Visits:  Recall for 05/15/2021 with Dr Johney Frame.     Copy of ICM check sent to Dr. Johney Frame.   3 month ICM trend: 10/24/2020.    1 Year ICM trend:       Karie Soda, RN 10/28/2020 8:23 AM

## 2020-11-11 NOTE — Progress Notes (Signed)
Remote ICD transmission.   

## 2020-11-12 ENCOUNTER — Other Ambulatory Visit: Payer: Self-pay

## 2020-11-12 ENCOUNTER — Encounter (HOSPITAL_BASED_OUTPATIENT_CLINIC_OR_DEPARTMENT_OTHER): Payer: Self-pay | Admitting: Emergency Medicine

## 2020-11-12 ENCOUNTER — Emergency Department (HOSPITAL_BASED_OUTPATIENT_CLINIC_OR_DEPARTMENT_OTHER)
Admission: EM | Admit: 2020-11-12 | Discharge: 2020-11-12 | Disposition: A | Discharge status: Home / Self Care | Payer: Medicare Other | Attending: Emergency Medicine | Admitting: Emergency Medicine

## 2020-11-12 DIAGNOSIS — N183 Chronic kidney disease, stage 3 unspecified: Secondary | ICD-10-CM | POA: Insufficient documentation

## 2020-11-12 DIAGNOSIS — I5022 Chronic systolic (congestive) heart failure: Secondary | ICD-10-CM | POA: Diagnosis not present

## 2020-11-12 DIAGNOSIS — I130 Hypertensive heart and chronic kidney disease with heart failure and stage 1 through stage 4 chronic kidney disease, or unspecified chronic kidney disease: Secondary | ICD-10-CM | POA: Insufficient documentation

## 2020-11-12 DIAGNOSIS — X58XXXA Exposure to other specified factors, initial encounter: Secondary | ICD-10-CM | POA: Insufficient documentation

## 2020-11-12 DIAGNOSIS — Z95810 Presence of automatic (implantable) cardiac defibrillator: Secondary | ICD-10-CM | POA: Diagnosis not present

## 2020-11-12 DIAGNOSIS — Z794 Long term (current) use of insulin: Secondary | ICD-10-CM | POA: Diagnosis not present

## 2020-11-12 DIAGNOSIS — E119 Type 2 diabetes mellitus without complications: Secondary | ICD-10-CM | POA: Insufficient documentation

## 2020-11-12 DIAGNOSIS — Z79899 Other long term (current) drug therapy: Secondary | ICD-10-CM | POA: Diagnosis not present

## 2020-11-12 DIAGNOSIS — S30814A Abrasion of vagina and vulva, initial encounter: Secondary | ICD-10-CM | POA: Diagnosis not present

## 2020-11-12 DIAGNOSIS — Z87891 Personal history of nicotine dependence: Secondary | ICD-10-CM | POA: Diagnosis not present

## 2020-11-12 DIAGNOSIS — R791 Abnormal coagulation profile: Secondary | ICD-10-CM | POA: Diagnosis not present

## 2020-11-12 DIAGNOSIS — S3994XA Unspecified injury of external genitals, initial encounter: Secondary | ICD-10-CM | POA: Diagnosis present

## 2020-11-12 DIAGNOSIS — Z7901 Long term (current) use of anticoagulants: Secondary | ICD-10-CM | POA: Insufficient documentation

## 2020-11-12 LAB — WET PREP, GENITAL
Sperm: NONE SEEN
Trich, Wet Prep: NONE SEEN
WBC, Wet Prep HPF POC: NONE SEEN
Yeast Wet Prep HPF POC: NONE SEEN

## 2020-11-12 LAB — BASIC METABOLIC PANEL
Anion gap: 8 (ref 5–15)
BUN: 17 mg/dL (ref 8–23)
CO2: 27 mmol/L (ref 22–32)
Calcium: 8.7 mg/dL — ABNORMAL LOW (ref 8.9–10.3)
Chloride: 99 mmol/L (ref 98–111)
Creatinine, Ser: 0.75 mg/dL (ref 0.44–1.00)
GFR, Estimated: >60 mL/min (ref >60)
Glucose, Bld: 212 mg/dL — ABNORMAL HIGH (ref 70–99)
Potassium: 4 mmol/L (ref 3.5–5.1)
Sodium: 134 mmol/L — ABNORMAL LOW (ref 135–145)

## 2020-11-12 LAB — CBC WITH DIFFERENTIAL/PLATELET
Abs Immature Granulocytes: 0.03 10*3/uL (ref 0.00–0.07)
Basophils Absolute: 0 10*3/uL (ref 0.0–0.1)
Basophils Relative: 1 %
Eosinophils Absolute: 0.2 10*3/uL (ref 0.0–0.5)
Eosinophils Relative: 4 %
HCT: 39 % (ref 36.0–46.0)
Hemoglobin: 13.1 g/dL (ref 12.0–15.0)
Immature Granulocytes: 0 %
Lymphocytes Relative: 32 %
Lymphs Abs: 2.2 10*3/uL (ref 0.7–4.0)
MCH: 32.3 pg (ref 26.0–34.0)
MCHC: 33.6 g/dL (ref 30.0–36.0)
MCV: 96.3 fL (ref 80.0–100.0)
Monocytes Absolute: 0.6 10*3/uL (ref 0.1–1.0)
Monocytes Relative: 9 %
Neutro Abs: 3.7 10*3/uL (ref 1.7–7.7)
Neutrophils Relative %: 54 %
Platelets: 217 10*3/uL (ref 150–400)
RBC: 4.05 MIL/uL (ref 3.87–5.11)
RDW: 13.4 % (ref 11.5–15.5)
WBC: 6.8 10*3/uL (ref 4.0–10.5)
nRBC: 0 % (ref 0.0–0.2)

## 2020-11-12 LAB — URINALYSIS, ROUTINE W REFLEX MICROSCOPIC
Bilirubin Urine: NEGATIVE
Glucose, UA: >=500 mg/dL — AB
Ketones, ur: NEGATIVE mg/dL
Leukocytes,Ua: NEGATIVE
Nitrite: NEGATIVE
Protein, ur: NEGATIVE mg/dL
Specific Gravity, Urine: 1.01 (ref 1.005–1.030)
pH: 6 (ref 5.0–8.0)

## 2020-11-12 LAB — URINALYSIS, MICROSCOPIC (REFLEX)

## 2020-11-12 LAB — PROTIME-INR
INR: 3.8 — ABNORMAL HIGH (ref 0.8–1.2)
Prothrombin Time: 37.6 seconds — ABNORMAL HIGH (ref 11.4–15.2)

## 2020-11-12 MED ORDER — KETOROLAC TROMETHAMINE 60 MG/2ML IM SOLN
30.0000 mg | Freq: Once | INTRAMUSCULAR | Status: AC
  Administered 2020-11-12: 30 mg via INTRAMUSCULAR
  Filled 2020-11-12: qty 2

## 2020-11-12 NOTE — Discharge Instructions (Addendum)
Your blood is a little too thin today. Please hold your coumadin for 2 days and discuss a dose adjustment with your doctors.

## 2020-11-12 NOTE — ED Triage Notes (Signed)
Pt reports vaginal bleeding x 3d; reports lower back pain; takes Coumadin

## 2020-11-12 NOTE — ED Provider Notes (Signed)
[facility] EMERGENCY DEPARTMENT Provider Note  CSN: [id number] Arrival date & time: 11/12/20 1244    History Chief Complaint  Patient presents with   Vaginal Bleeding    Donna Hopkins is a 65 y.o. female with history of mechanical valve on coumadin and prior hysterectomy reports she has noticed blood on the tissue after urinating for the last 3 days off and on. No pain with urination, flank pain, vomiting or fever. No lightheaded or dizziness. She had her coumadin increased a few weeks ago due to low INR. She is sexually active but denies injury during intercourse. She denies any rectal bleeding.   She has a secondary complaint of needing a referral to podiatry for onychomycosis.    Past Medical History:  Diagnosis Date   Carotid stenosis, asymptomatic, bilateral    Chronic kidney disease, stage III (moderate) (HCC)    Chronic low back pain    Clinical systolic heart failure, chronic (HCC)    Depression    Diabetes mellitus    Eosinophilic gastritis    GERD (gastroesophageal reflux disease)    Gout, renal disease    H/O mechanical aortic valve replacement    on coumadin   Hx of aortic valve replacement    Hypertension    Hypocalcemia    Hyponatremia    ICD (implantable cardioverter-defibrillator) in place    Medication refill    Mixed hyperlipidemia    Neuropathy    Obesity    OP (osteoporosis)    Peripheral vascular disease, unspecified (HCC)    Persistent insomnia    Personal history of colonic polyps    Pulmonary hypertension (HCC)    Snoring    declines sleep study   Tricuspid regurgitation    Uncontrolled type 2 diabetes mellitus with complication (HCC)    Valgus foot    Vertigo, benign positional    Vitamin D deficiency     Past Surgical History:  Procedure Laterality Date   ABDOMINAL HYSTERECTOMY     BACK SURGERY     BI-VENTRICULAR IMPLANTABLE CARDIOVERTER DEFIBRILLATOR  (CRT-D)  07/08/2012   SJM Quadra Assura BiV ICD implanted by Dr  Reed Pandy at [facility]   CARDIAC VALVE REPLACEMENT     LEAD INSERTION N/A 04/16/2018   Successful BiV ICD system revision with a new RV lead and pulse generator replacement with a [employer] Assura MP model [phone] ICD    Family History  Problem Relation Age of Onset   Heart disease Father    Hypertension Father    Anxiety disorder Sister    Diabetes Mellitus II Sister    Hypertension Sister    Colon polyps Sister    Hyperlipidemia Sister    Anxiety disorder Brother    Heart disease Brother    Depression Brother    Hyperlipidemia Brother     Social History   Tobacco Use   Smoking status: Former   Smokeless tobacco: Never  Substance Use Topics   Alcohol use: No   Drug use: No     Home Medications Prior to Admission medications   Medication Sig Start Date End Date Taking? Authorizing Provider  acetaminophen (TYLENOL) 650 MG CR tablet Take 1,300 mg by mouth daily as needed for pain.    [provider]  albuterol (PROVENTIL HFA;VENTOLIN HFA) 108 (90 BASE) MCG/ACT inhaler Inhale 2 puffs into the lungs every 6 (six) hours as needed for wheezing or shortness of breath.     [provider]  allopurinol (ZYLOPRIM)  300 MG tablet Take 300 mg by mouth 3 (three) times daily.     [provider]  ALPRAZolam Prudy Feeler) 0.5 MG tablet Take 0.5 mg by mouth at bedtime as needed for anxiety or sleep.    [provider]  atorvastatin (LIPITOR) 40 MG tablet Take 40 mg by mouth daily.    [provider]  carvedilol (COREG) 25 MG tablet Take 25 mg by mouth 2 (two) times daily with a meal.    [provider]  cetirizine (ZYRTEC) 10 MG tablet Take 10 mg by mouth daily.    [provider]  cyclobenzaprine (FLEXERIL) 10 MG tablet Take 10 mg by mouth 3 (three) times daily.    [provider]  estrogens, conjugated, (PREMARIN) 1.25 MG tablet Take 1.25 mg by mouth daily.    [provider]  ferrous sulfate 325 (65  FE) MG tablet Take 325 mg by mouth daily with breakfast.    [provider]  fluticasone (FLONASE) 50 MCG/ACT nasal spray Place 1 spray into both nostrils daily as needed for allergies.     [provider]  furosemide (LASIX) 20 MG tablet Take 40 mg by mouth daily.    [provider]  gabapentin (NEURONTIN) 300 MG capsule Take 300 mg by mouth 3 (three) times daily.    [provider]  insulin aspart (NOVOLOG) 100 UNIT/ML injection Inject 25 Units into the skin 3 (three) times daily before meals.    [provider]  JARDIANCE 25 MG TABS tablet Take 1 tablet by mouth daily. 05/30/18   [provider]  LEVEMIR FLEXTOUCH 100 UNIT/ML Pen Inject 40 Units into the skin 2 (two) times daily. 01/04/18   [provider]  omeprazole (PRILOSEC) 40 MG capsule Take 40 mg by mouth daily.  01/16/18   [provider]  oxyCODONE-acetaminophen (PERCOCET) 10-325 MG per tablet Take 1 tablet by mouth 3 (three) times daily.    [provider]  OZEMPIC, 0.25 OR 0.5 MG/DOSE, 2 MG/1.5ML SOPN Inject 0.5 mg into the skin every Wednesday.  12/30/17   [provider]  potassium chloride SA (K-DUR,KLOR-CON) 20 MEQ tablet Take 20 mEq by mouth daily.    [provider]  ramipril (ALTACE) 10 MG capsule Take 10 mg by mouth daily.    [provider]  sertraline (ZOLOFT) 100 MG tablet Take 100 mg by mouth daily.    [provider]  triamcinolone cream (KENALOG) 0.1 % Apply 1 application topically 2 (two) times daily as needed (dry skin).     [provider]  warfarin (COUMADIN) 1 MG tablet Take 1 mg by mouth See admin instructions. Take with the 6 mg tablet to equal 7 mg daily in the evening    [provider]  warfarin (COUMADIN) 6 MG tablet Take 6 mg by mouth See admin instructions. Take with 1 mg tablet to equal 7 mg daily in the evening 10/20/15   [provider]     Allergies     Canagliflozin and Tetanus toxoids   Review of Systems   Review of Systems A comprehensive review of systems was completed and negative except as noted in HPI.    Physical Exam BP 133/83 (BP Location: Left Arm)   Pulse 85   Temp 97.7 F (36.5 C) (Oral)   Resp 18   Ht 5' 9.5" (1.765 m)   Wt 104.8 kg   SpO2 100%   BMI 33.62 kg/m   Physical Exam Vitals and  nursing note reviewed.  Constitutional:      Appearance: Normal appearance.  HENT:     Head: Normocephalic and atraumatic.     Nose: Nose normal.     Mouth/Throat:     Mouth: Mucous membranes are moist.  Eyes:     Extraocular Movements: Extraocular movements intact.     Conjunctiva/sclera: Conjunctivae normal.  Cardiovascular:     Rate and Rhythm: Normal rate.  Pulmonary:     Effort: Pulmonary effort is normal.     Breath sounds: Normal breath sounds.  Abdominal:     General: Abdomen is flat.     Palpations: Abdomen is soft.     Tenderness: There is no abdominal tenderness.  Genitourinary:    Comments: Chaperone present. There is a small abrasion/superficial sore on the R upper labia minora, small amount of thin white discharge, cervix is surgically absent.  Musculoskeletal:        General: No swelling. Normal range of motion.     Cervical back: Neck supple.  Skin:    General: Skin is warm and dry.  Neurological:     General: No focal deficit present.     Mental Status: She is alert.  Psychiatric:        Mood and Affect: Mood normal.     ED Results / Procedures / Treatments   Labs (all labs ordered are listed, but only abnormal results are displayed) Labs Reviewed  WET PREP, GENITAL - Abnormal; Notable for the following components:      Result Value   Clue Cells Wet Prep HPF POC PRESENT (*)    All other components within normal limits  URINALYSIS, ROUTINE W REFLEX MICROSCOPIC - Abnormal; Notable for the following components:   Glucose, UA >=500 (*)    Hgb urine dipstick SMALL (*)    All other  components within normal limits  URINALYSIS, MICROSCOPIC (REFLEX) - Abnormal; Notable for the following components:   Bacteria, UA FEW (*)    All other components within normal limits  BASIC METABOLIC PANEL - Abnormal; Notable for the following components:   Sodium 134 (*)    Glucose, Bld 212 (*)    Calcium 8.7 (*)    All other components within normal limits  PROTIME-INR - Abnormal; Notable for the following components:   Prothrombin Time 37.6 (*)    INR 3.8 (*)    All other components within normal limits  CBC WITH DIFFERENTIAL/PLATELET  GC/CHLAMYDIA PROBE AMP ([facility]) NOT AT [facility]    EKG None   Radiology No results found.  Procedures Procedures  Medications Ordered in the ED Medications  ketorolac (TORADOL) injection 30 mg (has no administration in time range)     MDM Rules/Calculators/A&P MDM  Patient with small amount of bleeding when wiping after urination, no blood in urine, but there is a small abrasion on her labia which may be the source of bleeding in a patient on coumadin. Will check labs, including INR. Vaginal swabs collected as well.  ED Course  I have reviewed the triage vital signs and the nursing notes.  Pertinent labs & imaging results that were available during my care of the patient were reviewed by me and considered in my medical decision making (see chart for details).  Clinical Course as of 11/12/20 1617  Sat Nov 12, 2020  1525 UA with glucose, no blood or signs of infection.  [CS]  1538 CBC is normal.  [CS]  1548 INR is mildly elevated at 3.8.  [CS]  1557 Wet prep with clue cells but no WBC. BMP is unremarkable.  [CS]  1612 Patient sleeping comfortably on reassessment. Discussed labs with her, including supratherapeutic INR. Recommend she hold her coumadin today and tomorrow and discuss dose adjustment with PCP. She is now also requesting 'a pain shot' for her chronic back pain. Toradol ordered prior to discharge.  [CS]    Clinical Course  User Index [CS] Pollyann Savoy, MD    Final Clinical Impression(s) / ED Diagnoses Final diagnoses:  Abrasion of vulva, initial encounter  Supratherapeutic INR    Rx / DC Orders ED Discharge Orders          Ordered    Ambulatory referral to Podiatry        11/12/20 1616             Pollyann Savoy, MD 11/12/20 [phone]

## 2020-11-15 LAB — GC/CHLAMYDIA PROBE AMP (~~LOC~~) NOT AT ARMC
Chlamydia: NEGATIVE
Comment: NEGATIVE
Comment: NORMAL
Neisseria Gonorrhea: NEGATIVE

## 2020-11-18 ENCOUNTER — Ambulatory Visit (INDEPENDENT_AMBULATORY_CARE_PROVIDER_SITE_OTHER): Payer: Medicare Other | Admitting: Podiatrist

## 2020-11-18 ENCOUNTER — Other Ambulatory Visit: Payer: Self-pay

## 2020-11-18 ENCOUNTER — Encounter: Payer: Self-pay | Admitting: Podiatrist

## 2020-11-18 DIAGNOSIS — I739 Peripheral vascular disease, unspecified: Secondary | ICD-10-CM

## 2020-11-18 DIAGNOSIS — M79609 Pain in unspecified limb: Secondary | ICD-10-CM

## 2020-11-18 DIAGNOSIS — B351 Tinea unguium: Secondary | ICD-10-CM | POA: Diagnosis not present

## 2020-11-18 NOTE — Patient Instructions (Addendum)
I will call with the result of the culture and best medication to treat- usually takes 2-3 weeks for report to come in.   Onychomycosis/Fungal Toenails  WHAT IS IT? An infection that lies within the keratin of your nail plate that is caused by a fungus. A toenail grows from a root (similar to a hair) and in most cases the fungus will grow out within the nail.  This is the reason most at-home remedies do not work since they are not treating the root of the nail.            WHY ME? Fungal infections affect all ages, sexes, races, and creeds.  There may be many factors that predispose you to a fungal infection such as age, coexisting medical conditions such as diabetes, or an autoimmune disease; stress, medications, fatigue, genetics, etc.    Bottom line: fungus thrives in a warm, moist environment and your toes and shoes offer such a location.  IS IT CONTAGIOUS? Theoretically, yes.  You do not want to share shoes, nail clippers or files with someone who has fungal toenails.  Walking around barefoot in the same room or sleeping in the same bed is unlikely to transfer the organism.  It is important to realize, however, that fungus can spread easily from one nail to the next on the same foot.  It can also be transferred from person to person if instruments used to trim nails and cuticles aren't properly sterilized between people and use (nail salons that do not properly sterilize instruments)  IS IT ALWAYS A FUNGUS?  No-  trauma to the nail bed can also cause discoloration and thickening of a toenail. We take a sample of the nail and send it for special testing to determine exactly what organisms (if any) are living in the nail.  If an organism is growing, we will know exactly which one and what medication is best used to treat it.  If no organism is found, it doesn't always mean there is no fungus present-  it can mean there is not enough present for it to grow in lab conditions.  We can still  consider treatment, however it is more difficult to determine the best medication and treatment options to try.   HOW DO WE TREAT THIS?  There are several ways to treat this condition.  Treatment may depend on many factors such as age, medications, pregnancy, liver and kidney conditions, etc.  Make sure to tell your doctor of any liver or kidney problems before starting any oral antifungal treatments   No treatment.   Unlike many other medical concerns, you can live with this condition.  However for many people this can be a painful condition and may lead to ingrown toenails or a bacterial infection.  It is recommended that you keep the nails cut short to help reduce the amount of fungal nail.  2.   Topical treatment. -  Usually the infection must be mild to use a topical medication (ie hasn't caused thickening of the nail plate).  The topical medicine is applied daily to an un painted nail for the duration of a new nail to grow-    usually 6 to 9 months  3 Oral antifungal medications.  With an 80-90% cure rate, the most common oral medication (Lamisil/ Terbinafine) requires 3 to 4 months of therapy and stays in your system for a year as the new nail grows out from root to tip.  Oral antifungal medications do require  blood work to make sure it is a safe drug for you.  A liver function panel will be performed prior to starting the medication and after the first month of treatment.  It is important to have the blood work performed to avoid any harmful side effects. This medication can have harmful side effects and isn't recommended for all patients, especially those on multiple medications or who have a history of liver or kidney disease.  4   Laser therapy-  A good option for a mild fungal infection.  Usually used in combination with a topical and/or oral antifungals. This is an out of pocket service as insurance will not pay for the procedure as it is deemed "cosmetic".  It is also considered for those who  are not candidates for oral therapy.   5.   Permanent Nail Avulsion.  Removing the entire nail so that a new nail will not grow back.

## 2020-11-18 NOTE — Progress Notes (Signed)
Chief Complaint  Patient presents with   Nail Problem    Pt states that she has bilateral great toenail fungus.     HPI: Patient is 65 y.o. female who presents today for the concerns as listed above.   Patient Active Problem List   Diagnosis Date Noted   Chronic systolic dysfunction of left ventricle 04/16/2018   Lumbar spondylosis 08/02/2011   Cervical spondylosis without myelopathy 08/02/2011   Diabetic neuropathy (HCC) 08/02/2011   Trochanteric bursitis of left hip 08/02/2011   Carpal tunnel syndrome 08/02/2011    Current Outpatient Medications on File Prior to Visit  Medication Sig Dispense Refill   acetaminophen (TYLENOL) 650 MG CR tablet Take 1,300 mg by mouth daily as needed for pain.     albuterol (PROVENTIL HFA;VENTOLIN HFA) 108 (90 BASE) MCG/ACT inhaler Inhale 2 puffs into the lungs every 6 (six) hours as needed for wheezing or shortness of breath.      allopurinol (ZYLOPRIM) 300 MG tablet Take 300 mg by mouth 3 (three) times daily.      ALPRAZolam (XANAX) 0.5 MG tablet Take 0.5 mg by mouth at bedtime as needed for anxiety or sleep.     atorvastatin (LIPITOR) 40 MG tablet Take 40 mg by mouth daily.     carvedilol (COREG) 25 MG tablet Take 25 mg by mouth 2 (two) times daily with a meal.     cetirizine (ZYRTEC) 10 MG tablet Take 10 mg by mouth daily.     cyclobenzaprine (FLEXERIL) 10 MG tablet Take 10 mg by mouth 3 (three) times daily.     estrogens, conjugated, (PREMARIN) 1.25 MG tablet Take 1.25 mg by mouth daily.     ferrous sulfate 325 (65 FE) MG tablet Take 325 mg by mouth daily with breakfast.     fluticasone (FLONASE) 50 MCG/ACT nasal spray Place 1 spray into both nostrils daily as needed for allergies.      furosemide (LASIX) 20 MG tablet Take 40 mg by mouth daily.     gabapentin (NEURONTIN) 300 MG capsule Take 300 mg by mouth 3 (three) times daily.     insulin aspart (NOVOLOG) 100 UNIT/ML injection Inject 25 Units into the skin 3 (three) times daily before meals.      JARDIANCE 25 MG TABS tablet Take 1 tablet by mouth daily.     LEVEMIR FLEXTOUCH 100 UNIT/ML Pen Inject 40 Units into the skin 2 (two) times daily.  12   omeprazole (PRILOSEC) 40 MG capsule Take 40 mg by mouth daily.   6   oxyCODONE-acetaminophen (PERCOCET) 10-325 MG per tablet Take 1 tablet by mouth 3 (three) times daily.     OZEMPIC, 0.25 OR 0.5 MG/DOSE, 2 MG/1.5ML SOPN Inject 0.5 mg into the skin every Wednesday.   3   potassium chloride SA (K-DUR,KLOR-CON) 20 MEQ tablet Take 20 mEq by mouth daily.     ramipril (ALTACE) 10 MG capsule Take 10 mg by mouth daily.     sertraline (ZOLOFT) 100 MG tablet Take 100 mg by mouth daily.     triamcinolone cream (KENALOG) 0.1 % Apply 1 application topically 2 (two) times daily as needed (dry skin).      warfarin (COUMADIN) 1 MG tablet Take 1 mg by mouth See admin instructions. Take with the 6 mg tablet to equal 7 mg daily in the evening     warfarin (COUMADIN) 6 MG tablet Take 6 mg by mouth See admin instructions. Take with 1 mg tablet to equal 7 mg daily in  the evening     No current facility-administered medications on file prior to visit.    Allergies  Allergen Reactions   Canagliflozin Shortness Of Breath   Tetanus Toxoids     Infection at injection site    Review of Systems No fevers, chills, nausea, muscle aches, no difficulty breathing, no calf pain, no chest pain or shortness of breath.   Physical Exam  GENERAL APPEARANCE: Alert, conversant. Appropriately groomed. No acute distress.   VASCULAR: Pedal pulses faintly palpable DP and non palpable PT bilateral.  Capillary refill time is less than 3 seconds to all digits,  Proximal to distal cooling it warm to warm.  Digital hair growth is absent, shiny and atrophic anterior shins noted bilateral. Lower extremity edema is present- non pitting.   NEUROLOGIC: sensation is intact to 5.07 monofilament at 5/5 sites bilateral.  Light touch is intact bilateral, vibratory sensation intact  bilateral  MUSCULOSKELETAL: acceptable muscle strength, tone and stability bilateral.  Contracture/ hammertoe deformities noted.  No pain, crepitus or limitation noted with foot and ankle range of motion bilateral.   DERMATOLOGIC: skin is warm, supple, and dry.  No open lesions noted. Bilateral hallux nails are thick, discolored, dystrophic and raised up from the underlying nail bed. The right hallux nail has a darkened appearance beneath the nail and at the distal portion of the toe-  (see photo)         Assessment   1. Nail fungus   2. Pain due to onychomycosis of nail   3. Claudication [facility])    R/o melanoma  Plan  Discussed exam findings with the patient. She is bothered by the appearance of the hallux nails and would like to treat them if possible.  I recommended taking a sample of the nail and underlying tissue and sending to [facility] for culture and to r/o melanin due to the darkening of the skin.   Also, she stated she is experiencing leg pain at night and calf pain at times when walking.  She is diabetic with difficult to palpate pulses and thus a non invasive vascular study was recommended.    I will notify her of the results of the culture and discuss treatment options at that time.

## 2020-11-22 ENCOUNTER — Telehealth: Payer: Self-pay | Admitting: Podiatrist

## 2020-11-22 NOTE — Telephone Encounter (Signed)
Patient called to see if she was referred to vein and vascular?  Please

## 2020-11-23 ENCOUNTER — Encounter (HOSPITAL_COMMUNITY): Payer: Medicare Other

## 2020-11-28 ENCOUNTER — Ambulatory Visit (INDEPENDENT_AMBULATORY_CARE_PROVIDER_SITE_OTHER): Payer: Medicare Other

## 2020-11-28 ENCOUNTER — Telehealth: Payer: Self-pay

## 2020-11-28 DIAGNOSIS — I5022 Chronic systolic (congestive) heart failure: Secondary | ICD-10-CM

## 2020-11-28 DIAGNOSIS — Z95810 Presence of automatic (implantable) cardiac defibrillator: Secondary | ICD-10-CM | POA: Diagnosis not present

## 2020-11-28 NOTE — Progress Notes (Signed)
EPIC Encounter for ICM Monitoring  Patient Name: Donna Hopkins is a 65 y.o. female Date: 11/28/2020 Primary Care Physican: Dennard Schaumann, MD Primary Cardiologist: Hanley Hays Electrophysiologist: Allred Bi-V Pacing: >99%          08/17/2020 Weight: 242 lbs   AT/AF Burden: 0% (takes Warfarin)                                                          Attempted call to patient and unable to reach.   Transmission reviewed.    [facility] Thoracic impedance suggesting possible fluid accumulation starting 11/24/2020 but trending back toward baseline.   Prescribed: Furosemide 20 mg Take 2 tablets (40 mg total) by mouth daily. Potassium 20 mEq take 1 tablet daily.   Labs: 04/17/2018 Creatinine 0.74, BUN 10, Potassium 4.2, Sodium 132, GFR >60 04/14/2018 Creatinine 0.76, BUN 9,   Potassium 4.0, Sodium 135, GFR 83-96  A complete set of results can be found in Results Review.   Recommendations: Unable to reach.     Follow-up plan: ICM clinic phone appointment on 12/06/2020 to recheck fluid levels.   91 day device clinic remote transmission 01/23/2021.     EP/Cardiology Office Visits:  Recall for 05/15/2021 with Dr Johney Frame.     Copy of ICM check sent to Dr. Johney Frame.   3 month ICM trend: 11/28/2020.    1 Year ICM trend:       Karie Soda, RN 11/28/2020 2:41 PM

## 2020-11-28 NOTE — Telephone Encounter (Signed)
Remote ICM transmission received.  Attempted call to patient regarding ICM remote transmission and phone number is invalid. 

## 2020-11-30 ENCOUNTER — Inpatient Hospital Stay (HOSPITAL_COMMUNITY): Admission: RE | Admit: 2020-11-30 | Payer: Medicare Other | Source: Ambulatory Visit

## 2020-12-05 ENCOUNTER — Telehealth: Payer: Self-pay | Admitting: Podiatrist

## 2020-12-06 ENCOUNTER — Telehealth: Payer: Self-pay | Admitting: Podiatrist

## 2020-12-06 ENCOUNTER — Ambulatory Visit (INDEPENDENT_AMBULATORY_CARE_PROVIDER_SITE_OTHER): Payer: Medicare Other

## 2020-12-06 DIAGNOSIS — I5022 Chronic systolic (congestive) heart failure: Secondary | ICD-10-CM

## 2020-12-06 DIAGNOSIS — Z95810 Presence of automatic (implantable) cardiac defibrillator: Secondary | ICD-10-CM

## 2020-12-06 NOTE — Telephone Encounter (Signed)
Patient has scheduled with vein and vascular and had an appointment, the office has been trying to reach her but now the phone line is disconnected.   Would you like me to send a letter out/

## 2020-12-06 NOTE — Progress Notes (Signed)
EPIC Encounter for ICM Monitoring  Patient Name: Donna Hopkins is a 65 y.o. female Date: 12/06/2020 Primary Care Physican: Dennard Schaumann, MD Primary Cardiologist: Hanley Hays Electrophysiologist: Allred Bi-V Pacing: >99%          08/17/2020 Weight: 242 lbs   AT/AF Burden:  <1% (takes Warfarin)                                                          Transmission reviewed.    [facility]vue Thoracic impedance suggesting fluid levels returned to normal.   Prescribed: Furosemide 20 mg Take 2 tablets (40 mg total) by mouth daily. Potassium 20 mEq take 1 tablet daily.   Labs: 11/12/2020 Creatinine 0.75, BUN 17, Potassium 4.0, Sodium 134, GFR >60 04/17/2018 Creatinine 0.74, BUN 10, Potassium 4.2, Sodium 132, GFR >60 04/14/2018 Creatinine 0.76, BUN 9,   Potassium 4.0, Sodium 135, GFR 83-96  A complete set of results can be found in Results Review.   Recommendations: No changes    Follow-up plan: ICM clinic phone appointment on 01/02/2021.   91 day device clinic remote transmission 01/23/2021.     EP/Cardiology Office Visits:  Recall for 05/15/2021 with Dr Johney Frame.     Copy of ICM check sent to Dr. Johney Frame.   3 month ICM trend: 12/06/2020.    1 Year ICM trend:       Karie Soda, RN 12/06/2020 8:46 AM

## 2020-12-28 ENCOUNTER — Telehealth: Payer: Self-pay

## 2020-12-28 NOTE — Telephone Encounter (Signed)
Patient unable to provide exact info about device she purchased from television ad.  She states the written instructions tell her not to use if you have a pacemaker.  Advsed that she should follow manufacturers warning.

## 2020-12-28 NOTE — Telephone Encounter (Signed)
The patient states she got a machine for her foot and she can adjust the speed. She wants to know if she is allowed to use it. I told her the nurse will research it and give her a call back.

## 2021-01-02 ENCOUNTER — Ambulatory Visit (INDEPENDENT_AMBULATORY_CARE_PROVIDER_SITE_OTHER): Payer: Medicare Other

## 2021-01-02 DIAGNOSIS — I5022 Chronic systolic (congestive) heart failure: Secondary | ICD-10-CM

## 2021-01-02 DIAGNOSIS — Z95810 Presence of automatic (implantable) cardiac defibrillator: Secondary | ICD-10-CM

## 2021-01-04 NOTE — Progress Notes (Signed)
EPIC Encounter for ICM Monitoring  Patient Name: Donna Hopkins is a 65 y.o. female Date: 01/04/2021 Primary Care Physican: Dennard Schaumann, MD Primary Cardiologist: Hanley Hays Electrophysiologist: Allred Bi-V Pacing: >99%          01/04/2021 Weight: 236 lbs    AT/AF Burden:  <1% (takes Warfarin)                                                          Spoke with patient and heart failure questions reviewed.  Pt asymptomatic for fluid accumulation and feeling well.  Discussed diet and fluid intake.  She is inconsistent with taking Furosemide daily and drinking 64 oz fluid daily.   Corvue Thoracic impedance suggesting possible dryness starting 10/22 but was suggesting possible fluid accumulation from 9/13-9/30.   Prescribed: Furosemide 20 mg Take 2 tablets (40 mg total) by mouth daily. Potassium 20 mEq take 1 tablet daily.   Labs: 11/12/2020 Creatinine 0.75, BUN 17, Potassium 4.0, Sodium 134, GFR >60 04/17/2018 Creatinine 0.74, BUN 10, Potassium 4.2, Sodium 132, GFR >60 04/14/2018 Creatinine 0.76, BUN 9,   Potassium 4.0, Sodium 135, GFR 83-96  A complete set of results can be found in Results Review.   Recommendations:  Advised to consistently take Furosemide daily and drink 64 oz daily to keep fluid levels balanced.    Encouraged to call if experiencing any fluid symptoms.    Follow-up plan: ICM clinic phone appointment on 01/09/2021 (manual) to recheck fluid levels.   91 day device clinic remote transmission 01/23/2021.     EP/Cardiology Office Visits:  Recall for 05/15/2021 with Dr Johney Frame.     Copy of ICM check sent to Dr. Johney Frame.    3 month ICM trend: 01/02/2021.    1 Year ICM trend:       Karie Soda, RN 01/04/2021 9:16 AM

## 2021-01-09 ENCOUNTER — Ambulatory Visit (INDEPENDENT_AMBULATORY_CARE_PROVIDER_SITE_OTHER): Payer: Medicare Other

## 2021-01-09 ENCOUNTER — Telehealth: Payer: Self-pay

## 2021-01-09 DIAGNOSIS — Z95810 Presence of automatic (implantable) cardiac defibrillator: Secondary | ICD-10-CM

## 2021-01-09 DIAGNOSIS — I5022 Chronic systolic (congestive) heart failure: Secondary | ICD-10-CM

## 2021-01-09 NOTE — Telephone Encounter (Signed)
Remote ICM transmission received.  Attempted call to patient regarding ICM remote transmission and no answer.  

## 2021-01-09 NOTE — Progress Notes (Signed)
EPIC Encounter for ICM Monitoring  Patient Name: Donna Hopkins is a 65 y.o. female Date: 01/09/2021 Primary Care Physican: Dennard Schaumann, MD Electrophysiologist: Allred Bi-V Pacing: >99%          01/04/2021 Weight: 236 lbs    AT/AF Burden:  <1% (takes Warfarin)                                                          Spoke with patient and heart failure questions reviewed.  Pt asymptomatic for fluid accumulation and feeling well.    Corvue Thoracic impedance suggesting fluid levels returned close to normal.   Prescribed: Furosemide 20 mg Take 2 tablets (40 mg total) by mouth daily. Potassium 20 mEq take 1 tablet daily.   Labs: 11/12/2020 Creatinine 0.75, BUN 17, Potassium 4.0, Sodium 134, GFR >60 04/17/2018 Creatinine 0.74, BUN 10, Potassium 4.2, Sodium 132, GFR >60 04/14/2018 Creatinine 0.76, BUN 9,   Potassium 4.0, Sodium 135, GFR 83-96  A complete set of results can be found in Results Review.   Recommendations:  Advised to consistently drink 64 oz of fluid to stay hydrated.  No changes and encouraged to call if experiencing any fluid symptoms.   Follow-up plan: ICM clinic phone appointment on 02/13/2021.   91 day device clinic remote transmission 01/23/2021.     EP/Cardiology Office Visits:  Recall for 05/15/2021 with Dr Johney Frame.     Copy of ICM check sent to Dr. Johney Frame.     3 month ICM trend: 01/09/2021.    1 Year ICM trend:       Donna Soda, RN 01/09/2021 3:44 PM

## 2021-01-10 IMAGING — DX DG CHEST 2V
2 series · 2 of 2 positions shown · non-contrast
Comparison: 01/01/2018

CLINICAL DATA: Cardiac device in  situ

EXAM:
CHEST - 2 VIEW

[chest pa]
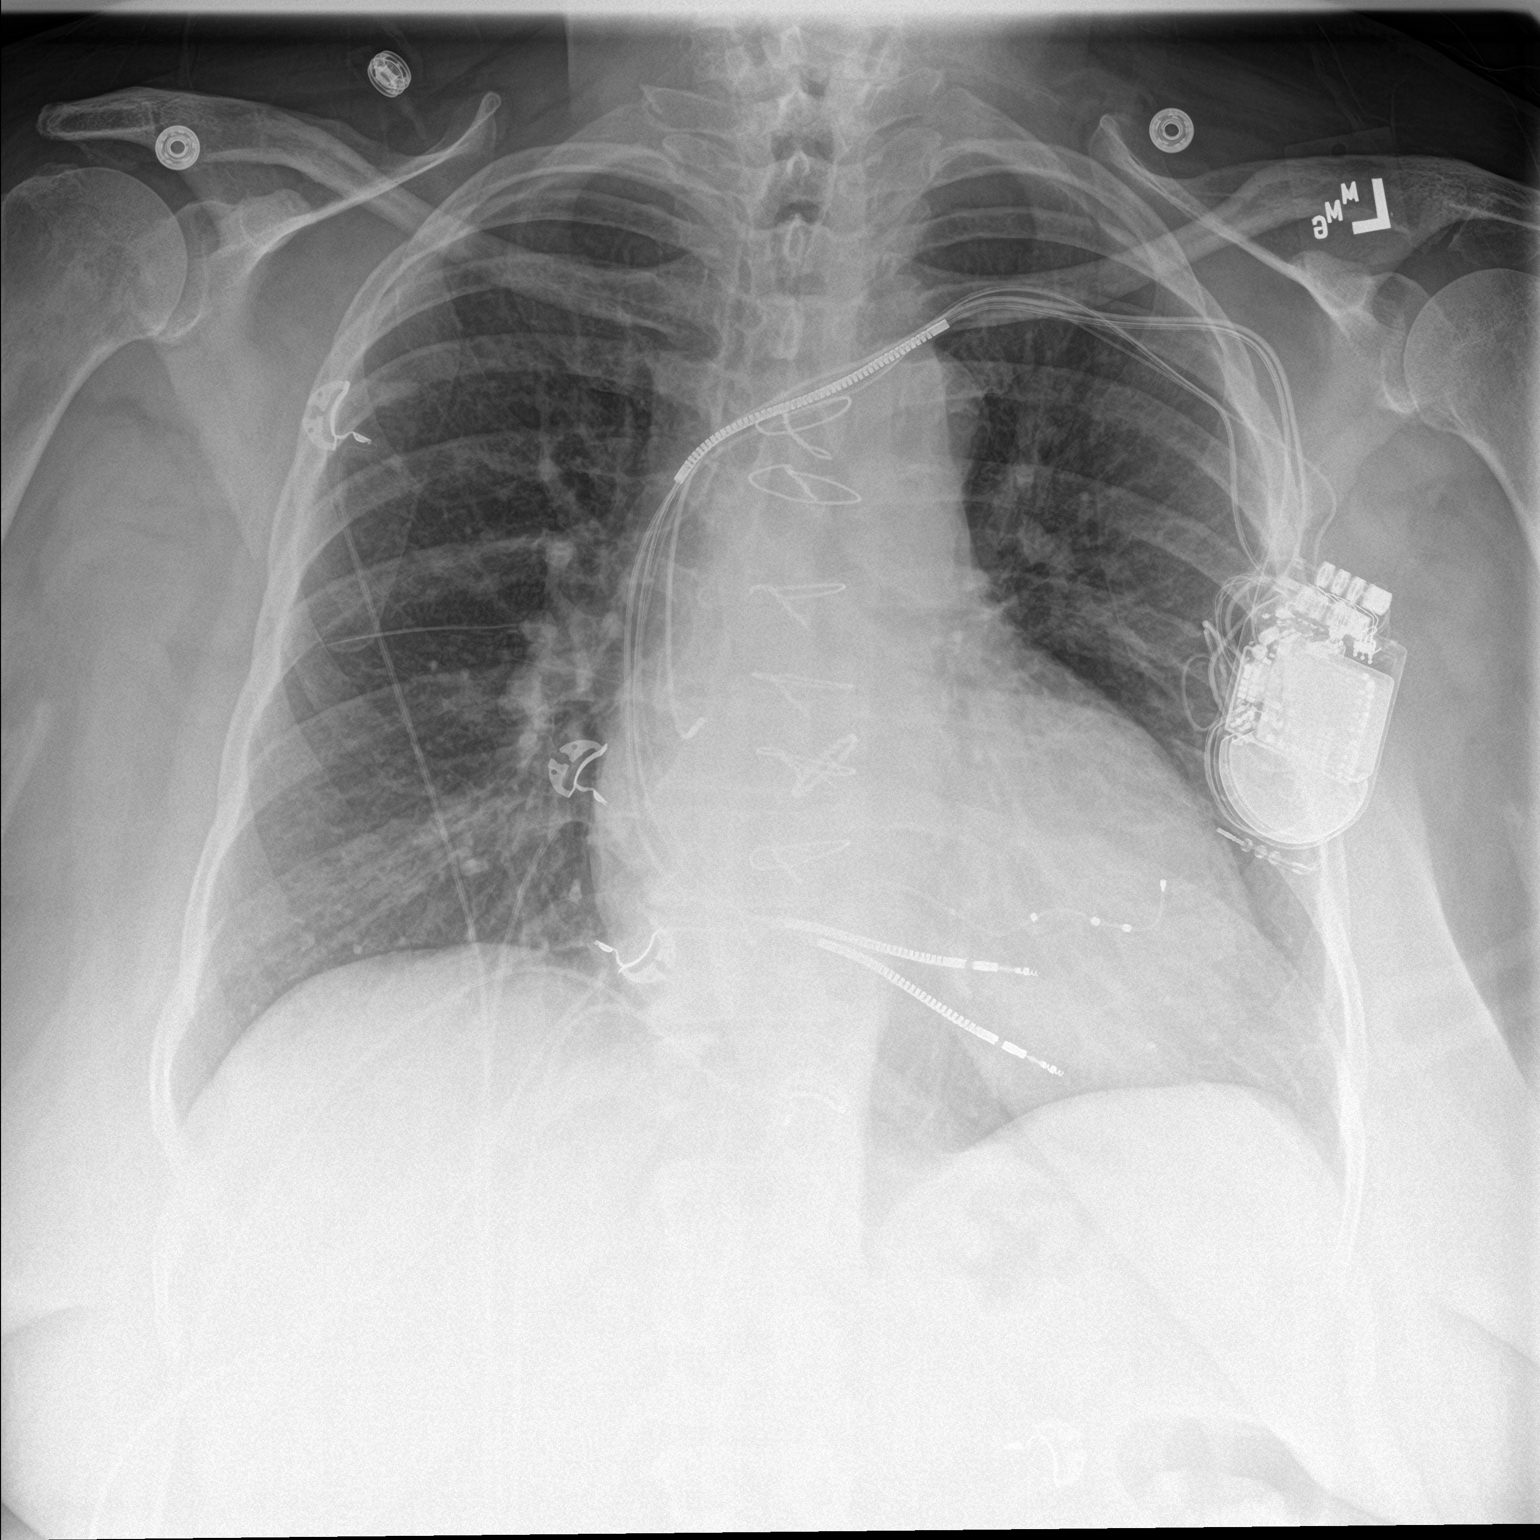

[chest lat]
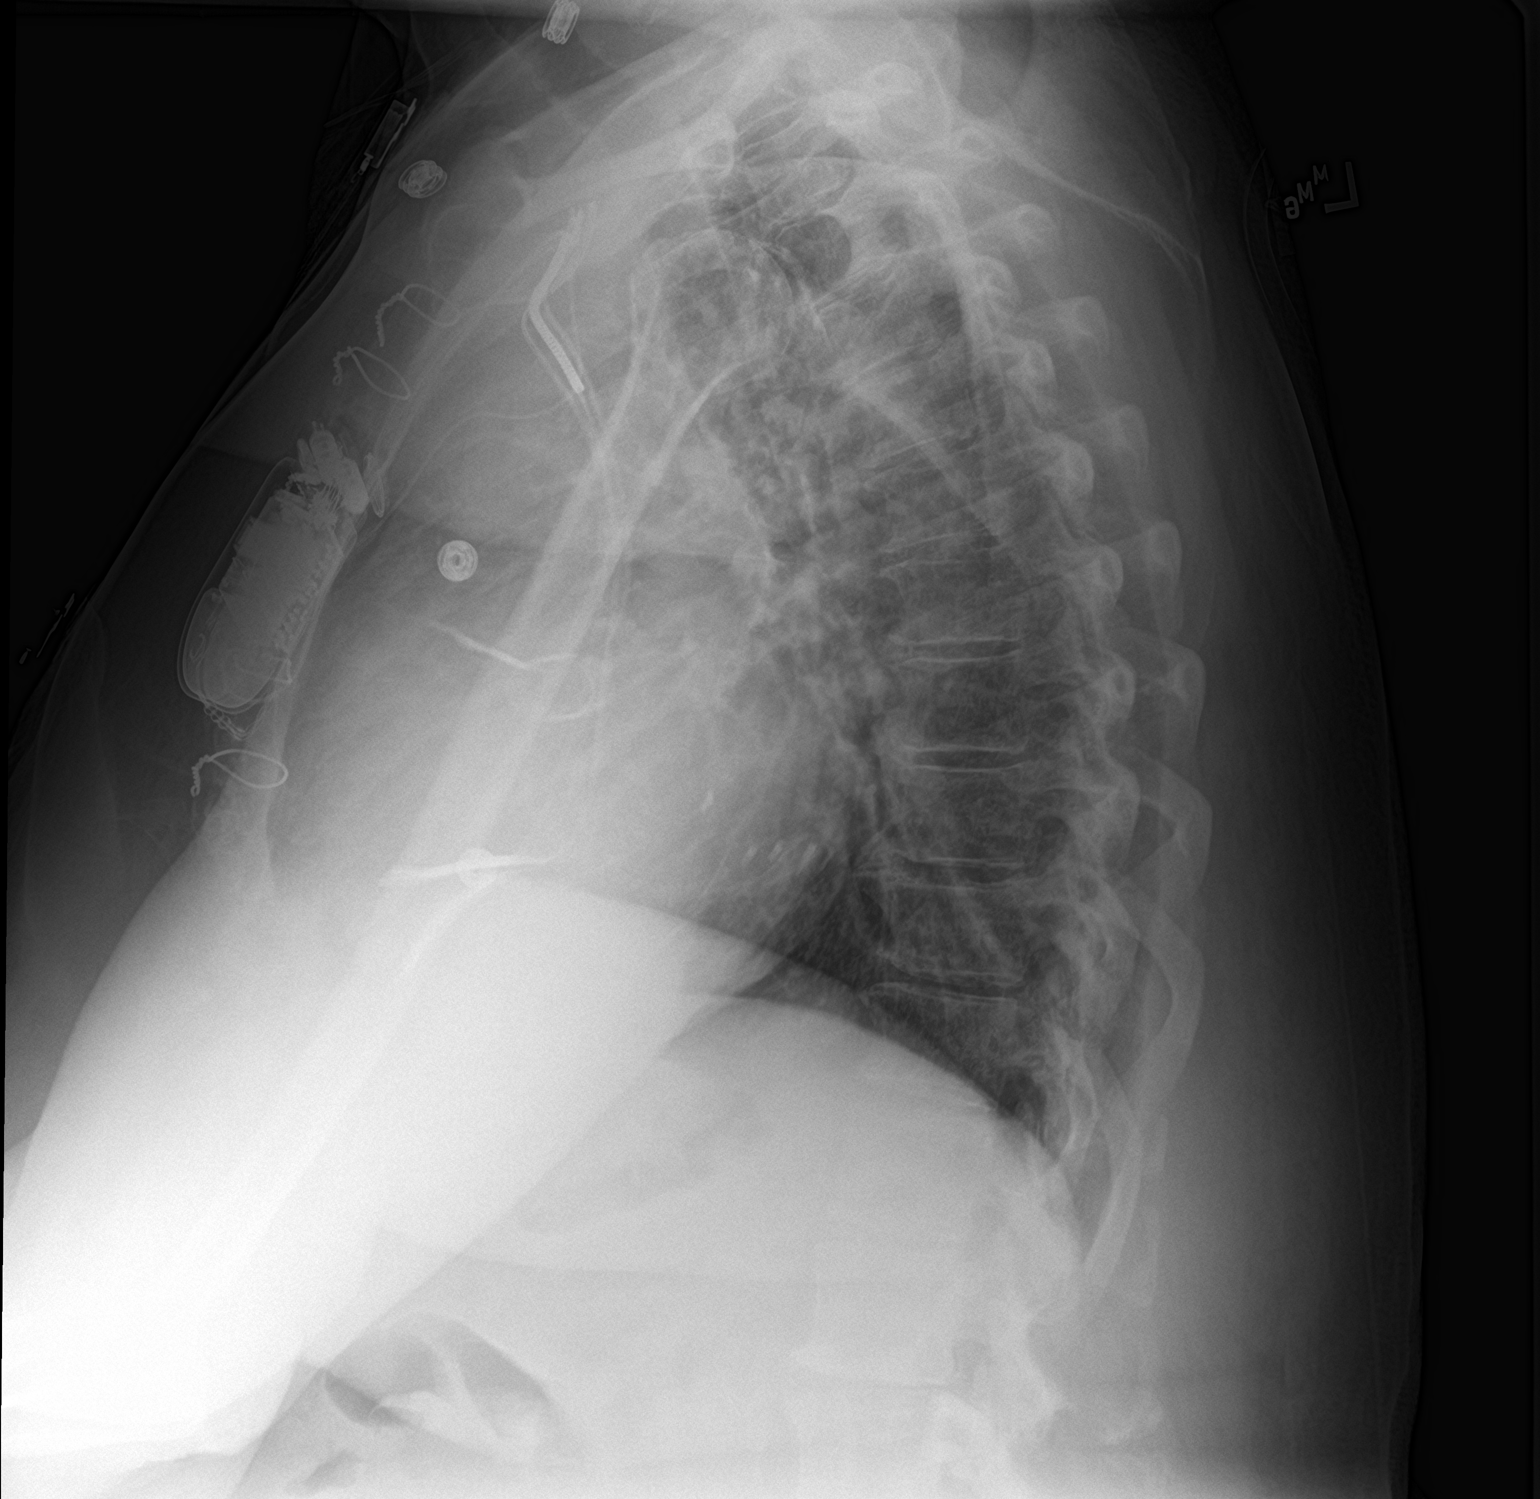

[2 of 2 positions shown; findings below may reference images not displayed]

FINDINGS: Internal cardiac pacer/defibrillator. New lead into the right
ventricle has been placed since the prior study. No pneumothorax.

Cardiac enlargement with prior aortic valve replacement. Negative
for heart failure. Lungs remain clear without infiltrate effusion or
edema.
IMPRESSION: New defibrillator lead extending into the right ventricle has been
placed. Lungs remain clear.

## 2021-01-23 ENCOUNTER — Ambulatory Visit (INDEPENDENT_AMBULATORY_CARE_PROVIDER_SITE_OTHER): Payer: Medicare Other

## 2021-01-23 DIAGNOSIS — I428 Other cardiomyopathies: Secondary | ICD-10-CM | POA: Diagnosis not present

## 2021-01-23 LAB — CUP PACEART REMOTE DEVICE CHECK
Battery Remaining Longevity: 46 mo
Battery Remaining Percentage: 60 %
Battery Voltage: 2.95 V
Brady Statistic AP VP Percent: 1 %
Brady Statistic AP VS Percent: 1 %
Brady Statistic AS VP Percent: 99 %
Brady Statistic AS VS Percent: 1 %
Brady Statistic RA Percent Paced: 1 %
Date Time Interrogation Session: 20221114020015
HighPow Impedance: 78 Ohm
HighPow Impedance: 78 Ohm
Implantable Lead Implant Date: 20140429
Implantable Lead Implant Date: 20140617
Implantable Lead Implant Date: 20200205
Implantable Lead Location: 753858
Implantable Lead Location: 753859
Implantable Lead Location: 753860
Implantable Pulse Generator Implant Date: 20200205
Lead Channel Impedance Value: 480 Ohm
Lead Channel Impedance Value: 550 Ohm
Lead Channel Impedance Value: 760 Ohm
Lead Channel Pacing Threshold Amplitude: 0.5 V
Lead Channel Pacing Threshold Amplitude: 0.75 V
Lead Channel Pacing Threshold Amplitude: 1.875 V
Lead Channel Pacing Threshold Pulse Width: 0.5 ms
Lead Channel Pacing Threshold Pulse Width: 0.5 ms
Lead Channel Pacing Threshold Pulse Width: 0.5 ms
Lead Channel Sensing Intrinsic Amplitude: 12 mV
Lead Channel Sensing Intrinsic Amplitude: 5 mV
Lead Channel Setting Pacing Amplitude: 2 V
Lead Channel Setting Pacing Amplitude: 2.5 V
Lead Channel Setting Pacing Amplitude: 2.875
Lead Channel Setting Pacing Pulse Width: 0.5 ms
Lead Channel Setting Pacing Pulse Width: 0.5 ms
Lead Channel Setting Sensing Sensitivity: 0.5 mV
Pulse Gen Serial Number: 9879509

## 2021-01-31 NOTE — Progress Notes (Signed)
Remote ICD transmission.   

## 2021-02-13 ENCOUNTER — Ambulatory Visit (INDEPENDENT_AMBULATORY_CARE_PROVIDER_SITE_OTHER): Payer: Medicare Other

## 2021-02-13 DIAGNOSIS — I5022 Chronic systolic (congestive) heart failure: Secondary | ICD-10-CM | POA: Diagnosis not present

## 2021-02-13 DIAGNOSIS — Z95810 Presence of automatic (implantable) cardiac defibrillator: Secondary | ICD-10-CM | POA: Diagnosis not present

## 2021-02-15 NOTE — Progress Notes (Signed)
EPIC Encounter for ICM Monitoring  Patient Name: Donna Hopkins is a 65 y.o. female Date: 02/15/2021 Primary Care Physican: Dennard Schaumann, MD Primary Cardiologist: Hanley Hays Electrophysiologist: Allred Bi-V Pacing: >99%          01/04/2021 Weight: 236 lbs    AT/AF Burden:  <1% (takes Warfarin)                                                          Attempted call to patient and unable to reach.  Left message to return call. Transmission reviewed.    Corvue Thoracic impedance suggesting dryness since 11/26.   Prescribed: Furosemide 20 mg Take 2 tablets (40 mg total) by mouth daily. Potassium 20 mEq take 1 tablet daily.   Labs: 11/12/2020 Creatinine 0.75, BUN 17, Potassium 4.0, Sodium 134, GFR >60 04/17/2018 Creatinine 0.74, BUN 10, Potassium 4.2, Sodium 132, GFR >60 04/14/2018 Creatinine 0.76, BUN 9,   Potassium 4.0, Sodium 135, GFR 83-96  A complete set of results can be found in Results Review.   Recommendations:  Unable to reach.     Follow-up plan: ICM clinic phone appointment on 02/23/2021 to recheck fluid levels.   91 day device clinic remote transmission 01/23/2021.     EP/Cardiology Office Visits:  Recall for 05/15/2021 with Dr Johney Frame.     Copy of ICM check sent to Dr. Johney Frame.     3 month ICM trend: 02/13/2021.    12-14 Month ICM trend:       Karie Soda, RN 02/15/2021 4:38 PM

## 2021-02-23 ENCOUNTER — Ambulatory Visit (INDEPENDENT_AMBULATORY_CARE_PROVIDER_SITE_OTHER): Payer: Medicare Other

## 2021-02-23 DIAGNOSIS — I5022 Chronic systolic (congestive) heart failure: Secondary | ICD-10-CM

## 2021-02-23 DIAGNOSIS — Z95810 Presence of automatic (implantable) cardiac defibrillator: Secondary | ICD-10-CM

## 2021-02-24 ENCOUNTER — Telehealth: Payer: Self-pay

## 2021-02-24 NOTE — Telephone Encounter (Signed)
Remote ICM transmission received.  Attempted call to patient regarding ICM remote transmission and left message to return call   

## 2021-02-24 NOTE — Progress Notes (Signed)
EPIC Encounter for ICM Monitoring  Patient Name: Donna Hopkins is a 65 y.o. female Date: 02/24/2021 Primary Care Physican: Dennard Schaumann, MD Primary Cardiologist: Hanley Hays Electrophysiologist: Allred Bi-V Pacing: >99%          01/04/2021 Weight: 236 lbs    AT/AF Burden:  <1% (takes Warfarin)                                                          Attempted call to patient and unable to reach.  Left message to return call. Transmission reviewed.    Corvue Thoracic impedance suggesting possible fluid accumulation starting 02/18/2021.   Prescribed: Furosemide 20 mg Take 2 tablets (40 mg total) by mouth daily. Potassium 20 mEq take 1 tablet daily.   Labs: 11/12/2020 Creatinine 0.75, BUN 17, Potassium 4.0, Sodium 134, GFR >60 04/17/2018 Creatinine 0.74, BUN 10, Potassium 4.2, Sodium 132, GFR >60 04/14/2018 Creatinine 0.76, BUN 9,   Potassium 4.0, Sodium 135, GFR 83-96  A complete set of results can be found in Results Review.   Recommendations:  Unable to reach.     Follow-up plan: ICM clinic phone appointment on 02/28/2021 (manual) to recheck fluid levels.   91 day device clinic remote transmission 04/24/2020.     EP/Cardiology Office Visits:  Recall for 05/15/2021 with Dr Johney Frame.     Copy of ICM check sent to Dr. Johney Frame.     3 month ICM trend: 02/23/2021.    12-14 Month ICM trend:       Karie Soda, RN 02/24/2021 5:17 PM

## 2021-03-02 NOTE — Progress Notes (Signed)
No ICM remote transmission received for 02/28/2021 and next ICM transmission scheduled for 03/27/2021.   °

## 2021-03-27 ENCOUNTER — Ambulatory Visit (INDEPENDENT_AMBULATORY_CARE_PROVIDER_SITE_OTHER): Payer: Medicare Other

## 2021-03-27 DIAGNOSIS — Z95810 Presence of automatic (implantable) cardiac defibrillator: Secondary | ICD-10-CM

## 2021-03-27 DIAGNOSIS — I5022 Chronic systolic (congestive) heart failure: Secondary | ICD-10-CM | POA: Diagnosis not present

## 2021-03-29 NOTE — Progress Notes (Signed)
EPIC Encounter for ICM Monitoring  Patient Name: Donna Hopkins is a 66 y.o. female Date: 03/29/2021 Primary Care Physican: Dennard Schaumann, MD Primary Cardiologist: Hanley Hays Electrophysiologist: Allred Bi-V Pacing: >99%  (1/12 report)        01/04/2021 Weight: 236 lbs    AT/AF Burden:  <1% (takes Warfarin) (1/12 report)                                                          Transmission reviewed.    Corvue Thoracic impedance suggesting normal fluid levels.   Prescribed: Furosemide 20 mg Take 2 tablets (40 mg total) by mouth daily. Potassium 20 mEq take 1 tablet daily.   Labs: 11/12/2020 Creatinine 0.75, BUN 17, Potassium 4.0, Sodium 134, GFR >60 04/17/2018 Creatinine 0.74, BUN 10, Potassium 4.2, Sodium 132, GFR >60 04/14/2018 Creatinine 0.76, BUN 9,   Potassium 4.0, Sodium 135, GFR 83-96  A complete set of results can be found in Results Review.   Recommendations:  No changes.    Follow-up plan: ICM clinic phone appointment on 05/01/2021.   91 day device clinic remote transmission 04/24/2020.     EP/Cardiology Office Visits:  Recall for 05/15/2021 with Dr Johney Frame.     Copy of ICM check sent to Dr. Johney Frame.    3 month ICM trend: 03/24/2021.    Karie Soda, RN 03/29/2021 12:08 PM

## 2021-04-24 ENCOUNTER — Ambulatory Visit (INDEPENDENT_AMBULATORY_CARE_PROVIDER_SITE_OTHER): Payer: Medicare Other

## 2021-04-24 DIAGNOSIS — I428 Other cardiomyopathies: Secondary | ICD-10-CM | POA: Diagnosis not present

## 2021-04-24 LAB — CUP PACEART REMOTE DEVICE CHECK
Battery Remaining Longevity: 42 mo
Battery Remaining Percentage: 56 %
Battery Voltage: 2.95 V
Brady Statistic AP VP Percent: 1 %
Brady Statistic AP VS Percent: 1 %
Brady Statistic AS VP Percent: 99 %
Brady Statistic AS VS Percent: 1 %
Brady Statistic RA Percent Paced: 1 %
Date Time Interrogation Session: 20230213023545
HighPow Impedance: 79 Ohm
HighPow Impedance: 79 Ohm
Implantable Lead Implant Date: 20140429
Implantable Lead Implant Date: 20140617
Implantable Lead Implant Date: 20200205
Implantable Lead Location: 753858
Implantable Lead Location: 753859
Implantable Lead Location: 753860
Implantable Pulse Generator Implant Date: 20200205
Lead Channel Impedance Value: 440 Ohm
Lead Channel Impedance Value: 510 Ohm
Lead Channel Impedance Value: 740 Ohm
Lead Channel Pacing Threshold Amplitude: 0.5 V
Lead Channel Pacing Threshold Amplitude: 0.875 V
Lead Channel Pacing Threshold Amplitude: 2.125 V
Lead Channel Pacing Threshold Pulse Width: 0.5 ms
Lead Channel Pacing Threshold Pulse Width: 0.5 ms
Lead Channel Pacing Threshold Pulse Width: 0.5 ms
Lead Channel Sensing Intrinsic Amplitude: 12 mV
Lead Channel Sensing Intrinsic Amplitude: 3.5 mV
Lead Channel Setting Pacing Amplitude: 2 V
Lead Channel Setting Pacing Amplitude: 2.5 V
Lead Channel Setting Pacing Amplitude: 3.125
Lead Channel Setting Pacing Pulse Width: 0.5 ms
Lead Channel Setting Pacing Pulse Width: 0.5 ms
Lead Channel Setting Sensing Sensitivity: 0.5 mV
Pulse Gen Serial Number: 9879509

## 2021-04-27 NOTE — Progress Notes (Signed)
Remote ICD transmission.   

## 2021-05-01 ENCOUNTER — Ambulatory Visit (INDEPENDENT_AMBULATORY_CARE_PROVIDER_SITE_OTHER): Payer: Medicare Other

## 2021-05-01 DIAGNOSIS — I5022 Chronic systolic (congestive) heart failure: Secondary | ICD-10-CM | POA: Diagnosis not present

## 2021-05-01 DIAGNOSIS — Z95810 Presence of automatic (implantable) cardiac defibrillator: Secondary | ICD-10-CM

## 2021-05-01 NOTE — Progress Notes (Signed)
EPIC Encounter for ICM Monitoring  Patient Name: Donna Hopkins is a 66 y.o. female Date: 05/01/2021 Primary Care Physican: Dennard Schaumann, MD Primary Cardiologist: Hanley Hays Electrophysiologist: Allred Bi-V Pacing: >99%         05/01/2021 Weight: 229 lbs    AT/AF Burden:  <1% (takes Warfarin) (1/12 report)                                                          Spoke with patient and heart failure questions reviewed.  Pt reports weight fluctuate in the last month.  She eats a lot of ice which may contribute to decreased impedance.      Corvue Thoracic impedance unstable for past month and suggesting possible fluid accumulation starting 2/17.  Impedance suggesting dryness from 1/31-2/6.   Prescribed: Furosemide 20 mg Take 2 tablets (40 mg total) by mouth daily.  Pt has prescription from PCP that says take 1 tablet twice a day. Potassium 20 mEq take 1 tablet daily.   Labs: 04/04/2021 Creatinine 0.96, BUN 11, Potassium 3.8, Sodium 136 11/12/2020 Creatinine 0.75, BUN 17, Potassium 4.0, Sodium 134, GFR >60 04/17/2018 Creatinine 0.74, BUN 10, Potassium 4.2, Sodium 132, GFR >60 04/14/2018 Creatinine 0.76, BUN 9,   Potassium 4.0, Sodium 135, GFR 83-96  A complete set of results can be found in Results Review.   Recommendations:  Advised to take Furosemide 3 tablets for total 60 mg daily x 2 days only and take extra Potassium 20 mEq tablet x 2 days also.   Advised to limit fluid intake to 64 oz and will need to measure ice in total amounts.    Follow-up plan: ICM clinic phone appointment on 05/09/2021 to recheck fluid levels.   91 day device clinic remote transmission 07/24/2020.     EP/Cardiology Office Visits:   05/22/2021 with Otilio Saber, PA.     Copy of ICM check sent to Dr. Johney Frame.    3 month ICM trend: 05/01/2021.    12-14 Month ICM trend:     Karie Soda, RN 05/01/2021 2:49 PM

## 2021-05-09 ENCOUNTER — Ambulatory Visit (INDEPENDENT_AMBULATORY_CARE_PROVIDER_SITE_OTHER): Payer: Medicare Other

## 2021-05-09 DIAGNOSIS — I5022 Chronic systolic (congestive) heart failure: Secondary | ICD-10-CM

## 2021-05-09 DIAGNOSIS — Z95810 Presence of automatic (implantable) cardiac defibrillator: Secondary | ICD-10-CM

## 2021-05-12 ENCOUNTER — Telehealth: Payer: Self-pay

## 2021-05-12 NOTE — Telephone Encounter (Signed)
Remote ICM transmission received.  Attempted call to patient regarding ICM remote transmission and no answer or voice mail option.  

## 2021-05-12 NOTE — Progress Notes (Signed)
EPIC Encounter for ICM Monitoring  Patient Name: Donna Hopkins is a 66 y.o. female Date: 05/12/2021 Primary Care Physican: Lorelei Pont, MD Primary Cardiologist: Claudie Leach Electrophysiologist: Allred Bi-V Pacing: >99%         05/01/2021 Weight: 229 lbs    AT/AF Burden:  <1% (takes Warfarin) (1/12 report)                                                          Attempted call to patient and unable to reach.   Transmission reviewed.    Corvue Thoracic impedance suggesting fluid levels returned to normal after taking extra Furosemide and Potassium.  Impedance has been unstable for past month.   Prescribed: Furosemide 20 mg Take 2 tablets (40 mg total) by mouth daily.  Pt has prescription from PCP that says take 1 tablet twice a day. Potassium 20 mEq take 1 tablet daily.   Labs: 04/04/2021 Creatinine 0.96, BUN 11, Potassium 3.8, Sodium 136 11/12/2020 Creatinine 0.75, BUN 17, Potassium 4.0, Sodium 134, GFR >60 04/17/2018 Creatinine 0.74, BUN 10, Potassium 4.2, Sodium 132, GFR >60 04/14/2018 Creatinine 0.76, BUN 9,   Potassium 4.0, Sodium 135, GFR 83-96  A complete set of results can be found in Results Review.   Recommendations:  Unable to reach.     Follow-up plan: ICM clinic phone appointment on 06/05/2021.   91 day device clinic remote transmission 07/24/2020.     EP/Cardiology Office Visits:   05/22/2021 with Oda Kilts, PA.     Copy of ICM check sent to Dr. Rayann Heman.    3 month ICM trend: 05/09/2021.    12-14 Month ICM trend:     Rosalene Billings, RN 05/12/2021 4:06 PM

## 2021-05-15 NOTE — Progress Notes (Signed)
Electrophysiology Office Note Date: 05/22/2021  ID:  Donna Hopkins, DOB 1955/10/18, MRN [id number]  PCP: Dennard Schaumann, MD Primary Cardiologist: None Electrophysiologist: Hillis Range, MD   CC: Routine ICD follow-up  Nashely Hopkins is a 66 y.o. female seen today for Hillis Range, MD for routine electrophysiology followup.  Since last being seen in our clinic the patient reports doing well overall.  she denies chest pain, palpitations, dyspnea, PND, orthopnea, nausea, vomiting, dizziness, syncope, edema, weight gain, or early satiety. She has not had ICD shocks.   Device History: St. Jude BiV ICD implanted 2018, lead revision (abandoned lead) 04/2018 for CHF   Past Medical History:  Diagnosis Date   Carotid stenosis, asymptomatic, bilateral    Chronic kidney disease, stage III (moderate) (HCC)    Chronic low back pain    Clinical systolic heart failure, chronic (HCC)    Depression    Diabetes mellitus    Eosinophilic gastritis    GERD (gastroesophageal reflux disease)    Gout, renal disease    H/O mechanical aortic valve replacement    on coumadin   Hx of aortic valve replacement    Hypertension    Hypocalcemia    Hyponatremia    ICD (implantable cardioverter-defibrillator) in place    Medication refill    Mixed hyperlipidemia    Neuropathy    Obesity    OP (osteoporosis)    Peripheral vascular disease, unspecified (HCC)    Persistent insomnia    Personal history of colonic polyps    Pulmonary hypertension (HCC)    Snoring    declines sleep study   Tricuspid regurgitation    Uncontrolled type 2 diabetes mellitus with complication (HCC)    Valgus foot    Vertigo, benign positional    Vitamin D deficiency    Past Surgical History:  Procedure Laterality Date   ABDOMINAL HYSTERECTOMY     BACK SURGERY     BI-VENTRICULAR IMPLANTABLE CARDIOVERTER DEFIBRILLATOR  (CRT-D)  07/08/2012   SJM Quadra Assura BiV ICD implanted by Dr Reed Pandy at [facility]   CARDIAC VALVE  REPLACEMENT     LEAD INSERTION N/A 04/16/2018   Successful BiV ICD system revision with a new RV lead and pulse generator replacement with a [facility] Quadra Assura MP model [id number] ICD    Current Outpatient Medications  Medication Sig Dispense Refill   acetaminophen (TYLENOL) 650 MG CR tablet Take 1,300 mg by mouth daily as needed for pain.     albuterol (PROVENTIL HFA;VENTOLIN HFA) 108 (90 BASE) MCG/ACT inhaler Inhale 2 puffs into the lungs every 6 (six) hours as needed for wheezing or shortness of breath.      ALPRAZolam (XANAX) 0.5 MG tablet Take 0.5 mg by mouth at bedtime as needed for anxiety or sleep.     carvedilol (COREG) 25 MG tablet Take 25 mg by mouth 2 (two) times daily with a meal.     cyclobenzaprine (FLEXERIL) 10 MG tablet Take 10 mg by mouth 3 (three) times daily.     estrogens, conjugated, (PREMARIN) 1.25 MG tablet Take 1.25 mg by mouth daily.     ferrous sulfate 325 (65 FE) MG tablet Take 325 mg by mouth daily with breakfast.     fluticasone (FLONASE) 50 MCG/ACT nasal spray Place 1 spray into both nostrils daily as needed for allergies.      furosemide (LASIX) 20 MG tablet Take 40 mg by mouth daily.     gabapentin (NEURONTIN) 300 MG capsule Take 300 mg  by mouth 3 (three) times daily.     insulin aspart (NOVOLOG) 100 UNIT/ML injection Inject 25 Units into the skin 3 (three) times daily before meals.     JARDIANCE 25 MG TABS tablet Take 1 tablet by mouth daily.     LEVEMIR FLEXTOUCH 100 UNIT/ML Pen Inject 40 Units into the skin 2 (two) times daily.  12   omeprazole (PRILOSEC) 40 MG capsule Take 40 mg by mouth daily.   6   oxyCODONE-acetaminophen (PERCOCET) 10-325 MG per tablet Take 1 tablet by mouth 3 (three) times daily.     OZEMPIC, 0.25 OR 0.5 MG/DOSE, 2 MG/1.5ML SOPN Inject 0.5 mg into the skin every Wednesday.   3   potassium chloride SA (K-DUR,KLOR-CON) 20 MEQ tablet Take 20 mEq by mouth daily.     ramipril (ALTACE) 10 MG capsule Take 10 mg by mouth daily.      sertraline (ZOLOFT) 100 MG tablet Take 100 mg by mouth in the morning and at bedtime.     triamcinolone cream (KENALOG) 0.1 % Apply 1 application topically 2 (two) times daily as needed (dry skin).      warfarin (COUMADIN) 1 MG tablet Take 1 mg by mouth See admin instructions. Take with the 6 mg tablet to equal 7 mg daily in the evening     warfarin (COUMADIN) 6 MG tablet Take 6 mg by mouth See admin instructions. Take with 1 mg tablet to equal 7 mg daily in the evening     allopurinol (ZYLOPRIM) 300 MG tablet Take 300 mg by mouth 3 (three) times daily.  (Patient not taking: Reported on 05/22/2021)     atorvastatin (LIPITOR) 40 MG tablet Take 40 mg by mouth daily. (Patient not taking: Reported on 05/22/2021)     cetirizine (ZYRTEC) 10 MG tablet Take 10 mg by mouth daily. (Patient not taking: Reported on 05/22/2021)     No current facility-administered medications for this visit.    Allergies:   Canagliflozin and Tetanus toxoids   Social History: Social History   Socioeconomic History   Marital status: Single    Spouse name: Not on file   Number of children: Not on file   Years of education: Not on file   Highest education level: Not on file  Occupational History   Not on file  Tobacco Use   Smoking status: Former   Smokeless tobacco: Never  Substance and Sexual Activity   Alcohol use: No   Drug use: No   Sexual activity: Never  Other Topics Concern   Not on file  Social History Narrative   ** Merged History Encounter **       Social Determinants of Health   Financial Resource Strain: Medium Risk   Difficulty of Paying Living Expenses: Somewhat hard  Food Insecurity: No Food Insecurity   Worried About Charity fundraiser in the Last Year: Never true   Arboriculturist in the Last Year: Never true  Transportation Needs: Not on file  Physical Activity: Not on file  Stress: Not on file  Social Connections: Not on file  Intimate Partner Violence: Not on file    Family  History: Family History  Problem Relation Age of Onset   Heart disease Father    Hypertension Father    Anxiety disorder Sister    Diabetes Mellitus II Sister    Hypertension Sister    Colon polyps Sister    Hyperlipidemia Sister    Anxiety disorder Brother  Heart disease Brother    Depression Brother    Hyperlipidemia Brother     Review of Systems: All other systems reviewed and are otherwise negative except as noted above.   Physical Exam: Vitals:   05/22/21 0930  BP: 124/72  Pulse: 77  SpO2: 98%  Weight: 237 lb (107.5 kg)  Height: 5\' 10"  (1.778 m)     GEN- The patient is well appearing, alert and oriented x 3 today.   HEENT: normocephalic, atraumatic; sclera clear, conjunctiva pink; hearing intact; oropharynx clear; neck supple, no JVP Lymph- no cervical lymphadenopathy Lungs- Clear to ausculation bilaterally, normal work of breathing.  No wheezes, rales, rhonchi Heart- Regular rate and rhythm, no murmurs, rubs or gallops, PMI not laterally displaced GI- soft, non-tender, non-distended, bowel sounds present, no hepatosplenomegaly Extremities- no clubbing or cyanosis. No edema; DP/PT/radial pulses 2+ bilaterally MS- no significant deformity or atrophy Skin- warm and dry, no rash or lesion; ICD pocket well healed Psych- euthymic mood, full affect Neuro- strength and sensation are intact  ICD interrogation- reviewed in detail today,  See PACEART report  EKG:  EKG is not ordered today.  Recent Labs: 11/12/2020: BUN 17; Creatinine, Ser 0.75; Hemoglobin 13.1; Platelets 217; Potassium 4.0; Sodium 134   Wt Readings from Last 3 Encounters:  05/22/21 237 lb (107.5 kg)  11/12/20 231 lb (104.8 kg)  05/20/20 238 lb 6.4 oz (108.1 kg)     Other studies Reviewed: Additional studies/ records that were reviewed today include: Previous EP office notes.   Assessment and Plan:  1.  Chronic systolic dysfunction s/p St. Jude CRT-D  euvolemic today Stable on an appropriate  medical regimen Normal ICD function See Pace Art report No changes today  2. S/p AVR Follows by Dr. Sydnee Levans Last echo 01/2018; will update  Current medicines are reviewed at length with the patient today.    Disposition:   Follow up with EP APP in 6 months    Signed, Shirley Friar, PA-C  05/22/2021 9:38 AM  [facility] [address] [place] [address] [phone] (office) [phone] (fax)

## 2021-05-22 ENCOUNTER — Other Ambulatory Visit: Payer: Self-pay

## 2021-05-22 ENCOUNTER — Ambulatory Visit (INDEPENDENT_AMBULATORY_CARE_PROVIDER_SITE_OTHER): Payer: Medicare Other | Admitting: Student

## 2021-05-22 VITALS — BP 124/72 | HR 77 | Ht 70.0 in | Wt 237.0 lb

## 2021-05-22 DIAGNOSIS — Z95810 Presence of automatic (implantable) cardiac defibrillator: Secondary | ICD-10-CM

## 2021-05-22 DIAGNOSIS — I359 Nonrheumatic aortic valve disorder, unspecified: Secondary | ICD-10-CM

## 2021-05-22 DIAGNOSIS — I5022 Chronic systolic (congestive) heart failure: Secondary | ICD-10-CM

## 2021-05-22 LAB — CUP PACEART INCLINIC DEVICE CHECK
Battery Remaining Longevity: 39 mo
Brady Statistic RA Percent Paced: 0.01 %
Brady Statistic RV Percent Paced: 99.15 %
Date Time Interrogation Session: 20230313101046
HighPow Impedance: 67.5 Ohm
Implantable Lead Implant Date: 20140429
Implantable Lead Implant Date: 20140617
Implantable Lead Implant Date: 20200205
Implantable Lead Location: 753858
Implantable Lead Location: 753859
Implantable Lead Location: 753860
Implantable Pulse Generator Implant Date: 20200205
Lead Channel Impedance Value: 400 Ohm
Lead Channel Impedance Value: 475 Ohm
Lead Channel Impedance Value: 687.5 Ohm
Lead Channel Pacing Threshold Amplitude: 0.75 V
Lead Channel Pacing Threshold Amplitude: 0.75 V
Lead Channel Pacing Threshold Amplitude: 0.875 V
Lead Channel Pacing Threshold Amplitude: 1.875 V
Lead Channel Pacing Threshold Pulse Width: 0.5 ms
Lead Channel Pacing Threshold Pulse Width: 0.5 ms
Lead Channel Pacing Threshold Pulse Width: 0.5 ms
Lead Channel Pacing Threshold Pulse Width: 0.5 ms
Lead Channel Sensing Intrinsic Amplitude: 11.3 mV
Lead Channel Sensing Intrinsic Amplitude: 3.8 mV
Lead Channel Setting Pacing Amplitude: 2 V
Lead Channel Setting Pacing Amplitude: 2.5 V
Lead Channel Setting Pacing Amplitude: 2.875
Lead Channel Setting Pacing Pulse Width: 0.5 ms
Lead Channel Setting Pacing Pulse Width: 0.5 ms
Lead Channel Setting Sensing Sensitivity: 0.5 mV
Pulse Gen Serial Number: 9879509

## 2021-05-22 NOTE — Patient Instructions (Signed)
Medication Instructions:  Your physician recommends that you continue on your current medications as directed. Please refer to the Current Medication list given to you today.  *If you need a refill on your cardiac medications before your next appointment, please call your pharmacy*   Lab Work: None  If you have labs (blood work) drawn today and your tests are completely normal, you will receive your results only by: MyChart Message (if you have MyChart) OR A paper copy in the mail If you have any lab test that is abnormal or we need to change your treatment, we will call you to review the results.   Testing/Procedures: Your physician has requested that you have an echocardiogram. Echocardiography is a painless test that uses sound waves to create images of your heart. It provides your doctor with information about the size and shape of your heart and how well your heart's chambers and valves are working. This procedure takes approximately one hour. There are no restrictions for this procedure.   Follow-Up: At [facility], you and your health needs are our priority.  As part of our continuing mission to provide you with exceptional heart care, we have created designated Provider Care Teams.  These Care Teams include your primary Cardiologist (physician) and Advanced Practice Providers (APPs -  Physician Assistants and Nurse Practitioners) who all work together to provide you with the care you need, when you need it.   Your next appointment:   6 month(s)  The format for your next appointment:   In Person  Provider:   Michael "Andy" Tillery, PA-C   

## 2021-06-05 ENCOUNTER — Ambulatory Visit (INDEPENDENT_AMBULATORY_CARE_PROVIDER_SITE_OTHER): Payer: Medicare Other

## 2021-06-05 DIAGNOSIS — Z95810 Presence of automatic (implantable) cardiac defibrillator: Secondary | ICD-10-CM | POA: Diagnosis not present

## 2021-06-05 DIAGNOSIS — I5022 Chronic systolic (congestive) heart failure: Secondary | ICD-10-CM

## 2021-06-05 NOTE — Progress Notes (Signed)
EPIC Encounter for ICM Monitoring ? ?Patient Name: Donna Hopkins is a 66 y.o. female ?Date: 06/05/2021 ?Primary Care Physican: Dennard Schaumann, MD ?Primary Cardiologist: Hanley Hays ?Electrophysiologist: Allred ?Bi-V Pacing: 98%         ?05/01/2021 Weight: 229 lbs  ?  ?AT/AF Burden:  <1% (takes Warfarin) (1/12 report) ?                                                         ?Spoke with patient and heart failure questions reviewed.  Pt asymptomatic for fluid accumulation.  Reports feeling well at this time and voices no complaints.  ?  ?Corvue Thoracic impedance suggesting normal fluid levels. ?  ?Prescribed: ?Furosemide 40 mg Take 1 tablet (40 mg total) by mouth daily.  Pt has prescription from PCP that says take 1 tablet twice a day. ?Potassium 20 mEq take 1 tablet daily. ?  ?Labs: ?04/04/2021 Creatinine 0.96, BUN 11, Potassium 3.8, Sodium 136 ?11/12/2020 Creatinine 0.75, BUN 17, Potassium 4.0, Sodium 134, GFR >60 ?04/17/2018 Creatinine 0.74, BUN 10, Potassium 4.2, Sodium 132, GFR >60 ?04/14/2018 Creatinine 0.76, BUN 9,   Potassium 4.0, Sodium 135, GFR 83-96  ?A complete set of results can be found in Results Review. ?  ?Recommendations: No changes and encouraged to call if experiencing any fluid symptoms. ?  ?Follow-up plan: ICM clinic phone appointment on 07/10/2021.   91 day device clinic remote transmission 07/24/2020.   ?  ?EP/Cardiology Office Visits:  Recall 11/18/2021 with Otilio Saber, PA.   ?  ?Copy of ICM check sent to Dr. Johney Frame.   ? ?3 month ICM trend: 06/05/2021. ? ? ? ?12-14 Month ICM trend:  ? ? ? ?Karie Soda, RN ?06/05/2021 ?11:11 AM ? ?

## 2021-06-06 ENCOUNTER — Encounter (HOSPITAL_COMMUNITY): Payer: Self-pay | Admitting: Student

## 2021-06-06 ENCOUNTER — Ambulatory Visit (HOSPITAL_COMMUNITY): Payer: Medicare Other | Attending: Student

## 2021-06-06 ENCOUNTER — Other Ambulatory Visit: Payer: Self-pay

## 2021-06-06 ENCOUNTER — Ambulatory Visit (HOSPITAL_COMMUNITY): Payer: Medicare Other | Attending: Internal Medicine

## 2021-06-06 DIAGNOSIS — I359 Nonrheumatic aortic valve disorder, unspecified: Secondary | ICD-10-CM

## 2021-06-06 DIAGNOSIS — I5022 Chronic systolic (congestive) heart failure: Secondary | ICD-10-CM | POA: Diagnosis not present

## 2021-06-06 DIAGNOSIS — Z95810 Presence of automatic (implantable) cardiac defibrillator: Secondary | ICD-10-CM | POA: Diagnosis not present

## 2021-06-06 LAB — ECHOCARDIOGRAM COMPLETE
AV Mean grad: 20.6 mmHg
AV Peak grad: 37.5 mmHg
Ao pk vel: 3.06 m/s
Area-P 1/2: 2.99 cm2
MV M vel: 5.81 m/s
MV Peak grad: 135 mmHg
Radius: 0.6 cm
S' Lateral: 3.5 cm

## 2021-07-10 ENCOUNTER — Ambulatory Visit (INDEPENDENT_AMBULATORY_CARE_PROVIDER_SITE_OTHER): Payer: Medicare Other

## 2021-07-10 DIAGNOSIS — Z95810 Presence of automatic (implantable) cardiac defibrillator: Secondary | ICD-10-CM

## 2021-07-10 DIAGNOSIS — I5022 Chronic systolic (congestive) heart failure: Secondary | ICD-10-CM | POA: Diagnosis not present

## 2021-07-12 ENCOUNTER — Ambulatory Visit: Payer: Medicare Other | Admitting: Podiatry

## 2021-07-14 ENCOUNTER — Telehealth: Payer: Self-pay

## 2021-07-14 NOTE — Progress Notes (Signed)
EPIC Encounter for ICM Monitoring ? ?Patient Name: Donna Hopkins is a 66 y.o. female ?Date: 07/14/2021 ?Primary Care Physican: Dennard Schaumann, MD ?Primary Cardiologist: Hanley Hays ?Electrophysiologist: Allred ?Bi-V Pacing: 98%         ?05/01/2021 Weight: 229 lbs  ?  ?AT/AF Burden:  <1% (takes Warfarin)  ?                                                         ?Attempted call to patient and unable to reach.  Transmission reviewed.  ?  ?Corvue Thoracic impedance suggesting normal fluid levels. ?  ?Prescribed: ?Furosemide 40 mg Take 1 tablet (40 mg total) by mouth daily.  Pt has prescription from PCP that says take 1 tablet twice a day. ?Potassium 20 mEq take 1 tablet daily. ?  ?Labs: ?04/04/2021 Creatinine 0.96, BUN 11, Potassium 3.8, Sodium 136 ?11/12/2020 Creatinine 0.75, BUN 17, Potassium 4.0, Sodium 134, GFR >60 ?04/17/2018 Creatinine 0.74, BUN 10, Potassium 4.2, Sodium 132, GFR >60 ?04/14/2018 Creatinine 0.76, BUN 9,   Potassium 4.0, Sodium 135, GFR 83-96  ?A complete set of results can be found in Results Review. ?  ?Recommendations: Unable to reach.   ?  ?Follow-up plan: ICM clinic phone appointment on 08/14/2021.   91 day device clinic remote transmission 09/23/2020.   ?  ?EP/Cardiology Office Visits:  Recall 11/18/2021 with Otilio Saber, PA.   ?  ?Copy of ICM check sent to Dr. Johney Frame.   ? ?3 month ICM trend: 07/10/2021. ? ? ? ?12-14 Month ICM trend:  ? ? ? ?Karie Soda, RN ?07/14/2021 ?1:51 PM ? ?

## 2021-07-14 NOTE — Telephone Encounter (Signed)
Remote ICM transmission received.  Attempted call to patient regarding ICM remote transmission and no answer.  

## 2021-07-24 ENCOUNTER — Ambulatory Visit (INDEPENDENT_AMBULATORY_CARE_PROVIDER_SITE_OTHER): Payer: Medicare Other

## 2021-07-24 DIAGNOSIS — I428 Other cardiomyopathies: Secondary | ICD-10-CM | POA: Diagnosis not present

## 2021-07-25 LAB — CUP PACEART REMOTE DEVICE CHECK
Battery Remaining Longevity: 40 mo
Battery Remaining Percentage: 53 %
Battery Voltage: 2.93 V
Brady Statistic AP VP Percent: 1 %
Brady Statistic AP VS Percent: 1 %
Brady Statistic AS VP Percent: 98 %
Brady Statistic AS VS Percent: 1.6 %
Brady Statistic RA Percent Paced: 1 %
Date Time Interrogation Session: 20230515084410
HighPow Impedance: 68 Ohm
HighPow Impedance: 68 Ohm
Implantable Lead Implant Date: 20140429
Implantable Lead Implant Date: 20140617
Implantable Lead Implant Date: 20200205
Implantable Lead Location: 753858
Implantable Lead Location: 753859
Implantable Lead Location: 753860
Implantable Pulse Generator Implant Date: 20200205
Lead Channel Impedance Value: 440 Ohm
Lead Channel Impedance Value: 490 Ohm
Lead Channel Impedance Value: 740 Ohm
Lead Channel Pacing Threshold Amplitude: 0.75 V
Lead Channel Pacing Threshold Amplitude: 1 V
Lead Channel Pacing Threshold Amplitude: 2.25 V
Lead Channel Pacing Threshold Pulse Width: 0.5 ms
Lead Channel Pacing Threshold Pulse Width: 0.5 ms
Lead Channel Pacing Threshold Pulse Width: 0.5 ms
Lead Channel Sensing Intrinsic Amplitude: 11.3 mV
Lead Channel Sensing Intrinsic Amplitude: 3.5 mV
Lead Channel Setting Pacing Amplitude: 2 V
Lead Channel Setting Pacing Amplitude: 2.5 V
Lead Channel Setting Pacing Amplitude: 3.25 V
Lead Channel Setting Pacing Pulse Width: 0.5 ms
Lead Channel Setting Pacing Pulse Width: 0.5 ms
Lead Channel Setting Sensing Sensitivity: 0.5 mV
Pulse Gen Serial Number: 9879509

## 2021-07-26 ENCOUNTER — Ambulatory Visit: Payer: Medicare Other | Admitting: Podiatry

## 2021-08-11 NOTE — Progress Notes (Signed)
Remote ICD transmission.   

## 2021-08-14 ENCOUNTER — Ambulatory Visit (INDEPENDENT_AMBULATORY_CARE_PROVIDER_SITE_OTHER): Payer: Medicare Other

## 2021-08-14 DIAGNOSIS — I5022 Chronic systolic (congestive) heart failure: Secondary | ICD-10-CM | POA: Diagnosis not present

## 2021-08-14 DIAGNOSIS — Z95810 Presence of automatic (implantable) cardiac defibrillator: Secondary | ICD-10-CM | POA: Diagnosis not present

## 2021-08-17 ENCOUNTER — Telehealth: Payer: Self-pay

## 2021-08-17 NOTE — Progress Notes (Signed)
EPIC Encounter for ICM Monitoring  Patient Name: Donna Hopkins is a 66 y.o. female Date: 08/17/2021 Primary Care Physican: Dennard Schaumann, MD Primary Cardiologist: Hanley Hays Electrophysiologist: Allred Bi-V Pacing: 98%         05/01/2021 Weight: 229 lbs    AT/AF Burden:  <1% (takes Warfarin)                                                           Attempted call to patient and unable to reach.  Transmission reviewed.    Corvue Thoracic impedance suggesting normal fluid levels.   Prescribed: Furosemide 40 mg Take 1 tablet (40 mg total) by mouth daily.  Pt has prescription from PCP that says take 1 tablet twice a day. Potassium 20 mEq take 1 tablet daily.   Labs: 04/04/2021 Creatinine 0.96, BUN 11, Potassium 3.8, Sodium 136 11/12/2020 Creatinine 0.75, BUN 17, Potassium 4.0, Sodium 134, GFR >60 04/17/2018 Creatinine 0.74, BUN 10, Potassium 4.2, Sodium 132, GFR >60 04/14/2018 Creatinine 0.76, BUN 9,   Potassium 4.0, Sodium 135, GFR 83-96  A complete set of results can be found in Results Review.   Recommendations: Unable to reach.     Follow-up plan: ICM clinic phone appointment on 09/18/2021.   91 day device clinic remote transmission 09/23/2020.     EP/Cardiology Office Visits:  Recall 11/18/2021 with Otilio Saber, PA.     Copy of ICM check sent to Dr. Johney Frame.    3 month ICM trend: 08/14/2021.    12-14 Month ICM trend:     Karie Soda, RN 08/17/2021 1:38 PM

## 2021-08-17 NOTE — Telephone Encounter (Signed)
Remote ICM transmission received.  Attempted call to patient regarding ICM remote transmission and left message to return call   

## 2021-08-23 ENCOUNTER — Ambulatory Visit: Payer: Medicare Other | Admitting: Podiatry

## 2021-09-18 ENCOUNTER — Ambulatory Visit (INDEPENDENT_AMBULATORY_CARE_PROVIDER_SITE_OTHER): Payer: Medicare Other

## 2021-09-18 DIAGNOSIS — I5022 Chronic systolic (congestive) heart failure: Secondary | ICD-10-CM

## 2021-09-18 DIAGNOSIS — Z95810 Presence of automatic (implantable) cardiac defibrillator: Secondary | ICD-10-CM | POA: Diagnosis not present

## 2021-09-19 ENCOUNTER — Telehealth: Payer: Self-pay | Admitting: Internal Medicine

## 2021-09-19 NOTE — Telephone Encounter (Signed)
Patient is calling stating her monitor cords were lost while moving and she is unsure what to do. Please advise.

## 2021-09-19 NOTE — Telephone Encounter (Signed)
LMOVM for patient to call [employer] to order the cords to her monitor.

## 2021-09-21 ENCOUNTER — Telehealth: Payer: Self-pay

## 2021-09-21 NOTE — Progress Notes (Signed)
EPIC Encounter for ICM Monitoring  Patient Name: Donna Hopkins is a 66 y.o. female Date: 09/21/2021 Primary Care Physican: Dennard Schaumann, MD Primary Cardiologist: Hanley Hays Electrophysiologist: Allred Bi-V Pacing: 99%         05/01/2021 Weight: 229 lbs    AT/AF Burden:  <1% (takes Warfarin)                                                           Attempted call to patient and unable to reach.   Transmission reviewed.    Corvue Thoracic impedance suggesting normal fluid levels.   Prescribed: Furosemide 40 mg Take 1 tablet (40 mg total) by mouth daily.  Pt has prescription from PCP that says take 1 tablet twice a day. Potassium 20 mEq take 1 tablet daily.   Labs: 04/04/2021 Creatinine 0.96, BUN 11, Potassium 3.8, Sodium 136 11/12/2020 Creatinine 0.75, BUN 17, Potassium 4.0, Sodium 134, GFR >60 04/17/2018 Creatinine 0.74, BUN 10, Potassium 4.2, Sodium 132, GFR >60 04/14/2018 Creatinine 0.76, BUN 9,   Potassium 4.0, Sodium 135, GFR 83-96  A complete set of results can be found in Results Review.   Recommendations:  Unable to reach.      Follow-up plan: ICM clinic phone appointment on 10/24/2021.   91 day device clinic remote transmission 10/23/2021.     EP/Cardiology Office Visits:  Recall 11/18/2021 with Otilio Saber, PA.     Copy of ICM check sent to Dr. Johney Frame.    3 month ICM trend: 09/21/2021.    12-14 Month ICM trend:     Karie Soda, RN 09/21/2021 11:11 AM

## 2021-09-21 NOTE — Telephone Encounter (Signed)
Remote ICM transmission received.  Attempted call to patient regarding ICM remote transmission and no answer.  

## 2021-09-25 ENCOUNTER — Ambulatory Visit: Payer: Medicare Other | Admitting: Podiatry

## 2021-10-23 ENCOUNTER — Ambulatory Visit (INDEPENDENT_AMBULATORY_CARE_PROVIDER_SITE_OTHER): Payer: Medicare Other

## 2021-10-23 ENCOUNTER — Ambulatory Visit (INDEPENDENT_AMBULATORY_CARE_PROVIDER_SITE_OTHER): Payer: Medicare Other | Admitting: Podiatry

## 2021-10-23 ENCOUNTER — Ambulatory Visit: Payer: Medicare Other

## 2021-10-23 DIAGNOSIS — M79671 Pain in right foot: Secondary | ICD-10-CM | POA: Diagnosis not present

## 2021-10-23 DIAGNOSIS — L97522 Non-pressure chronic ulcer of other part of left foot with fat layer exposed: Secondary | ICD-10-CM

## 2021-10-23 DIAGNOSIS — M79675 Pain in left toe(s): Secondary | ICD-10-CM

## 2021-10-23 DIAGNOSIS — B351 Tinea unguium: Secondary | ICD-10-CM

## 2021-10-23 DIAGNOSIS — E0843 Diabetes mellitus due to underlying condition with diabetic autonomic (poly)neuropathy: Secondary | ICD-10-CM

## 2021-10-23 DIAGNOSIS — M79674 Pain in right toe(s): Secondary | ICD-10-CM

## 2021-10-23 MED ORDER — GENTAMICIN SULFATE 0.1 % EX CREA
1.0000 | TOPICAL_CREAM | Freq: Two times a day (BID) | CUTANEOUS | 1 refills | Status: DC
Start: 2021-10-23 — End: 2022-11-10

## 2021-10-23 NOTE — Progress Notes (Signed)
Chief Complaint  Patient presents with   Bunions    Bilateral bunion    Nail Problem    Patient is here for bilateral foot pain, she also has toenail fungus and it very painful, patient is diabetic.    Subjective:  66 y.o. female with PMHx of diabetes mellitus presenting to the office today for evaluation of discoloration and tenderness to the left second toe.  Patient states that she did stubbed her toe a few weeks ago.  She has noticed some discoloration.  She also states that she has chronic foot pain to the bilateral feet.  Pain is especially localized to the distal tips of the toes bilateral   Past Medical History:  Diagnosis Date   Carotid stenosis, asymptomatic, bilateral    Chronic kidney disease, stage III (moderate) (HCC)    Chronic low back pain    Clinical systolic heart failure, chronic (HCC)    Depression    Diabetes mellitus    Eosinophilic gastritis    GERD (gastroesophageal reflux disease)    Gout, renal disease    H/O mechanical aortic valve replacement    on coumadin   Hx of aortic valve replacement    Hypertension    Hypocalcemia    Hyponatremia    ICD (implantable cardioverter-defibrillator) in place    Medication refill    Mixed hyperlipidemia    Neuropathy    Obesity    OP (osteoporosis)    Peripheral vascular disease, unspecified (HCC)    Persistent insomnia    Personal history of colonic polyps    Pulmonary hypertension (HCC)    Snoring    declines sleep study   Tricuspid regurgitation    Uncontrolled type 2 diabetes mellitus with complication (HCC)    Valgus foot    Vertigo, benign positional    Vitamin D deficiency     Past Surgical History:  Procedure Laterality Date   ABDOMINAL HYSTERECTOMY     BACK SURGERY     BI-VENTRICULAR IMPLANTABLE CARDIOVERTER DEFIBRILLATOR  (CRT-D)  07/08/2012   SJM Quadra Assura BiV ICD implanted by Dr Reed Pandy at [facility]   CARDIAC VALVE REPLACEMENT     LEAD INSERTION N/A 04/16/2018   Successful BiV ICD  system revision with a new RV lead and pulse generator replacement with a [employer] Assura MP model [id number] ICD    Allergies  Allergen Reactions   Canagliflozin Shortness Of Breath   Tetanus Toxoids     Infection at injection site         Objective/Physical Exam General: The patient is alert and oriented x3 in no acute distress.  Dermatology:  Wound #1 noted to the to the plantar aspect/distal tip of the left second toe which measures approximately 0.5 x 0.5 x 0.1 Cm (LxWxD).  there is some discoloration also noted to the second digit compared to the adjacent toes.  Concern for possible vascular compromise.  Please see above noted photo  No eschar.  Minimal drainage.  No exposed bone muscle tendon ligament or joint.  Clinically no indication of acute infection Hyperkeratotic dystrophic nails also noted 1-5 bilateral.  Vascular: Dusky discoloration noted to the left second digit concerning for possible ischemia.  Skin is cool to touch.  Edema makes it hard to palpate any lower extremity pulses  Neurological: Epicritic and protective threshold diminished bilaterally.   Musculoskeletal Exam: Hallux valgus with hammertoe deformity noted 1-5 bilateral.  No prior amputations  Assessment: 1.  Ulcer distal tip of the  left second toe secondary to diabetes mellitus 2. diabetes mellitus w/ peripheral neuropathy 3.  Pain due to onychomycosis of toenails both   Plan of Care:  1. Patient was evaluated. 2. medically necessary excisional debridement including subcutaneous tissue was performed using a tissue nipper and a chisel blade. Excisional debridement of all the necrotic nonviable tissue down to healthy bleeding viable tissue was performed with post-debridement measurements same as pre-. 3. the wound was cleansed and dry sterile dressing applied. 4.  Prescription for gentamicin cream.  Apply daily to the second toe wound 5.  Mechanical debridement of nails 1-5 bilateral  was performed using a nail nipper without incident or bleeding 6.  order was placed last year for lower extremity ABIs however the patient never had them completed.  Updated order for ABIs placed 7.  Return to clinic 4 weeks   Felecia Shelling, DPM [facility]  Dr. Felecia Shelling, DPM    [address] [place], [place] [address]                Office [phone]  Fax [phone]

## 2021-10-23 NOTE — Progress Notes (Signed)
Dg  

## 2021-10-24 ENCOUNTER — Ambulatory Visit (INDEPENDENT_AMBULATORY_CARE_PROVIDER_SITE_OTHER): Payer: Medicare Other

## 2021-10-24 DIAGNOSIS — Z95810 Presence of automatic (implantable) cardiac defibrillator: Secondary | ICD-10-CM

## 2021-10-24 DIAGNOSIS — I5022 Chronic systolic (congestive) heart failure: Secondary | ICD-10-CM | POA: Diagnosis not present

## 2021-10-24 LAB — CUP PACEART REMOTE DEVICE CHECK
Battery Remaining Longevity: 35 mo
Battery Remaining Percentage: 49 %
Battery Voltage: 2.93 V
Brady Statistic AP VP Percent: 1 %
Brady Statistic AP VS Percent: 1 %
Brady Statistic AS VP Percent: 99 %
Brady Statistic AS VS Percent: 1 %
Brady Statistic RA Percent Paced: 1 %
Date Time Interrogation Session: 20230814020017
HighPow Impedance: 70 Ohm
HighPow Impedance: 70 Ohm
Implantable Lead Implant Date: 20140429
Implantable Lead Implant Date: 20140617
Implantable Lead Implant Date: 20200205
Implantable Lead Location: 753858
Implantable Lead Location: 753859
Implantable Lead Location: 753860
Implantable Pulse Generator Implant Date: 20200205
Lead Channel Impedance Value: 390 Ohm
Lead Channel Impedance Value: 450 Ohm
Lead Channel Impedance Value: 650 Ohm
Lead Channel Pacing Threshold Amplitude: 0.75 V
Lead Channel Pacing Threshold Amplitude: 0.875 V
Lead Channel Pacing Threshold Amplitude: 2.25 V
Lead Channel Pacing Threshold Pulse Width: 0.5 ms
Lead Channel Pacing Threshold Pulse Width: 0.5 ms
Lead Channel Pacing Threshold Pulse Width: 0.5 ms
Lead Channel Sensing Intrinsic Amplitude: 12 mV
Lead Channel Sensing Intrinsic Amplitude: 3.2 mV
Lead Channel Setting Pacing Amplitude: 2 V
Lead Channel Setting Pacing Amplitude: 2.5 V
Lead Channel Setting Pacing Amplitude: 3.25 V
Lead Channel Setting Pacing Pulse Width: 0.5 ms
Lead Channel Setting Pacing Pulse Width: 0.5 ms
Lead Channel Setting Sensing Sensitivity: 0.5 mV
Pulse Gen Serial Number: 9879509

## 2021-10-24 NOTE — Progress Notes (Unsigned)
EPIC Encounter for ICM Monitoring  Patient Name: Donna Hopkins is a 66 y.o. female Date: 10/24/2021 Primary Care Physican: Dennard Schaumann, MD Primary Cardiologist: Hanley Hays Electrophysiologist: Allred Bi-V Pacing: 99%         10/26/2021 Weight: 223 lbs   AT/AF Burden:  <1% (takes Warfarin)                                                           Spoke with patient and heart failure questions reviewed.  Pt asymptomatic for fluid accumulation.  She had 8-10 lb weight gain during decreased impedance.  She was unable to take fluid pills for a couple of days due to death of her nephew but has restarted the prescribed dosage.    Corvue Thoracic impedance suggesting normal fluid levels.   Prescribed: Furosemide 40 mg Take 1 tablet (40 mg total) by mouth daily.  She reports 10/26/21 she takes 80 mg daily as prescribed by PCP.  Potassium 20 mEq take 1 tablet daily.   Labs: 04/04/2021 Creatinine 0.96, BUN 11, Potassium 3.8, Sodium 136 11/12/2020 Creatinine 0.75, BUN 17, Potassium 4.0, Sodium 134, GFR >60 04/17/2018 Creatinine 0.74, BUN 10, Potassium 4.2, Sodium 132, GFR >60 04/14/2018 Creatinine 0.76, BUN 9,   Potassium 4.0, Sodium 135, GFR 83-96  A complete set of results can be found in Results Review.   Recommendations:  No changes and encouraged to call if experiencing any fluid symptoms.   Follow-up plan: ICM clinic phone appointment on 11/27/2021.   91 day device clinic remote transmission 01/22/2022.     EP/Cardiology Office Visits:  Recall 11/18/2021 with Otilio Saber, PA.     Copy of ICM check sent to Dr. Johney Frame.    3 month ICM trend: 10/23/2021.    12-14 Month ICM trend:     Karie Soda, RN 10/24/2021 8:09 AM

## 2021-11-06 ENCOUNTER — Telehealth (HOSPITAL_COMMUNITY): Payer: Self-pay

## 2021-11-20 ENCOUNTER — Telehealth (HOSPITAL_COMMUNITY): Payer: Self-pay

## 2021-11-20 ENCOUNTER — Ambulatory Visit (INDEPENDENT_AMBULATORY_CARE_PROVIDER_SITE_OTHER): Payer: Medicare Other | Admitting: Podiatry

## 2021-11-20 DIAGNOSIS — L989 Disorder of the skin and subcutaneous tissue, unspecified: Secondary | ICD-10-CM | POA: Diagnosis not present

## 2021-11-20 NOTE — Progress Notes (Signed)
Chief Complaint  Patient presents with   Callouses    Patient is here for callous and toe nail fungus follow-up.patient is diabetic.    Subjective: 66 y.o. female presenting to the office today for follow-up evaluation of an ulcer to the left second toe.  She says that the wound has healed and the toe feels significantly better.  She is also requesting to have her calluses debrided today.  She presents for further treatment and evaluation   Past Medical History:  Diagnosis Date   Carotid stenosis, asymptomatic, bilateral    Chronic kidney disease, stage III (moderate) (HCC)    Chronic low back pain    Clinical systolic heart failure, chronic (HCC)    Depression    Diabetes mellitus    Eosinophilic gastritis    GERD (gastroesophageal reflux disease)    Gout, renal disease    H/O mechanical aortic valve replacement    on coumadin   Hx of aortic valve replacement    Hypertension    Hypocalcemia    Hyponatremia    ICD (implantable cardioverter-defibrillator) in place    Medication refill    Mixed hyperlipidemia    Neuropathy    Obesity    OP (osteoporosis)    Peripheral vascular disease, unspecified (HCC)    Persistent insomnia    Personal history of colonic polyps    Pulmonary hypertension (HCC)    Snoring    declines sleep study   Tricuspid regurgitation    Uncontrolled type 2 diabetes mellitus with complication (HCC)    Valgus foot    Vertigo, benign positional    Vitamin D deficiency     Past Surgical History:  Procedure Laterality Date   ABDOMINAL HYSTERECTOMY     BACK SURGERY     BI-VENTRICULAR IMPLANTABLE CARDIOVERTER DEFIBRILLATOR  (CRT-D)  07/08/2012   SJM Quadra Assura BiV ICD implanted by Dr Reed Pandy at [facility]   CARDIAC VALVE REPLACEMENT     LEAD INSERTION N/A 04/16/2018   Successful BiV ICD system revision with a new RV lead and pulse generator replacement with a [employer] Assura MP model [id number] ICD    Allergies  Allergen  Reactions   Canagliflozin Shortness Of Breath   Tetanus Toxoids     Infection at injection site     Objective:  Physical Exam General: Alert and oriented x3 in no acute distress  Dermatology: Hyperkeratotic lesion(s) present on the bilateral feet. Pain on palpation with a central nucleated core noted. Skin is warm, dry and supple bilateral lower extremities. Negative for open lesions or macerations.  Vascular: Palpable pedal pulses bilaterally. No edema or erythema noted. Capillary refill within normal limits.  Neurological: Epicritic and protective threshold grossly intact bilaterally.   Musculoskeletal Exam: Pain on palpation at the keratotic lesion(s) noted. Range of motion within normal limits bilateral. Muscle strength 5/5 in all groups bilateral.  Assessment: 1.  Symptomatic callus; benign skin lesion 2.  Ulcer left second toe; resolved   Plan of Care:  1. Patient evaluated 2. Excisional debridement of keratoic lesion(s) using a chisel blade was performed without incident as a courtesy for the patient.  3.  The ulcer to the toe has completely healed and resolved.  There was some callus tissue around the area which was also debrided 4. Patient is to return to the clinic PRN.   Felecia Shelling, DPM [facility]  Dr. Felecia Shelling, DPM    [address]  [place], [place] [address]                Office [phone]  Fax [phone]

## 2021-11-20 NOTE — Telephone Encounter (Signed)
After receiving order for patient to have Vascular Ultrasound, attempted contact on the dates below, unable to reach patient for scheduling.  If patient unable to be reached by 11/27/21 order to be canceled.   Dates attempted contact: 11/20/21 left voice message patient to call back to schedule /dd 11/06/21 unable leave message voice mail full /dd 10/24/21 left voice message patient to call back to schedule /dd

## 2021-11-27 ENCOUNTER — Ambulatory Visit (INDEPENDENT_AMBULATORY_CARE_PROVIDER_SITE_OTHER): Payer: Medicare Other

## 2021-11-27 DIAGNOSIS — Z95810 Presence of automatic (implantable) cardiac defibrillator: Secondary | ICD-10-CM

## 2021-11-27 DIAGNOSIS — I5022 Chronic systolic (congestive) heart failure: Secondary | ICD-10-CM | POA: Diagnosis not present

## 2021-11-28 ENCOUNTER — Telehealth: Payer: Self-pay | Admitting: Internal Medicine

## 2021-11-28 MED ORDER — AMOXICILLIN 500 MG PO TABS: ORAL_TABLET | ORAL | 0 refills | Status: DC

## 2021-11-28 NOTE — Telephone Encounter (Signed)
   Patient Name: Leota Maka  DOB: 23-Feb-1956 MRN: [id number]  Primary Cardiologist: None  Chart reviewed as part of pre-operative protocol coverage.   IF SIMPLE EXTRACTION/CLEANINGS: Simple dental extractions (i.e. 1-2 teeth) are considered low risk procedures per guidelines and generally do not require any specific cardiac clearance. It is also generally accepted that for simple extractions and dental cleanings, there is no need to interrupt blood thinner therapy.  SBE prophylaxis is required for the patient from a cardiac standpoint.I have sent prescription to her pharmacy.   If coumadin hold is needed, prescribing physician will need to be contacted.   I will route this recommendation to the requesting party via Epic fax function and remove from pre-op pool.  Please call with questions.  Elgie Collard, PA-C 11/28/2021, 3:35 PM

## 2021-11-28 NOTE — Telephone Encounter (Signed)
   Pre-operative Risk Assessment    Patient Name: Donna Hopkins  DOB: 1955/06/19 MRN: [id number]     Request for Surgical Clearance    Procedure:  Dental Extraction - Amount of Teeth to be Pulled:  1 tooth 28  Date of Surgery:  Clearance 11/28/21                                 Surgeon:  Dr Zettie Pho  Surgeon's Group or Practice Name:  [facility] Phone number:  [phone] Fax number:  [phone]   Type of Clearance Requested:   - Medical  - Pharmacy:  Hold        Type of Anesthesia:  Local    Additional requests/questions:      SignedMilbert Coulter   11/28/2021, 2:39 PM

## 2021-12-01 ENCOUNTER — Telehealth: Payer: Self-pay

## 2021-12-01 NOTE — Progress Notes (Signed)
EPIC Encounter for ICM Monitoring  Patient Name: Donna Hopkins is a 66 y.o. female Date: 12/01/2021 Primary Care Physican: Lorelei Pont, MD Primary Cardiologist: Claudie Leach Electrophysiologist: Curt Bears Bi-V Pacing: 99%         05/01/2021 Weight: 229 lbs    AT/AF Burden:  <1% (takes Warfarin)                                                           Attempted call to patient and unable to reach.   Transmission reviewed.    Corvue Thoracic impedance suggesting intermittent days with possible fluid accumulation followed by possible dryness 9/8-9/15 and then back to normal 9/16.   Prescribed: Furosemide 40 mg Take 1 tablet (40 mg total) by mouth daily.  Pt has prescription from PCP that says take 1 tablet twice a day. Potassium 20 mEq take 1 tablet daily.   Labs: 04/04/2021 Creatinine 0.96, BUN 11, Potassium 3.8, Sodium 136 11/12/2020 Creatinine 0.75, BUN 17, Potassium 4.0, Sodium 134, GFR >60 04/17/2018 Creatinine 0.74, BUN 10, Potassium 4.2, Sodium 132, GFR >60 04/14/2018 Creatinine 0.76, BUN 9,   Potassium 4.0, Sodium 135, GFR 83-96  A complete set of results can be found in Results Review.   Recommendations:  Unable to reach.      Follow-up plan: ICM clinic phone appointment on 01/01/2022.   91 day device clinic remote transmission 01/22/2022.     EP/Cardiology Office Visits:  Recall 11/18/2021 with Oda Kilts, PA.     Copy of ICM check sent to Dr. Curt Bears.   3 month ICM trend: 11/27/2021.    12-14 Month ICM trend:     Rosalene Billings, RN 12/01/2021 3:36 PM

## 2021-12-01 NOTE — Telephone Encounter (Signed)
Remote ICM transmission received.  Attempted call to patient regarding ICM remote transmission and no answer or voice mail box. 

## 2022-01-01 ENCOUNTER — Ambulatory Visit (INDEPENDENT_AMBULATORY_CARE_PROVIDER_SITE_OTHER): Payer: Medicare Other

## 2022-01-01 DIAGNOSIS — Z95810 Presence of automatic (implantable) cardiac defibrillator: Secondary | ICD-10-CM

## 2022-01-01 DIAGNOSIS — I5022 Chronic systolic (congestive) heart failure: Secondary | ICD-10-CM | POA: Diagnosis not present

## 2022-01-01 NOTE — Progress Notes (Signed)
EPIC Encounter for ICM Monitoring  Patient Name: Donna Hopkins is a 66 y.o. female Date: 01/01/2022 Primary Care Physican: Lorelei Pont, MD Primary Cardiologist: Claudie Leach Electrophysiologist: Curt Bears Bi-V Pacing: >99%         05/01/2021 Weight: 229 lbs    AT/AF Burden:  <1% (takes Warfarin)                                                                  Transmission reviewed.    Corvue Thoracic impedance suggesting intermittent days with possible fluid accumulation followed by possible dryness.   Prescribed: Furosemide 40 mg Take 1 tablet (40 mg total) by mouth daily.  Pt has prescription from PCP that says take 1 tablet twice a day. Potassium 20 mEq take 1 tablet daily.   Labs: 04/04/2021 Creatinine 0.96, BUN 11, Potassium 3.8, Sodium 136 11/12/2020 Creatinine 0.75, BUN 17, Potassium 4.0, Sodium 134, GFR >60 04/17/2018 Creatinine 0.74, BUN 10, Potassium 4.2, Sodium 132, GFR >60 04/14/2018 Creatinine 0.76, BUN 9,   Potassium 4.0, Sodium 135, GFR 83-96  A complete set of results can be found in Results Review.   Recommendations:  No changes.      Follow-up plan: ICM clinic phone appointment on 02/12/2022.   91 day device clinic remote transmission 01/22/2022.     EP/Cardiology Office Visits:  Recall 11/18/2021 with Oda Kilts, PA.     Copy of ICM check sent to Dr. Curt Bears.   3 month ICM trend: 01/01/2022.    12-14 Month ICM trend:     Donna Billings, RN 01/01/2022 5:54 PM

## 2022-01-22 ENCOUNTER — Ambulatory Visit (INDEPENDENT_AMBULATORY_CARE_PROVIDER_SITE_OTHER): Payer: Medicare Other

## 2022-01-22 DIAGNOSIS — I5022 Chronic systolic (congestive) heart failure: Secondary | ICD-10-CM

## 2022-01-23 LAB — CUP PACEART REMOTE DEVICE CHECK
Battery Remaining Longevity: 32 mo
Battery Remaining Percentage: 45 %
Battery Voltage: 2.93 V
Brady Statistic AP VP Percent: 1 %
Brady Statistic AP VS Percent: 1 %
Brady Statistic AS VP Percent: 99 %
Brady Statistic AS VS Percent: 1 %
Brady Statistic RA Percent Paced: 1 %
Date Time Interrogation Session: 20231113020015
HighPow Impedance: 79 Ohm
HighPow Impedance: 79 Ohm
Implantable Lead Connection Status: 753985
Implantable Lead Connection Status: 753985
Implantable Lead Connection Status: 753985
Implantable Lead Implant Date: 20140429
Implantable Lead Implant Date: 20140617
Implantable Lead Implant Date: 20200205
Implantable Lead Location: 753858
Implantable Lead Location: 753859
Implantable Lead Location: 753860
Implantable Pulse Generator Implant Date: 20200205
Lead Channel Impedance Value: 440 Ohm
Lead Channel Impedance Value: 480 Ohm
Lead Channel Impedance Value: 730 Ohm
Lead Channel Pacing Threshold Amplitude: 0.75 V
Lead Channel Pacing Threshold Amplitude: 0.75 V
Lead Channel Pacing Threshold Amplitude: 2.25 V
Lead Channel Pacing Threshold Pulse Width: 0.5 ms
Lead Channel Pacing Threshold Pulse Width: 0.5 ms
Lead Channel Pacing Threshold Pulse Width: 0.5 ms
Lead Channel Sensing Intrinsic Amplitude: 12 mV
Lead Channel Sensing Intrinsic Amplitude: 3.1 mV
Lead Channel Setting Pacing Amplitude: 2 V
Lead Channel Setting Pacing Amplitude: 2.5 V
Lead Channel Setting Pacing Amplitude: 3.25 V
Lead Channel Setting Pacing Pulse Width: 0.5 ms
Lead Channel Setting Pacing Pulse Width: 0.5 ms
Lead Channel Setting Sensing Sensitivity: 0.5 mV
Pulse Gen Serial Number: 9879509

## 2022-02-12 ENCOUNTER — Ambulatory Visit (INDEPENDENT_AMBULATORY_CARE_PROVIDER_SITE_OTHER): Payer: Medicare Other

## 2022-02-12 DIAGNOSIS — Z95810 Presence of automatic (implantable) cardiac defibrillator: Secondary | ICD-10-CM

## 2022-02-12 DIAGNOSIS — I5022 Chronic systolic (congestive) heart failure: Secondary | ICD-10-CM | POA: Diagnosis not present

## 2022-02-12 NOTE — Progress Notes (Unsigned)
EPIC Encounter for ICM Monitoring  Patient Name: Donna Hopkins is a 66 y.o. female Date: 02/12/2022 Primary Care Physican: Dennard Schaumann, MD Primary Cardiologist: Hanley Hays Electrophysiologist: Elberta Fortis Bi-V Pacing: >99%         05/01/2021 Weight: 229 lbs    AT/AF Burden:  <1% (takes Warfarin)                                                                  Spoke with patient and heart failure questions reviewed.  Transmission results reviewed.  Pt asymptomatic for fluid accumulation.  She has had diarrhea this past week for past few days which may contribute to possible dryness.       Corvue Thoracic impedance suggesting possible dryness starting 11/29 and trending back to baseline.   Prescribed: Furosemide 40 mg Take 1 tablet (40 mg total) by mouth daily.  She reports 02/12/22, she is taking Furosemide 40 mg twice as ordered by PCP.   Potassium 20 mEq take 1 tablet daily.   Labs: 04/04/2021 Creatinine 0.96, BUN 11, Potassium 3.8, Sodium 136 11/12/2020 Creatinine 0.75, BUN 17, Potassium 4.0, Sodium 134, GFR >60 04/17/2018 Creatinine 0.74, BUN 10, Potassium 4.2, Sodium 132, GFR >60 04/14/2018 Creatinine 0.76, BUN 9,   Potassium 4.0, Sodium 135, GFR 83-96  A complete set of results can be found in Results Review.   Recommendations:  Advised to hold Furosemide dosages tomorrow and increase fluid intake today and tomorrow.  After tomorrow take prescribed Furosemide dosage and drink about 64 oz fluid a day.     Follow-up plan: ICM clinic phone appointment on 02/19/2022 to recheck fluid levels.   91 day device clinic remote transmission 01/22/2022.     EP/Cardiology Office Visits:  Recall 11/18/2021 with Otilio Saber, PA.     Copy of ICM check sent to Dr. Elberta Fortis.    3 month ICM trend: 02/12/2022.    12-14 Month ICM trend:     Karie Soda, RN 02/12/2022 4:07 PM

## 2022-02-19 ENCOUNTER — Ambulatory Visit (INDEPENDENT_AMBULATORY_CARE_PROVIDER_SITE_OTHER): Payer: Medicare Other

## 2022-02-19 DIAGNOSIS — I5022 Chronic systolic (congestive) heart failure: Secondary | ICD-10-CM

## 2022-02-19 DIAGNOSIS — Z95810 Presence of automatic (implantable) cardiac defibrillator: Secondary | ICD-10-CM

## 2022-02-19 NOTE — Progress Notes (Signed)
EPIC Encounter for ICM Monitoring  Patient Name: Donna Hopkins is a 66 y.o. female Date: 02/19/2022 Primary Care Physican: Dennard Schaumann, MD Primary Cardiologist: Hanley Hays Electrophysiologist: Elberta Fortis Bi-V Pacing: >99%         05/01/2021 Weight: 229 lbs  02/19/2022 Weight: 223 lbs   AT/AF Burden:  <1% (takes Warfarin)                                                                  Spoke with patient and heart failure questions reviewed.  Transmission results reviewed.  Pt asymptomatic for fluid accumulation.            Diet:  Does not follow low salt diet.  Eats a lot of ice and drinks > 64 oz daily   Corvue Thoracic impedance suggesting fluid levels went from possible dryness to possible fluid accumulation due to increased fluid and salt intake.   Prescribed: Furosemide 40 mg Take 1 tablet (40 mg total) by mouth daily.  She reports 02/12/22, she is taking Furosemide 40 mg twice as ordered by PCP.   Potassium 20 mEq take 1 tablet daily.   Labs: 04/04/2021 Creatinine 0.96, BUN 11, Potassium 3.8, Sodium 136 11/12/2020 Creatinine 0.75, BUN 17, Potassium 4.0, Sodium 134, GFR >60 04/17/2018 Creatinine 0.74, BUN 10, Potassium 4.2, Sodium 132, GFR >60 04/14/2018 Creatinine 0.76, BUN 9,   Potassium 4.0, Sodium 135, GFR 83-96  A complete set of results can be found in Results Review.   Recommendations:  Advised to take 1 extra Furosemide tomorrow with 1 extra Potassium for one day only.  After tomorrow, resume prescribed dosages of Furosemide and Potassium.  She repeated instructions back correctly.     Follow-up plan: ICM clinic phone appointment on 02/26/2022 to recheck fluid levels.   91 day device clinic remote transmission 04/23/2022     EP/Cardiology Office Visits:  03/19/2022 with Otilio Saber, PA.     Copy of ICM check sent to Dr. Elberta Fortis.     3 month ICM trend: 02/19/2022.    12-14 Month ICM trend:     Karie Soda, RN 02/19/2022 4:20 PM

## 2022-02-26 ENCOUNTER — Ambulatory Visit (INDEPENDENT_AMBULATORY_CARE_PROVIDER_SITE_OTHER): Payer: Medicare Other

## 2022-02-26 DIAGNOSIS — I5022 Chronic systolic (congestive) heart failure: Secondary | ICD-10-CM

## 2022-02-26 DIAGNOSIS — Z95810 Presence of automatic (implantable) cardiac defibrillator: Secondary | ICD-10-CM

## 2022-02-27 ENCOUNTER — Ambulatory Visit: Payer: Medicare Other | Admitting: Podiatry

## 2022-02-27 NOTE — Progress Notes (Signed)
EPIC Encounter for ICM Monitoring  Patient Name: Donna Hopkins is a 66 y.o. female Date: 02/27/2022 Primary Care Physican: Dennard Schaumann, MD Primary Cardiologist: Hanley Hays Electrophysiologist: Elberta Fortis Bi-V Pacing: >99%         05/01/2021 Weight: 229 lbs  02/19/2022 Weight: 223 lbs   AT/AF Burden:  <1% (takes Warfarin)                                                                  Spoke with patient and heart failure questions reviewed.  Transmission results reviewed.  Pt asymptomatic for fluid accumulation.             Diet:  Does not follow low salt diet.  Eats a lot of ice and drinks > 64 oz daily   Corvue Thoracic impedance suggesting fluid levels returned to normal after taking 1 extra Furosemide.    Prescribed: Furosemide 40 mg Take 1 tablet (40 mg total) by mouth daily.  She reports 02/12/22, she is taking Furosemide 40 mg twice as ordered by PCP.   Potassium 20 mEq take 1 tablet daily.   Labs: 04/04/2021 Creatinine 0.96, BUN 11, Potassium 3.8, Sodium 136 11/12/2020 Creatinine 0.75, BUN 17, Potassium 4.0, Sodium 134, GFR >60 04/17/2018 Creatinine 0.74, BUN 10, Potassium 4.2, Sodium 132, GFR >60 04/14/2018 Creatinine 0.76, BUN 9,   Potassium 4.0, Sodium 135, GFR 83-96  A complete set of results can be found in Results Review.   Recommendations:  Recommendation to limit salt intake to 2000 mg daily and fluid intake to 64 oz daily.  Encouraged to call if experiencing any fluid symptoms.    Follow-up plan: ICM clinic phone appointment on 04/02/2022.   91 day device clinic remote transmission 04/23/2022     EP/Cardiology Office Visits:  03/19/2022 with Otilio Saber, PA.     Copy of ICM check sent to Dr. Elberta Fortis.     3 month ICM trend: 02/26/2022.    12-14 Month ICM trend:     Karie Soda, RN 02/27/2022 2:26 PM

## 2022-03-06 NOTE — Progress Notes (Signed)
Remote ICD transmission.   

## 2022-03-13 ENCOUNTER — Ambulatory Visit (INDEPENDENT_AMBULATORY_CARE_PROVIDER_SITE_OTHER): Payer: 59 | Admitting: Podiatry

## 2022-03-13 DIAGNOSIS — L84 Corns and callosities: Secondary | ICD-10-CM | POA: Diagnosis not present

## 2022-03-13 DIAGNOSIS — B351 Tinea unguium: Secondary | ICD-10-CM

## 2022-03-13 DIAGNOSIS — M79675 Pain in left toe(s): Secondary | ICD-10-CM | POA: Diagnosis not present

## 2022-03-13 DIAGNOSIS — E0843 Diabetes mellitus due to underlying condition with diabetic autonomic (poly)neuropathy: Secondary | ICD-10-CM

## 2022-03-13 DIAGNOSIS — M79674 Pain in right toe(s): Secondary | ICD-10-CM

## 2022-03-13 DIAGNOSIS — M2041 Other hammer toe(s) (acquired), right foot: Secondary | ICD-10-CM

## 2022-03-13 DIAGNOSIS — M2042 Other hammer toe(s) (acquired), left foot: Secondary | ICD-10-CM

## 2022-03-13 NOTE — Progress Notes (Signed)
Electrophysiology Office Note Date: 03/19/2022  ID:  Donna Hopkins, DOB 01-16-1956, MRN [id number]  PCP: Dennard Schaumann, MD Primary Cardiologist: None Electrophysiologist: Dr. Johney Frame -> Donna Prude, MD   CC: Routine ICD follow-up  Donna Hopkins is a 67 y.o. female seen today for Donna Prude, MD for routine electrophysiology followup. Since last being seen in our clinic the patient reports doing well overall from a cardiac perspective. Still follows with Dr. Nanine Means.  she denies chest pain, palpitations, dyspnea, PND, orthopnea, nausea, vomiting, dizziness, syncope, edema, weight gain, or early satiety.    She has not had ICD shocks.   Device History: St. Jude BiV ICD implanted 2018, lead revision (abandoned lead) 04/2018 for CHF   Past Medical History:  Diagnosis Date   Carotid stenosis, asymptomatic, bilateral    Chronic kidney disease, stage III (moderate) (HCC)    Chronic low back pain    Clinical systolic heart failure, chronic (HCC)    Depression    Diabetes mellitus    Eosinophilic gastritis    GERD (gastroesophageal reflux disease)    Gout, renal disease    H/O mechanical aortic valve replacement    on coumadin   Hx of aortic valve replacement    Hypertension    Hypocalcemia    Hyponatremia    ICD (implantable cardioverter-defibrillator) in place    Medication refill    Mixed hyperlipidemia    Neuropathy    Obesity    OP (osteoporosis)    Peripheral vascular disease, unspecified (HCC)    Persistent insomnia    Personal history of colonic polyps    Pulmonary hypertension (HCC)    Snoring    declines sleep study   Tricuspid regurgitation    Uncontrolled type 2 diabetes mellitus with complication    Valgus foot    Vertigo, benign positional    Vitamin D deficiency    Past Surgical History:  Procedure Laterality Date   ABDOMINAL HYSTERECTOMY     BACK SURGERY     BI-VENTRICULAR IMPLANTABLE CARDIOVERTER DEFIBRILLATOR  (CRT-D)  07/08/2012    SJM Quadra Assura BiV ICD implanted by Dr Reed Pandy at [facility]   CARDIAC VALVE REPLACEMENT     LEAD INSERTION N/A 04/16/2018   Successful BiV ICD system revision with a new RV lead and pulse generator replacement with a [facility] Quadra Assura MP model [id number] ICD    Current Outpatient Medications  Medication Sig Dispense Refill   acetaminophen (TYLENOL) 650 MG CR tablet Take 1,300 mg by mouth daily as needed for pain.     albuterol (PROVENTIL HFA;VENTOLIN HFA) 108 (90 BASE) MCG/ACT inhaler Inhale 2 puffs into the lungs every 6 (six) hours as needed for wheezing or shortness of breath.      ALPRAZolam (XANAX) 0.5 MG tablet Take 0.5 mg by mouth at bedtime as needed for anxiety or sleep.     amoxicillin (AMOXIL) 500 MG tablet Please take 4 tabs an hour before dental procedure. 4 tablet 0   atorvastatin (LIPITOR) 40 MG tablet Take 40 mg by mouth daily.     carvedilol (COREG) 25 MG tablet Take 25 mg by mouth 2 (two) times daily with a meal.     cetirizine (ZYRTEC) 10 MG tablet Take 10 mg by mouth daily.     cyclobenzaprine (FLEXERIL) 10 MG tablet Take 10 mg by mouth 3 (three) times daily.     ferrous sulfate 325 (65 FE) MG tablet Take 325 mg by mouth daily with breakfast.  fluticasone (FLONASE) 50 MCG/ACT nasal spray Place 1 spray into both nostrils daily as needed for allergies.      furosemide (LASIX) 40 MG tablet Take 40 mg by mouth 2 (two) times daily.     gabapentin (NEURONTIN) 300 MG capsule Take 300 mg by mouth 3 (three) times daily.     gentamicin cream (GARAMYCIN) 0.1 % Apply 1 Application topically 2 (two) times daily. 30 g 1   insulin aspart (NOVOLOG) 100 UNIT/ML injection Inject 25 Units into the skin 3 (three) times daily before meals.     JARDIANCE 25 MG TABS tablet Take 1 tablet by mouth daily.     LEVEMIR FLEXTOUCH 100 UNIT/ML Pen Inject 40 Units into the skin 2 (two) times daily.  12   omeprazole (PRILOSEC) 40 MG capsule Take 40 mg by mouth daily.   6    oxyCODONE-acetaminophen (PERCOCET) 10-325 MG per tablet Take 1 tablet by mouth 3 (three) times daily.     OZEMPIC, 0.25 OR 0.5 MG/DOSE, 2 MG/1.5ML SOPN Inject 0.5 mg into the skin every Wednesday.   3   potassium chloride SA (K-DUR,KLOR-CON) 20 MEQ tablet Take 20 mEq by mouth daily.     ramipril (ALTACE) 10 MG capsule Take 10 mg by mouth daily.     sertraline (ZOLOFT) 100 MG tablet Take 100 mg by mouth in the morning and at bedtime.     triamcinolone cream (KENALOG) 0.1 % Apply 1 application topically 2 (two) times daily as needed (dry skin).      warfarin (COUMADIN) 1 MG tablet Take 1 mg by mouth See admin instructions. Take with the 6 mg tablet to equal 7 mg daily in the evening     warfarin (COUMADIN) 6 MG tablet Take 6 mg by mouth See admin instructions. Take with 1 mg tablet to equal 7 mg daily in the evening     No current facility-administered medications for this visit.    Allergies:   Canagliflozin and Tetanus toxoids   Social History: Social History   Socioeconomic History   Marital status: Single    Spouse name: Not on file   Number of children: Not on file   Years of education: Not on file   Highest education level: Not on file  Occupational History   Not on file  Tobacco Use   Smoking status: Former   Smokeless tobacco: Never  Substance and Sexual Activity   Alcohol use: No   Drug use: No   Sexual activity: Never  Other Topics Concern   Not on file  Social History Narrative   ** Merged History Encounter **       Social Determinants of Health   Financial Resource Strain: Medium Risk (06/10/2020)   Overall Financial Resource Strain (CARDIA)    Difficulty of Paying Living Expenses: Somewhat hard  Food Insecurity: No Food Insecurity (06/10/2020)   Hunger Vital Sign    Worried About Running Out of Food in the Last Year: Never true    Ran Out of Food in the Last Year: Never true  Transportation Needs: Not on file  Physical Activity: Not on file  Stress: Not on file   Social Connections: Not on file  Intimate Partner Violence: Not on file    Family History: Family History  Problem Relation Age of Onset   Heart disease Father    Hypertension Father    Anxiety disorder Sister    Diabetes Mellitus II Sister    Hypertension Sister    Colon  polyps Sister    Hyperlipidemia Sister    Anxiety disorder Brother    Heart disease Brother    Depression Brother    Hyperlipidemia Brother     Review of Systems: All other systems reviewed and are otherwise negative except as noted above.   Physical Exam: Vitals:   03/19/22 1123  BP: 138/78  SpO2: 98%  Weight: 225 lb 3.2 oz (102.2 kg)  Height: 5' 9.5" (1.765 m)     GEN- The patient is well appearing, alert and oriented x 3 today.   HEENT: normocephalic, atraumatic; sclera clear, conjunctiva pink; hearing intact; oropharynx clear; neck supple, no JVP Lymph- no cervical lymphadenopathy Lungs- Clear to ausculation bilaterally, normal work of breathing.  No wheezes, rales, rhonchi Heart- Regular  rate and rhythm, no murmurs, rubs or gallops, PMI not laterally displaced GI- soft, non-tender, non-distended, bowel sounds present, no hepatosplenomegaly Extremities- no clubbing or cyanosis. No peripheral edema; DP/PT/radial pulses 2+ bilaterally MS- no significant deformity or atrophy Skin- warm and dry, no rash or lesion; ICD pocket well healed Psych- euthymic mood, full affect Neuro- strength and sensation are intact  ICD interrogation- reviewed in detail today,  See PACEART report  EKG:  EKG is ordered today. Personal review of EKG ordered today shows AS/VP at 79 bpm  Recent Labs: No results found for requested labs within last 365 days.   Wt Readings from Last 3 Encounters:  03/19/22 225 lb 3.2 oz (102.2 kg)  05/22/21 237 lb (107.5 kg)  11/12/20 231 lb (104.8 kg)     Other studies Reviewed: Additional studies/ records that were reviewed today include: Previous EP office notes.   Assessment  and Plan:  1.  Chronic systolic dysfunction s/p St. Jude CRT-D  euvolemic today Stable on an appropriate medical regimen Normal ICD function See Pace Art report No changes today  2. S/p AVR Followed by Dr. Margette Fast 05/2021  Current medicines are reviewed at length with the patient today.    Labs/ tests ordered today include:  Orders Placed This Encounter  Procedures   CUP [place]   EKG 12-Lead    Disposition:   Follow up with Dr. Quentin Ore in 6 months    Signed, Shirley Friar, PA-C  03/19/2022 12:02 PM  [place] [address] [place] [address] [phone] (office) [phone] (fax)

## 2022-03-14 NOTE — Progress Notes (Signed)
  Subjective:  Patient ID: Donna Hopkins, female    DOB: 11/12/1955,  MRN: [id number]  Chief Complaint  Patient presents with   Nail Problem    Thick painful toenails, 3 month follow up - diabetic foot care/patient states that her sugar has been running high lately/ left 2nd toe sometimes bleeds/ (req different provider)    67 y.o. female presents with the above complaint. History confirmed with patient.  She does her A1c is "bad" but does not know the exact number.  She has painful calluses on both feet.  Nails are also thickened elongated causing pain discomfort.  Objective:  Physical Exam: warm, good capillary refill, no trophic changes or ulcerative lesions, normal DP and PT pulses, and abnormal sensory exam with loss of protective sensation at the toes, she has hyperkeratotic lesions on the bilateral fifth toe PIPJ, tip of the second and third toe on the left, painful thick elongated nails with supple debris and yellow discoloration x 10.  Assessment:  No diagnosis found.   Plan:  Patient was evaluated and treated and all questions answered.  Patient educated on diabetes. Discussed proper diabetic foot care and discussed risks and complications of disease. Educated patient in depth on reasons to return to the office immediately should he/she discover anything concerning or new on the feet. All questions answered. Discussed proper shoes as well.  She will be scheduled for diabetic shoe fitting  Discussed the etiology and treatment options for the condition in detail with the patient. Educated patient on the topical and oral treatment options for mycotic nails. Recommended debridement of the nails today. Sharp and mechanical debridement performed of all painful and mycotic nails today. Nails debrided in length and thickness using a nail nipper to level of comfort. Discussed treatment options including appropriate shoe gear. Follow up as needed for painful nails.  All symptomatic  hyperkeratoses were safely debrided with a sterile #15 blade to patient's level of comfort without incident. We discussed preventative and palliative care of these lesions including supportive and accommodative shoegear, padding, prefabricated and custom molded accommodative orthoses, use of a pumice stone and lotions/creams daily.   Return in about 3 months (around 06/12/2022) for at risk diabetic foot care.

## 2022-03-19 ENCOUNTER — Encounter: Payer: Self-pay | Admitting: Student

## 2022-03-19 ENCOUNTER — Ambulatory Visit: Payer: 59 | Attending: Student | Admitting: Student

## 2022-03-19 VITALS — BP 138/78 | Ht 69.5 in | Wt 225.2 lb

## 2022-03-19 DIAGNOSIS — I359 Nonrheumatic aortic valve disorder, unspecified: Secondary | ICD-10-CM

## 2022-03-19 DIAGNOSIS — I5022 Chronic systolic (congestive) heart failure: Secondary | ICD-10-CM

## 2022-03-19 LAB — CUP PACEART INCLINIC DEVICE CHECK
Battery Remaining Longevity: 31 mo
Brady Statistic RA Percent Paced: 0.04 %
Brady Statistic RV Percent Paced: 99.19 %
Date Time Interrogation Session: 20240108115907
HighPow Impedance: 75.375
Implantable Lead Connection Status: 753985
Implantable Lead Connection Status: 753985
Implantable Lead Connection Status: 753985
Implantable Lead Implant Date: 20140429
Implantable Lead Implant Date: 20140617
Implantable Lead Implant Date: 20200205
Implantable Lead Location: 753858
Implantable Lead Location: 753859
Implantable Lead Location: 753860
Implantable Pulse Generator Implant Date: 20200205
Lead Channel Impedance Value: 450 Ohm
Lead Channel Impedance Value: 475 Ohm
Lead Channel Impedance Value: 712.5 Ohm
Lead Channel Pacing Threshold Amplitude: 0.75 V
Lead Channel Pacing Threshold Amplitude: 0.75 V
Lead Channel Pacing Threshold Amplitude: 0.75 V
Lead Channel Pacing Threshold Amplitude: 1.875 V
Lead Channel Pacing Threshold Pulse Width: 0.5 ms
Lead Channel Pacing Threshold Pulse Width: 0.5 ms
Lead Channel Pacing Threshold Pulse Width: 0.5 ms
Lead Channel Pacing Threshold Pulse Width: 0.5 ms
Lead Channel Sensing Intrinsic Amplitude: 12 mV
Lead Channel Sensing Intrinsic Amplitude: 3.6 mV
Lead Channel Setting Pacing Amplitude: 2 V
Lead Channel Setting Pacing Amplitude: 2.5 V
Lead Channel Setting Pacing Amplitude: 2.875
Lead Channel Setting Pacing Pulse Width: 0.5 ms
Lead Channel Setting Pacing Pulse Width: 0.5 ms
Lead Channel Setting Sensing Sensitivity: 0.5 mV
Pulse Gen Serial Number: 9879509

## 2022-03-19 NOTE — Patient Instructions (Signed)
Medication Instructions:  Your physician recommends that you continue on your current medications as directed. Please refer to the Current Medication list given to you today.  *If you need a refill on your cardiac medications before your next appointment, please call your pharmacy*   Lab Work: None If you have labs (blood work) drawn today and your tests are completely normal, you will receive your results only by: MyChart Message (if you have MyChart) OR A paper copy in the mail If you have any lab test that is abnormal or we need to change your treatment, we will call you to review the results.   Follow-Up: At [facility], you and your health needs are our priority.  As part of our continuing mission to provide you with exceptional heart care, we have created designated Provider Care Teams.  These Care Teams include your primary Cardiologist (physician) and Advanced Practice Providers (APPs -  Physician Assistants and Nurse Practitioners) who all work together to provide you with the care you need, when you need it.   Your next appointment:   6 month(s)  The format for your next appointment:   In Person  Provider:   Cameron Lambert, MD    Important Information About Sugar       

## 2022-04-02 ENCOUNTER — Ambulatory Visit: Payer: 59 | Attending: Cardiology

## 2022-04-02 DIAGNOSIS — I5022 Chronic systolic (congestive) heart failure: Secondary | ICD-10-CM | POA: Diagnosis not present

## 2022-04-02 DIAGNOSIS — Z95810 Presence of automatic (implantable) cardiac defibrillator: Secondary | ICD-10-CM | POA: Diagnosis not present

## 2022-04-04 ENCOUNTER — Telehealth: Payer: Self-pay

## 2022-04-04 NOTE — Progress Notes (Signed)
EPIC Encounter for ICM Monitoring  Patient Name: Donna Hopkins is a 67 y.o. female Date: 04/04/2022 Primary Care Physican: Lorelei Pont, MD Primary Cardiologist: Claudie Leach Electrophysiologist: Marisa Sprinkles Pacing: >99%         05/01/2021 Weight: 229 lbs  02/19/2022 Weight: 223 lbs   AT/AF Burden:  <1% (takes Warfarin)                                                                  Attempted call to patient and unable to reach.   Transmission reviewed.              Diet:  Does not follow low salt diet.  Eats a lot of ice and drinks > 64 oz daily   Corvue Thoracic impedance suggesting normal fluid levels.    Prescribed: Furosemide 40 mg Take 1 tablet (40 mg total) by mouth daily.  She reports 02/12/22, she is taking Furosemide 40 mg twice as ordered by PCP.   Potassium 20 mEq take 1 tablet daily.   Labs: 04/04/2021 Creatinine 0.96, BUN 11, Potassium 3.8, Sodium 136 11/12/2020 Creatinine 0.75, BUN 17, Potassium 4.0, Sodium 134, GFR >60 04/17/2018 Creatinine 0.74, BUN 10, Potassium 4.2, Sodium 132, GFR >60 04/14/2018 Creatinine 0.76, BUN 9,   Potassium 4.0, Sodium 135, GFR 83-96  A complete set of results can be found in Results Review.   Recommendations:  Unable to reach.     Follow-up plan: ICM clinic phone appointment on 05/07/2022.   91 day device clinic remote transmission 04/23/2022     EP/Cardiology Office Visits:  Recall 09/15/2022 with Dr Quentin Ore.     Copy of ICM check sent to Dr. Quentin Ore.     3 month ICM trend: 04/02/2022.    12-14 Month ICM trend:     Rosalene Billings, RN 04/04/2022 12:23 PM

## 2022-04-04 NOTE — Telephone Encounter (Signed)
Remote ICM transmission received.  Attempted call to patient regarding ICM remote transmission and left detailed message per DPR.  Advised to return call for any fluid symptoms or questions. Next ICM remote transmission scheduled 05/07/2022.    

## 2022-04-23 ENCOUNTER — Ambulatory Visit (INDEPENDENT_AMBULATORY_CARE_PROVIDER_SITE_OTHER): Payer: 59

## 2022-04-23 DIAGNOSIS — I5022 Chronic systolic (congestive) heart failure: Secondary | ICD-10-CM

## 2022-04-24 LAB — CUP PACEART REMOTE DEVICE CHECK
Battery Remaining Longevity: 30 mo
Battery Remaining Percentage: 41 %
Battery Voltage: 2.92 V
Brady Statistic AP VP Percent: 1 %
Brady Statistic AP VS Percent: 1 %
Brady Statistic AS VP Percent: 99 %
Brady Statistic AS VS Percent: 1 %
Brady Statistic RA Percent Paced: 1 %
Date Time Interrogation Session: 20240212020016
HighPow Impedance: 77 Ohm
HighPow Impedance: 77 Ohm
Implantable Lead Connection Status: 753985
Implantable Lead Connection Status: 753985
Implantable Lead Connection Status: 753985
Implantable Lead Implant Date: 20140429
Implantable Lead Implant Date: 20140617
Implantable Lead Implant Date: 20200205
Implantable Lead Location: 753858
Implantable Lead Location: 753859
Implantable Lead Location: 753860
Implantable Pulse Generator Implant Date: 20200205
Lead Channel Impedance Value: 460 Ohm
Lead Channel Impedance Value: 540 Ohm
Lead Channel Impedance Value: 750 Ohm
Lead Channel Pacing Threshold Amplitude: 0.75 V
Lead Channel Pacing Threshold Amplitude: 0.75 V
Lead Channel Pacing Threshold Amplitude: 2.75 V
Lead Channel Pacing Threshold Pulse Width: 0.5 ms
Lead Channel Pacing Threshold Pulse Width: 0.5 ms
Lead Channel Pacing Threshold Pulse Width: 0.5 ms
Lead Channel Sensing Intrinsic Amplitude: 12 mV
Lead Channel Sensing Intrinsic Amplitude: 4.5 mV
Lead Channel Setting Pacing Amplitude: 2 V
Lead Channel Setting Pacing Amplitude: 2.5 V
Lead Channel Setting Pacing Amplitude: 3.75 V
Lead Channel Setting Pacing Pulse Width: 0.5 ms
Lead Channel Setting Pacing Pulse Width: 0.5 ms
Lead Channel Setting Sensing Sensitivity: 0.5 mV
Pulse Gen Serial Number: 9879509

## 2022-05-07 ENCOUNTER — Ambulatory Visit: Payer: 59 | Attending: Cardiology

## 2022-05-07 DIAGNOSIS — I5022 Chronic systolic (congestive) heart failure: Secondary | ICD-10-CM | POA: Diagnosis not present

## 2022-05-07 DIAGNOSIS — Z95810 Presence of automatic (implantable) cardiac defibrillator: Secondary | ICD-10-CM

## 2022-05-11 NOTE — Progress Notes (Signed)
EPIC Encounter for ICM Monitoring  Patient Name: Donna Hopkins is a 67 y.o. female Date: 05/11/2022 Primary Care Physican: Lorelei Pont, MD Primary Cardiologist: Claudie Leach Electrophysiologist: Marisa Sprinkles Pacing: >99%         05/01/2021 Weight: 229 lbs  02/19/2022 Weight: 223 lbs   AT/AF Burden:  0% (takes Warfarin)                                                                  Transmission reviewed.              Diet:  Does not follow low salt diet.  Eats a lot of ice and drinks > 64 oz daily   Corvue Thoracic impedance suggesting fluid levels alternating from possible fluid accumulation possible dryness within the last month.    Prescribed: Furosemide 40 mg Take 1 tablet (40 mg total) by mouth daily.  She reports 02/12/22, she is taking Furosemide 40 mg twice as ordered by PCP.   Potassium 20 mEq take 1 tablet daily.   Labs: 04/04/2021 Creatinine 0.96, BUN 11, Potassium 3.8, Sodium 136 A complete set of results can be found in Results Review.   Recommendations:  No changes.    Follow-up plan: ICM clinic phone appointment on 06/11/2022.   91 day device clinic remote transmission 07/23/2022     EP/Cardiology Office Visits:  Recall 09/15/2022 with Dr Quentin Ore.     Copy of ICM check sent to Dr. Quentin Ore.      3 month ICM trend: 05/07/2022.    12-14 Month ICM trend:     Rosalene Billings, RN 05/11/2022 2:35 PM

## 2022-05-30 NOTE — Telephone Encounter (Signed)
No further evaluation required

## 2022-06-06 NOTE — Progress Notes (Signed)
Remote ICD transmission.   

## 2022-06-10 ENCOUNTER — Other Ambulatory Visit: Payer: Self-pay

## 2022-06-10 ENCOUNTER — Encounter (HOSPITAL_BASED_OUTPATIENT_CLINIC_OR_DEPARTMENT_OTHER): Payer: Self-pay | Admitting: Emergency Medicine

## 2022-06-10 ENCOUNTER — Emergency Department (HOSPITAL_BASED_OUTPATIENT_CLINIC_OR_DEPARTMENT_OTHER): Payer: 59

## 2022-06-10 ENCOUNTER — Emergency Department (HOSPITAL_BASED_OUTPATIENT_CLINIC_OR_DEPARTMENT_OTHER)
Admission: EM | Admit: 2022-06-10 | Discharge: 2022-06-10 | Disposition: A | Discharge status: Home / Self Care | Payer: 59 | Attending: Emergency Medicine | Admitting: Emergency Medicine

## 2022-06-10 DIAGNOSIS — S99922A Unspecified injury of left foot, initial encounter: Secondary | ICD-10-CM | POA: Insufficient documentation

## 2022-06-10 DIAGNOSIS — Z7984 Long term (current) use of oral hypoglycemic drugs: Secondary | ICD-10-CM | POA: Insufficient documentation

## 2022-06-10 DIAGNOSIS — M549 Dorsalgia, unspecified: Secondary | ICD-10-CM | POA: Diagnosis not present

## 2022-06-10 DIAGNOSIS — S79912A Unspecified injury of left hip, initial encounter: Secondary | ICD-10-CM | POA: Diagnosis present

## 2022-06-10 DIAGNOSIS — W19XXXA Unspecified fall, initial encounter: Secondary | ICD-10-CM | POA: Insufficient documentation

## 2022-06-10 DIAGNOSIS — R600 Localized edema: Secondary | ICD-10-CM | POA: Diagnosis not present

## 2022-06-10 DIAGNOSIS — E119 Type 2 diabetes mellitus without complications: Secondary | ICD-10-CM | POA: Diagnosis not present

## 2022-06-10 DIAGNOSIS — Z794 Long term (current) use of insulin: Secondary | ICD-10-CM | POA: Insufficient documentation

## 2022-06-10 DIAGNOSIS — N189 Chronic kidney disease, unspecified: Secondary | ICD-10-CM | POA: Diagnosis not present

## 2022-06-10 DIAGNOSIS — Z79899 Other long term (current) drug therapy: Secondary | ICD-10-CM | POA: Diagnosis not present

## 2022-06-10 DIAGNOSIS — Z95810 Presence of automatic (implantable) cardiac defibrillator: Secondary | ICD-10-CM | POA: Insufficient documentation

## 2022-06-10 DIAGNOSIS — I129 Hypertensive chronic kidney disease with stage 1 through stage 4 chronic kidney disease, or unspecified chronic kidney disease: Secondary | ICD-10-CM | POA: Insufficient documentation

## 2022-06-10 DIAGNOSIS — Z7901 Long term (current) use of anticoagulants: Secondary | ICD-10-CM | POA: Insufficient documentation

## 2022-06-10 DIAGNOSIS — M7918 Myalgia, other site: Secondary | ICD-10-CM

## 2022-06-10 LAB — CBC
HCT: 35.4 % — ABNORMAL LOW (ref 36.0–46.0)
Hemoglobin: 11.7 g/dL — ABNORMAL LOW (ref 12.0–15.0)
MCH: 31.5 pg (ref 26.0–34.0)
MCHC: 33.1 g/dL (ref 30.0–36.0)
MCV: 95.4 fL (ref 80.0–100.0)
Platelets: 248 10*3/uL (ref 150–400)
RBC: 3.71 MIL/uL — ABNORMAL LOW (ref 3.87–5.11)
RDW: 13.3 % (ref 11.5–15.5)
WBC: 6.9 10*3/uL (ref 4.0–10.5)
nRBC: 0 % (ref 0.0–0.2)

## 2022-06-10 LAB — BASIC METABOLIC PANEL
Anion gap: 8 (ref 5–15)
BUN: 13 mg/dL (ref 8–23)
CO2: 25 mmol/L (ref 22–32)
Calcium: 8.6 mg/dL — ABNORMAL LOW (ref 8.9–10.3)
Chloride: 100 mmol/L (ref 98–111)
Creatinine, Ser: 0.76 mg/dL (ref 0.44–1.00)
GFR, Estimated: >60 mL/min (ref >60)
Glucose, Bld: 156 mg/dL — ABNORMAL HIGH (ref 70–99)
Potassium: 3.5 mmol/L (ref 3.5–5.1)
Sodium: 133 mmol/L — ABNORMAL LOW (ref 135–145)

## 2022-06-10 LAB — BRAIN NATRIURETIC PEPTIDE: B Natriuretic Peptide: 75.8 pg/mL (ref 0.0–100.0)

## 2022-06-10 LAB — GLUCOSE, CAPILLARY: Glucose-Capillary: 161 mg/dL — ABNORMAL HIGH (ref 70–99)

## 2022-06-10 NOTE — ED Notes (Signed)
D/c paperwork reviewed with pt, including follow up care.  No questions or concerns voiced at time of d/c. . Pt verbalized understanding, Wheeled by ED staff to ED exit, NAD.   

## 2022-06-10 NOTE — ED Triage Notes (Addendum)
Pt here for L leg pain x2 months that has been progressively getting worse. Pt also has black spot on medial side of L foot, states she noticed it last week w/ swelling in bilateral feet. Pt also reports "when I feel up my vagina I feel a bone in there". Hx DM, chf

## 2022-06-10 NOTE — ED Notes (Signed)
Pt transported to imaging.

## 2022-06-10 NOTE — Discharge Instructions (Signed)
Your history, exam, and workup today are consistent with likely musculoskeletal pain after your recurrent fall again.  The x-rays of your foot, hips, low back, and chest did not show evidence of acute traumatic injury but shows extensive arthritis as we discussed.  No evidence of pneumonia.  Your labs were overall reassuring.  We did a pelvic exam and did not feel any abnormality however due to your concern with abnormal sensation, it is reasonable to follow-up with OB/GYN team for follow-up.  Please follow-up with sports medicine or neurosurgery for the muscle skeletal and back pains you have been having and PCP for the neuropathy and foot troubles.  Will also include numbers for podiatry and neurology teams.  If any symptoms change or worsen acutely, please return to the nearest emergency department.

## 2022-06-10 NOTE — ED Provider Notes (Signed)
[facility] Provider Note   CSN: [id number] Arrival date & time: 06/10/22  2032     History {Add pertinent medical, surgical, social history, OB history to HPI:1} Chief Complaint  Patient presents with   Leg Pain   Foot Pain    Donna Hopkins is a 67 y.o. female.   Leg Pain Foot Pain       Home Medications Prior to Admission medications   Medication Sig Start Date End Date Taking? Authorizing Provider  acetaminophen (TYLENOL) 650 MG CR tablet Take 1,300 mg by mouth daily as needed for pain.    [provider]  albuterol (PROVENTIL HFA;VENTOLIN HFA) 108 (90 BASE) MCG/ACT inhaler Inhale 2 puffs into the lungs every 6 (six) hours as needed for wheezing or shortness of breath.     [provider]  ALPRAZolam Duanne Moron) 0.5 MG tablet Take 0.5 mg by mouth at bedtime as needed for anxiety or sleep.    [provider]  amoxicillin (AMOXIL) 500 MG tablet Please take 4 tabs an hour before dental procedure. 11/28/21   Elgie Collard, PA-C  atorvastatin (LIPITOR) 40 MG tablet Take 40 mg by mouth daily.    [provider]  carvedilol (COREG) 25 MG tablet Take 25 mg by mouth 2 (two) times daily with a meal.    [provider]  cetirizine (ZYRTEC) 10 MG tablet Take 10 mg by mouth daily.    [provider]  cyclobenzaprine (FLEXERIL) 10 MG tablet Take 10 mg by mouth 3 (three) times daily.    [provider]  ferrous sulfate 325 (65 FE) MG tablet Take 325 mg by mouth daily with breakfast.    [provider]  fluticasone (FLONASE) 50 MCG/ACT nasal spray Place 1 spray into both nostrils daily as needed for allergies.     [provider]  furosemide (LASIX) 40 MG tablet Take 40 mg by mouth 2 (two) times daily. 01/03/22   [provider]  gabapentin (NEURONTIN) 300 MG capsule Take 300 mg by mouth 3 (three) times daily.    [provider]  gentamicin cream  (GARAMYCIN) 0.1 % Apply 1 Application topically 2 (two) times daily. 10/23/21   Edrick Kins, DPM  insulin aspart (NOVOLOG) 100 UNIT/ML injection Inject 25 Units into the skin 3 (three) times daily before meals.    [provider]  JARDIANCE 25 MG TABS tablet Take 1 tablet by mouth daily. 05/30/18   [provider]  LEVEMIR FLEXTOUCH 100 UNIT/ML Pen Inject 40 Units into the skin 2 (two) times daily. 01/04/18   [provider]  omeprazole (PRILOSEC) 40 MG capsule Take 40 mg by mouth daily.  01/16/18   [provider]  oxyCODONE-acetaminophen (PERCOCET) 10-325 MG per tablet Take 1 tablet by mouth 3 (three) times daily.    [provider]  OZEMPIC, 0.25 OR 0.5 MG/DOSE, 2 MG/1.5ML SOPN Inject 0.5 mg into the skin every Wednesday.  12/30/17   [provider]  potassium chloride SA (K-DUR,KLOR-CON) 20 MEQ tablet Take 20 mEq by mouth daily.    [provider]  ramipril (ALTACE) 10 MG capsule Take 10 mg by mouth daily.    [provider]  sertraline (ZOLOFT) 100 MG tablet Take 100 mg by mouth in the morning and at bedtime.    [provider]  triamcinolone cream (KENALOG) 0.1 % Apply 1 application topically 2 (two) times daily as needed (dry skin).     [provider]  warfarin (COUMADIN) 1 MG tablet Take 1 mg by mouth See admin instructions. Take with the 6 mg tablet to equal 7 mg daily in the evening    [provider]  warfarin (COUMADIN) 6 MG tablet Take 6 mg by mouth See admin instructions. Take with 1 mg tablet to equal 7 mg daily in the evening 10/20/15   [provider]      Allergies    Canagliflozin and Tetanus toxoids    Review of Systems   Review of Systems  Physical Exam Updated Vital Signs BP (!) 140/65 (BP Location: Right Arm)   Pulse 83   Temp 98.1 F (36.7 C) (Oral)   Resp 18   SpO2 96%  Physical Exam  ED Results / Procedures / Treatments   Labs (all labs ordered are  listed, but only abnormal results are displayed) Labs Reviewed  GLUCOSE, CAPILLARY - Abnormal; Notable for the following components:      Result Value   Glucose-Capillary 161 (*)    All other components within normal limits  BASIC METABOLIC PANEL  CBC  BRAIN NATRIURETIC PEPTIDE    EKG None  Radiology No results found.  Procedures Procedures  {Document cardiac monitor, telemetry assessment procedure when appropriate:1}  Medications Ordered in ED Medications - No data to display  ED Course/ Medical Decision Making/ A&P   {   Click here for ABCD2, HEART and other calculatorsREFRESH Note before signing :1}                          Medical Decision Making Amount and/or Complexity of Data Reviewed Labs: ordered. Radiology: ordered.   ***  {Document critical care time when appropriate:1} {Document review of labs and clinical decision tools ie heart score, Chads2Vasc2 etc:1}  {Document your independent review of radiology images, and any outside records:1} {Document your discussion with family members, caretakers, and with consultants:1} {Document social determinants of health affecting pt's care:1} {Document your decision making why or why not admission, treatments were needed:1} Final Clinical Impression(s) / ED Diagnoses Final diagnoses:  None    Rx / DC Orders ED Discharge Orders     None

## 2022-06-11 ENCOUNTER — Ambulatory Visit: Payer: 59 | Attending: Cardiology

## 2022-06-11 DIAGNOSIS — Z95810 Presence of automatic (implantable) cardiac defibrillator: Secondary | ICD-10-CM | POA: Diagnosis not present

## 2022-06-11 DIAGNOSIS — I5022 Chronic systolic (congestive) heart failure: Secondary | ICD-10-CM | POA: Diagnosis not present

## 2022-06-11 NOTE — ED Provider Notes (Incomplete)
[facility] Provider Note   CSN: [id number] Arrival date & time: 06/10/22  2032     History {Add pertinent medical, surgical, social history, OB history to HPI:1} Chief Complaint  Patient presents with  . Leg Pain  . Foot Pain    Donna Hopkins is a 67 y.o. female.   Leg Pain Foot Pain       Home Medications Prior to Admission medications   Medication Sig Start Date End Date Taking? Authorizing Provider  acetaminophen (TYLENOL) 650 MG CR tablet Take 1,300 mg by mouth daily as needed for pain.    [provider]  albuterol (PROVENTIL HFA;VENTOLIN HFA) 108 (90 BASE) MCG/ACT inhaler Inhale 2 puffs into the lungs every 6 (six) hours as needed for wheezing or shortness of breath.     [provider]  ALPRAZolam Duanne Moron) 0.5 MG tablet Take 0.5 mg by mouth at bedtime as needed for anxiety or sleep.    [provider]  amoxicillin (AMOXIL) 500 MG tablet Please take 4 tabs an hour before dental procedure. 11/28/21   Donna Collard, PA-C  atorvastatin (LIPITOR) 40 MG tablet Take 40 mg by mouth daily.    [provider]  carvedilol (COREG) 25 MG tablet Take 25 mg by mouth 2 (two) times daily with a meal.    [provider]  cetirizine (ZYRTEC) 10 MG tablet Take 10 mg by mouth daily.    [provider]  cyclobenzaprine (FLEXERIL) 10 MG tablet Take 10 mg by mouth 3 (three) times daily.    [provider]  ferrous sulfate 325 (65 FE) MG tablet Take 325 mg by mouth daily with breakfast.    [provider]  fluticasone (FLONASE) 50 MCG/ACT nasal spray Place 1 spray into both nostrils daily as needed for allergies.     [provider]  furosemide (LASIX) 40 MG tablet Take 40 mg by mouth 2 (two) times daily. 01/03/22   [provider]  gabapentin (NEURONTIN) 300 MG capsule Take 300 mg by mouth 3 (three) times daily.    [provider]  gentamicin cream  (GARAMYCIN) 0.1 % Apply 1 Application topically 2 (two) times daily. 10/23/21   Donna Hopkins, DPM  insulin aspart (NOVOLOG) 100 UNIT/ML injection Inject 25 Units into the skin 3 (three) times daily before meals.    [provider]  JARDIANCE 25 MG TABS tablet Take 1 tablet by mouth daily. 05/30/18   [provider]  LEVEMIR FLEXTOUCH 100 UNIT/ML Pen Inject 40 Units into the skin 2 (two) times daily. 01/04/18   [provider]  omeprazole (PRILOSEC) 40 MG capsule Take 40 mg by mouth daily.  01/16/18   [provider]  oxyCODONE-acetaminophen (PERCOCET) 10-325 MG per tablet Take 1 tablet by mouth 3 (three) times daily.    [provider]  OZEMPIC, 0.25 OR 0.5 MG/DOSE, 2 MG/1.5ML SOPN Inject 0.5 mg into the skin every Wednesday.  12/30/17   [provider]  potassium chloride SA (K-DUR,KLOR-CON) 20 MEQ tablet Take 20 mEq by mouth daily.    [provider]  ramipril (ALTACE) 10 MG capsule Take 10 mg by mouth daily.    [provider]  sertraline (ZOLOFT) 100 MG tablet Take 100 mg by mouth in the morning and at bedtime.    [provider]  triamcinolone cream (KENALOG) 0.1 % Apply 1 application topically 2 (two) times daily as needed (dry skin).     [provider]  warfarin (COUMADIN) 1 MG tablet Take 1 mg by mouth See admin instructions. Take with the 6 mg tablet to equal 7 mg daily in the evening    [provider]  warfarin (COUMADIN) 6 MG tablet Take 6 mg by mouth See admin instructions. Take with 1 mg tablet to equal 7 mg daily in the evening 10/20/15   [provider]      Allergies    Canagliflozin and Tetanus toxoids    Review of Systems   Review of Systems  Physical Exam Updated Vital Signs BP (!) 140/65 (BP Location: Right Arm)   Pulse 83   Temp 98.1 F (36.7 C) (Oral)   Resp 18   SpO2 96%  Physical Exam  ED Results / Procedures / Treatments   Labs (all labs ordered are  listed, but only abnormal results are displayed) Labs Reviewed  GLUCOSE, CAPILLARY - Abnormal; Notable for the following components:      Result Value   Glucose-Capillary 161 (*)    All other components within normal limits  BASIC METABOLIC PANEL  CBC  BRAIN NATRIURETIC PEPTIDE    EKG None  Radiology No results found.  Procedures Procedures  {Document cardiac monitor, telemetry assessment procedure when appropriate:1}  Medications Ordered in ED Medications - No data to display  ED Course/ Medical Decision Making/ A&P   {   Click here for ABCD2, HEART and other calculatorsREFRESH Note before signing :1}                          Medical Decision Making Amount and/or Complexity of Data Reviewed Labs: ordered. Radiology: ordered.    Donna Hopkins is a 67 y.o. female      {Document critical care time when appropriate:1} {Document review of labs and clinical decision tools ie heart score, Chads2Vasc2 etc:1}  {Document your independent review of radiology images, and any outside records:1} {Document your discussion with family members, caretakers, and with consultants:1} {Document social determinants of health affecting pt's care:1} {Document your decision making why or why not admission, treatments were needed:1} Final Clinical Impression(s) / ED Diagnoses Final diagnoses:  None    Rx / DC Orders ED Discharge Orders     None

## 2022-06-12 ENCOUNTER — Telehealth: Payer: Self-pay

## 2022-06-12 NOTE — Progress Notes (Signed)
EPIC Encounter for ICM Monitoring  Patient Name: Donna Hopkins is a 67 y.o. female Date: 06/12/2022 Primary Care Physican: Lorelei Pont, MD Primary Cardiologist: Claudie Leach Electrophysiologist: Marisa Sprinkles Pacing: >99%         03/19/2022 Office Weight: 225 lbs   AT/AF Burden:  0% (takes Warfarin)                                                                  Attempted call to patient and unable to reach.  Transmission reviewed.  ED visit 3/31 for fall             Diet:  Does not follow low salt diet.  Eats a lot of ice and drinks > 64 oz daily   Corvue Thoracic impedance suggesting intermittent days with possible fluid accumulation within the last month and trending back toward baseline 4/1.    Prescribed: Furosemide 40 mg Take 1 tablet (40 mg total) by mouth daily.  She reports 02/12/22, she is taking Furosemide 40 mg twice as ordered by PCP.   Potassium 20 mEq take 1 tablet daily.   Labs: 06/10/2022 Creatinine 0.76, BUN 13, Potassium 3.5, Sodium 133, GFR >60 A complete set of results can be found in Results Review.   Recommendations:   Unable to reach.     Follow-up plan: ICM clinic phone appointment on 07/16/2022.   91 day device clinic remote transmission 07/23/2022     EP/Cardiology Office Visits:  Recall 09/15/2022 with Dr Quentin Ore.     Copy of ICM check sent to Dr. Quentin Ore.      3 month ICM trend: 06/11/2022.    12-14 Month ICM trend:     Rosalene Billings, RN 06/12/2022 10:46 AM

## 2022-06-12 NOTE — Telephone Encounter (Signed)
Remote ICM transmission received.  Attempted call to patient regarding ICM remote transmission and no answer.  

## 2022-06-14 ENCOUNTER — Ambulatory Visit: Payer: 59 | Admitting: Podiatry

## 2022-06-15 ENCOUNTER — Ambulatory Visit (INDEPENDENT_AMBULATORY_CARE_PROVIDER_SITE_OTHER): Payer: 59 | Admitting: Podiatry

## 2022-06-15 DIAGNOSIS — B351 Tinea unguium: Secondary | ICD-10-CM | POA: Diagnosis not present

## 2022-06-15 DIAGNOSIS — L97522 Non-pressure chronic ulcer of other part of left foot with fat layer exposed: Secondary | ICD-10-CM | POA: Diagnosis not present

## 2022-06-15 DIAGNOSIS — M79674 Pain in right toe(s): Secondary | ICD-10-CM | POA: Diagnosis not present

## 2022-06-15 DIAGNOSIS — M79675 Pain in left toe(s): Secondary | ICD-10-CM

## 2022-06-15 DIAGNOSIS — L84 Corns and callosities: Secondary | ICD-10-CM

## 2022-06-15 DIAGNOSIS — E1149 Type 2 diabetes mellitus with other diabetic neurological complication: Secondary | ICD-10-CM

## 2022-06-15 MED ORDER — MUPIROCIN 2 % EX OINT: 1.0000 | TOPICAL_OINTMENT | Freq: Two times a day (BID) | CUTANEOUS | 2 refills | Status: DC

## 2022-06-15 MED ORDER — CEPHALEXIN 500 MG PO CAPS: 500.0000 mg | ORAL_CAPSULE | Freq: Three times a day (TID) | ORAL | 0 refills | Status: DC

## 2022-06-15 NOTE — Progress Notes (Unsigned)
Subjective: Chief Complaint  Patient presents with   Nail Problem    Thick painful toenails, 3 month follow up   67 year old female presents the office today with above concerns.  She is noticing the area of the left foot when the submetatarsal 1 with a area has become "black'.  She is try to see in the area send she had calluses and she is not sure if deep.  She does not see any drainage or pus.  Last A1c 6.3 on 04/05/2021  Objective: AAO x3, NAD DP/PT pulses palpable bilaterally, CRT less than 3 seconds Sensation decreased with Semmes Weinstein monofilament. Nails are hypertrophic, dystrophic, brittle, discolored, elongated 10. No surrounding redness or drainage. Tenderness nails 1-5 bilaterally.  Thick hyperkeratotic lesion with dried blood present left foot submetatarsal 1.  Upon debridement there is granular wound measuring 0.3 x 0.3 x 0.2 cm without any probing to bone or tunneling.  There is no fluctuance or crepitation but there is no malodor.   No pain with calf compression, swelling, warmth, erythema  Assessment: 67 year old female with symptomatic onychomycosis, ulceration left foot  Plan: -All treatment options discussed with the patient including all alternatives, risks, complications.  -Nails sharply debrided x 10 without any complications or bleeding -Medically necessary wound debridement was performed on the left foot.  See procedure note below.  I recommend continue with daily dressing changes.  Prescribed mupirocin ointment -Keflex -If no improvement order arterial studies and x-ray next appointment  -She would likely benefit from diabetic shoes.  Order placed for this to hopefully help offload and prevent further ulceration or skin breakdown. -Patient encouraged to call the office with any questions, concerns, change in symptoms.   Procedure: Excisional Debridement of Wound Rationale: Removal of non-viable soft tissue from the wound to promote healing.  Anesthesia:  none Post-Debridement Wound Measurements: 0.3 x 0.3 x 0.2 cm.  Not able to measure the wound prior to debridement as it was completely covered with callus. Type of Debridement: Sharp Excisional Tissue Removed: Non-viable soft tissue Depth of Debridement: subcutaneous tissue. Technique: Sharp excisional debridement to bleeding, viable wound base. -#502 with scalpel Dressing: Dry, sterile, compression dressing. Disposition: Patient tolerated procedure well.   Return in about 2 weeks (around 06/29/2022).     Donna Hopkins DPM

## 2022-06-20 NOTE — Addendum Note (Signed)
Addended by: Ovid Curd R on: 06/20/2022 11:10 AM   Modules accepted: Orders

## 2022-07-10 ENCOUNTER — Ambulatory Visit (INDEPENDENT_AMBULATORY_CARE_PROVIDER_SITE_OTHER): Payer: 59 | Admitting: Podiatry

## 2022-07-10 ENCOUNTER — Ambulatory Visit: Payer: 59

## 2022-07-10 DIAGNOSIS — E1149 Type 2 diabetes mellitus with other diabetic neurological complication: Secondary | ICD-10-CM

## 2022-07-10 DIAGNOSIS — M2041 Other hammer toe(s) (acquired), right foot: Secondary | ICD-10-CM

## 2022-07-10 DIAGNOSIS — M2042 Other hammer toe(s) (acquired), left foot: Secondary | ICD-10-CM

## 2022-07-10 DIAGNOSIS — L97522 Non-pressure chronic ulcer of other part of left foot with fat layer exposed: Secondary | ICD-10-CM | POA: Diagnosis not present

## 2022-07-10 DIAGNOSIS — I739 Peripheral vascular disease, unspecified: Secondary | ICD-10-CM | POA: Diagnosis not present

## 2022-07-10 MED ORDER — MUPIROCIN 2 % EX OINT: 1.0000 | TOPICAL_OINTMENT | Freq: Two times a day (BID) | CUTANEOUS | 2 refills | Status: DC

## 2022-07-10 NOTE — Progress Notes (Incomplete)
Patient presents to the office today for diabetic shoe and insole measuring.  Patient was measured with brannock device to determine size and width for 1 pair of extra depth shoes and foam casted for 3 pair of insoles.   ABN signed.   Documentation of medical necessity will be sent to patient's treating diabetic doctor to verify and sign.   Patient's diabetic provider: ***  Shoes and insoles will be ordered at that time and patient will be notified for an appointment for fitting when they arrive.   Brannock measurement: ***  Patient shoe selection-   1st   Shoe choice:   ***  2nd  Shoe choice:   ***  Shoe size ordered: ***  

## 2022-07-10 NOTE — Progress Notes (Signed)
  Subjective:  Patient ID: Donna Hopkins, female    DOB: [birth year],  MRN: [id number]  Chief Complaint  Patient presents with   Nail Problem    Thick painful toenails, 3 month follow up    67 y.o. female presents with the above complaint. History confirmed with patient.  Since my last visit with her she developed an ulceration submetatarsal 1 on the left foot, Dr. Loreta Ave saw her for this and placed her on cephalexin and has prescribed mupirocin ointment both of which she has completed using.  Objective:  Physical Exam: warm, good capillary refill, no trophic changes or ulcerative lesions, weak DP and PT pulses, and abnormal sensory exam with loss of protective sensation at the toes, she has a small full is ulceration under a large amount of hyperkeratosis with petechial hemorrhage noted in the deeper tissue submetatarsal 1 left foot, no signs of infection or active drainage.  Postdebridement measurements are 2.0 x 2.0 x 1.5    Assessment:   1. Ulcer of left foot with fat layer exposed (HCC)   2. PAD (peripheral artery disease) (HCC)      Plan:  Patient was evaluated and treated and all questions answered.  Wound is debrided in excisional manner sharply of all devitalized hyperkeratosis and tissue.  I did recommend she continue utilizing mupirocin ointment and this was refilled.  I also agree with arterial testing and this has been ordered as well.  I will see her back in 2 to 3 weeks for follow-up.  She was casted for diabetic shoes today to offload the ulceration long-term to prevent recurrence.   Return in about 3 weeks (around 07/31/2022) for wound care.

## 2022-07-16 ENCOUNTER — Ambulatory Visit: Payer: 59 | Attending: Cardiology

## 2022-07-16 DIAGNOSIS — I5022 Chronic systolic (congestive) heart failure: Secondary | ICD-10-CM

## 2022-07-16 DIAGNOSIS — Z95810 Presence of automatic (implantable) cardiac defibrillator: Secondary | ICD-10-CM | POA: Diagnosis not present

## 2022-07-20 NOTE — Progress Notes (Signed)
EPIC Encounter for ICM Monitoring  Patient Name: Donna Hopkins is a 67 y.o. female Date: 07/20/2022 Primary Care Physican: Dennard Schaumann, MD Primary Cardiologist: Hanley Hays Electrophysiologist: Townsend Roger Pacing: >99%         03/19/2022 Office Weight: 225 lbs   AT/AF Burden:  <1% (takes Warfarin)                                                                  Transmission reviewed.             Diet:  Does not follow low salt diet.  Eats a lot of ice and drinks > 64 oz daily   Corvue Thoracic impedance suggesting normal fluid levels since 4/1.    Prescribed: Furosemide 40 mg Take 1 tablet (40 mg total) by mouth daily.  She reports 02/12/22, she is taking Furosemide 40 mg twice as ordered by PCP.   Potassium 20 mEq take 1 tablet daily.   Labs: 06/10/2022 Creatinine 0.76, BUN 13, Potassium 3.5, Sodium 133, GFR >60 A complete set of results can be found in Results Review.   Recommendations:   No changes   Follow-up plan: ICM clinic phone appointment on 07/16/2022.   91 day device clinic remote transmission 07/23/2022     EP/Cardiology Office Visits:  Recall 09/15/2022 with Dr Lalla Brothers.     Copy of ICM check sent to Dr. Lalla Brothers.      3 month ICM trend: 07/16/2022.    12-14 Month ICM trend:     Karie Soda, RN 07/20/2022 3:53 PM

## 2022-07-23 ENCOUNTER — Ambulatory Visit (INDEPENDENT_AMBULATORY_CARE_PROVIDER_SITE_OTHER): Payer: 59

## 2022-07-23 DIAGNOSIS — I428 Other cardiomyopathies: Secondary | ICD-10-CM | POA: Diagnosis not present

## 2022-07-24 LAB — CUP PACEART REMOTE DEVICE CHECK
Battery Remaining Longevity: 28 mo
Battery Remaining Percentage: 38 %
Battery Voltage: 2.92 V
Brady Statistic AP VP Percent: 1 %
Brady Statistic AP VS Percent: 1 %
Brady Statistic AS VP Percent: 99 %
Brady Statistic AS VS Percent: 1 %
Brady Statistic RA Percent Paced: 1 %
Date Time Interrogation Session: 20240513020018
HighPow Impedance: 62 Ohm
HighPow Impedance: 62 Ohm
Implantable Lead Connection Status: 753985
Implantable Lead Connection Status: 753985
Implantable Lead Connection Status: 753985
Implantable Lead Implant Date: 20140429
Implantable Lead Implant Date: 20140617
Implantable Lead Implant Date: 20200205
Implantable Lead Location: 753858
Implantable Lead Location: 753859
Implantable Lead Location: 753860
Implantable Pulse Generator Implant Date: 20200205
Lead Channel Impedance Value: 440 Ohm
Lead Channel Impedance Value: 480 Ohm
Lead Channel Impedance Value: 700 Ohm
Lead Channel Pacing Threshold Amplitude: 0.75 V
Lead Channel Pacing Threshold Amplitude: 0.75 V
Lead Channel Pacing Threshold Amplitude: 2.375 V
Lead Channel Pacing Threshold Pulse Width: 0.5 ms
Lead Channel Pacing Threshold Pulse Width: 0.5 ms
Lead Channel Pacing Threshold Pulse Width: 0.5 ms
Lead Channel Sensing Intrinsic Amplitude: 12 mV
Lead Channel Sensing Intrinsic Amplitude: 3.2 mV
Lead Channel Setting Pacing Amplitude: 2 V
Lead Channel Setting Pacing Amplitude: 2.5 V
Lead Channel Setting Pacing Amplitude: 3.375
Lead Channel Setting Pacing Pulse Width: 0.5 ms
Lead Channel Setting Pacing Pulse Width: 0.5 ms
Lead Channel Setting Sensing Sensitivity: 0.5 mV
Pulse Gen Serial Number: 9879509

## 2022-07-26 ENCOUNTER — Other Ambulatory Visit: Payer: Self-pay | Admitting: Podiatry

## 2022-07-26 NOTE — Telephone Encounter (Signed)
Attempted to call patient to see if she needs this refill and to see if she had any signs of infection. Not able to reach her and voicemail is full.

## 2022-07-30 ENCOUNTER — Telehealth: Payer: Self-pay | Admitting: Podiatry

## 2022-07-30 NOTE — Telephone Encounter (Signed)
Pt called and did not get the antibiotics until a couple of days ago so has not completed them. She is wanting to know if you wanted her to keep her appt for tomorrow since she has not completed her antibiotics or should she reschedule for later once she has finished the antibiotics?

## 2022-07-30 NOTE — Telephone Encounter (Signed)
Called mobile phone and voicemail is full. Left message on home number to keep appt for tomorrow at 345pm per Dr Lilian Kapur.

## 2022-07-31 ENCOUNTER — Ambulatory Visit: Payer: 59 | Admitting: Podiatry

## 2022-07-31 ENCOUNTER — Telehealth: Payer: Self-pay | Admitting: Podiatry

## 2022-07-31 NOTE — Telephone Encounter (Signed)
Pt called in stating that she hasnt taken her Rx yet and wants to know if she needs to still come in today or wait till she starts her antibiotics?  Please advise

## 2022-07-31 NOTE — Telephone Encounter (Signed)
Confirmed with pt that she still needs to come in today

## 2022-08-01 ENCOUNTER — Other Ambulatory Visit: Payer: Self-pay | Admitting: Podiatry

## 2022-08-01 NOTE — Progress Notes (Signed)
error 

## 2022-08-02 ENCOUNTER — Other Ambulatory Visit: Payer: Self-pay | Admitting: Podiatry

## 2022-08-02 MED ORDER — FLUCONAZOLE 150 MG PO TABS: 150.0000 mg | ORAL_TABLET | Freq: Once | ORAL | 0 refills | Status: AC

## 2022-08-16 ENCOUNTER — Ambulatory Visit (INDEPENDENT_AMBULATORY_CARE_PROVIDER_SITE_OTHER): Payer: 59 | Admitting: Podiatry

## 2022-08-16 ENCOUNTER — Ambulatory Visit (INDEPENDENT_AMBULATORY_CARE_PROVIDER_SITE_OTHER): Payer: 59

## 2022-08-16 DIAGNOSIS — L97519 Non-pressure chronic ulcer of other part of right foot with unspecified severity: Secondary | ICD-10-CM | POA: Diagnosis not present

## 2022-08-16 DIAGNOSIS — L97522 Non-pressure chronic ulcer of other part of left foot with fat layer exposed: Secondary | ICD-10-CM

## 2022-08-16 NOTE — Patient Instructions (Signed)
Call [phone] to schedule your vascular testing:  [facility] [address], [place], [place] [address]  

## 2022-08-17 NOTE — Progress Notes (Signed)
Remote ICD transmission.   

## 2022-08-20 ENCOUNTER — Ambulatory Visit: Payer: 59 | Attending: Cardiology

## 2022-08-20 DIAGNOSIS — Z95810 Presence of automatic (implantable) cardiac defibrillator: Secondary | ICD-10-CM

## 2022-08-20 DIAGNOSIS — I5022 Chronic systolic (congestive) heart failure: Secondary | ICD-10-CM

## 2022-08-20 NOTE — Progress Notes (Signed)
  Subjective:  Patient ID: Donna Hopkins, female    DOB: Mar 26, 1955,  MRN: [id number]  Chief Complaint  Patient presents with   Foot Ulcer    Left foot ulcer, 3 week follow up    67 y.o. female presents with the above complaint. History confirmed with patient.   She returns for follow-up she says she has been changing the dressing she did complete the antibiotics after missing a rescheduled appointment  Objective:  Physical Exam: warm, good capillary refill, no trophic changes or ulcerative lesions, weak DP and PT pulses, and abnormal sensory exam with loss of protective sensation at the toes, she has a small full is ulceration under a large amount of hyperkeratosis with petechial hemorrhage noted in the deeper tissue submetatarsal 1 left foot, no signs of infection or active drainage.  Postdebridement measurements are 1.0 x 1.5 x 0.5 cm.  New ulceration right hallux 0.5 x 0.8 x 0.3 cm      Today radiographs taken today show no osteolysis or evidence of osteomyelitis  Assessment:   1. Ulcer of right foot, unspecified ulcer stage (HCC)   2. Ulcer of left foot with fat layer exposed (HCC)      Plan:  Patient was evaluated and treated and all questions answered.  Ulcer bilateral foot -We discussed the etiology and factors that are a part of the wound healing process.  We also discussed the risk of infection both soft tissue and osteomyelitis from open ulceration.  Discussed the risk of limb loss if this happens or worsens. -Debridement as below. -Dressed with Iodosorb, DSD. -Continue home dressing changes daily with 4 x 4 gauze and mupirocin ointment -Continue off-loading with surgical shoe. -Vascular testing pulses are weakly palpable and wound bed shows local good perfusion on debridement -HgbA1c: Unknown but elevated she is due for a new check soon with her PCP -Last antibiotics: Completed antibiotics -Imaging: x-ray reviewed, shows no signs of erosions, osteolysis,  osteomyelitis or emphysema.  Procedure: Excisional Debridement of Wound Rationale: Removal of non-viable soft tissue from the wound to promote healing.  Anesthesia: none Post-Debridement Wound Measurements: They are noted above Type of Debridement: Sharp Excisional Tissue Removed: Non-viable soft tissue Depth of Debridement: subcutaneous tissue. Technique: Sharp excisional debridement to bleeding, viable wound base.  Dressing: Dry, sterile, compression dressing. Disposition: Patient tolerated procedure well.        Return in about 3 weeks (around 09/06/2022) for wound care.

## 2022-08-22 ENCOUNTER — Telehealth: Payer: Self-pay

## 2022-08-22 NOTE — Telephone Encounter (Signed)
Remote ICM transmission received.  Attempted call to patient regarding ICM remote transmission and no answer, mail box full.  

## 2022-08-22 NOTE — Progress Notes (Signed)
EPIC Encounter for ICM Monitoring  Patient Name: Donna Hopkins is a 67 y.o. female Date: 08/22/2022 Primary Care Physican: Dennard Schaumann, MD Primary Cardiologist: Hanley Hays Electrophysiologist: Townsend Roger Pacing: >99%         03/19/2022 Office Weight: 225 lbs   AT/AF Burden:  <1% (takes Warfarin)                                                                  Attempted call to patient and unable to reach.  Transmission reviewed.            Diet:  Does not follow low salt diet.  Eats a lot of ice and drinks > 64 oz daily   Corvue Thoracic impedance suggesting normal fluid levels with the exception of possible fluid accumulation from 5/8-5/14 and 5/28-5/31.   Prescribed: Furosemide 40 mg Take 1 tablet (40 mg total) by mouth daily.  She reports 02/12/22, she is taking Furosemide 40 mg twice as ordered by PCP.   Potassium 20 mEq take 1 tablet daily.   Labs: 06/10/2022 Creatinine 0.76, BUN 13, Potassium 3.5, Sodium 133, GFR >60 A complete set of results can be found in Results Review.   Recommendations:   Unable to reach.     Follow-up plan: ICM clinic phone appointment on 09/24/2022.   91 day device clinic remote transmission 10/22/2022     EP/Cardiology Office Visits:  Recall 09/15/2022 with Dr Lalla Brothers.     Copy of ICM check sent to Dr. Lalla Brothers.      3 month ICM trend: 08/20/2022.    12-14 Month ICM trend:     Karie Soda, RN 08/22/2022 8:54 AM

## 2022-08-28 ENCOUNTER — Telehealth: Payer: Self-pay | Admitting: Cardiology

## 2022-08-28 NOTE — Telephone Encounter (Signed)
Spoke to the patient, she reported having mild shortness of breath, dizziness, and lightheaded  for the past 2 weeks. Patient stated on 6/6 she fell , had appointment with [facility] pt stated she mentioned to one of the nurses. Pt did not contact PCP.   Advised the patient the office made several attempts to contact her to send remote transmission. Pt attempt to send remote transmission, however, she is having difficulty. Will forward to device clinic for assistant, MD and nurse for advise.

## 2022-08-28 NOTE — Telephone Encounter (Signed)
Pt c/o Shortness Of Breath: STAT if SOB developed within the last 24 hours or pt is noticeably SOB on the phone  1. Are you currently SOB (can you hear that pt is SOB on the phone)? No  2. How long have you been experiencing SOB? About a couple of weeks   3. Are you SOB when sitting or when up moving around? When moving around   4. Are you currently experiencing any other symptoms? Headaches

## 2022-08-30 NOTE — Telephone Encounter (Signed)
Attempted to reach patient at both home and mobile numbers.  LM on Home, VM is full on mobile Patient to return call.

## 2022-08-31 NOTE — Telephone Encounter (Signed)
Patient was returning call, and states she still feels the same. Schedule patient for Monday at 9:40. Please advise

## 2022-08-31 NOTE — Telephone Encounter (Signed)
Patient had remote transmission on 08/27/22 ( original phone call date 08/28/22) patient is followed by ICM Heart Failure clinic with Randon Goldsmith, Remote Transmission on 08/27/22 normal device function no significant episodes. Daily impedance on CorVue trending down but does not indicate overload as of 6/17 transmission  Will give patient instructions on how to send remote transmission at appointment on 09/03/22

## 2022-09-01 NOTE — Progress Notes (Deleted)
  Electrophysiology Office Note:   ID:  Donna Hopkins, DOB Sep 22, 1955, MRN [id number]  Primary Cardiologist: None Electrophysiologist: Lanier Prude, MD  {Click to update primary MD,subspecialty MD or APP then REFRESH:1}    History of Present Illness:   Donna Hopkins is a 67 y.o. female with h/o chronic systolic CHF, s/p CRT, s/p AVR, CKD III, DM2, HTN, GERD, and obesity seen today for routine electrophysiology followup.   She states for the past several weeks she has been having SOB, dizziness, and lightheadedness. *** Since last being seen in our clinic the patient reports doing ***.  she denies chest pain, palpitations, dyspnea, PND, orthopnea, nausea, vomiting, dizziness, syncope, edema, weight gain, or early satiety.   Review of systems complete and found to be negative unless listed in HPI.   Device History: [employer] Jude BiV ICD implanted 2018, lead revision (abandoned lead) 04/2018 for CHF   Studies Reviewed:    ICD Interrogation-  reviewed in detail today,  See PACEART report.  EKG is not ordered today. EKG from 06/10/2022 reviewed which showed NSR at 82 with bifascicular block       Risk Assessment/Calculations:   {Does this patient have ATRIAL FIBRILLATION?[phone]} No BP recorded.  {Refresh Note OR Click here to enter BP  :1}***        Physical Exam:   VS:  There were no vitals taken for this visit.   Wt Readings from Last 3 Encounters:  03/19/22 225 lb 3.2 oz (102.2 kg)  05/22/21 237 lb (107.5 kg)  11/12/20 231 lb (104.8 kg)     GEN: Well nourished, well developed in no acute distress NECK: No JVD; No carotid bruits CARDIAC: {EPRHYTHM:28826}, no murmurs, rubs, gallops RESPIRATORY:  Clear to auscultation without rales, wheezing or rhonchi  ABDOMEN: Soft, non-tender, non-distended EXTREMITIES:  No edema; No deformity   ASSESSMENT AND PLAN:    HFrecEF s/p Abbott CRT-D  euvolemic today Stable on an appropriate medical regimen Normal ICD function See Pace  Art report No changes today  H/o AVR Stable by Echo 05/2021  Disposition:   Follow up with {EPPROVIDERS:28135} {EPFOLLOW UP:28173}   Signed, Graciella Freer, PA-C

## 2022-09-03 ENCOUNTER — Ambulatory Visit: Payer: 59 | Attending: Student | Admitting: Student

## 2022-09-03 ENCOUNTER — Ambulatory Visit: Payer: 59 | Admitting: Student

## 2022-09-03 ENCOUNTER — Encounter: Payer: Self-pay | Admitting: Student

## 2022-09-03 VITALS — BP 130/50 | HR 94 | Ht 69.0 in | Wt 222.8 lb

## 2022-09-03 DIAGNOSIS — Z95810 Presence of automatic (implantable) cardiac defibrillator: Secondary | ICD-10-CM | POA: Diagnosis not present

## 2022-09-03 DIAGNOSIS — I5022 Chronic systolic (congestive) heart failure: Secondary | ICD-10-CM | POA: Diagnosis not present

## 2022-09-03 DIAGNOSIS — I428 Other cardiomyopathies: Secondary | ICD-10-CM

## 2022-09-03 LAB — CUP PACEART INCLINIC DEVICE CHECK
Battery Remaining Longevity: 25 mo
Brady Statistic RA Percent Paced: 0.01 %
Brady Statistic RV Percent Paced: 99.29 %
Date Time Interrogation Session: 20240624133522
HighPow Impedance: 63 Ohm
Implantable Lead Connection Status: 753985
Implantable Lead Connection Status: 753985
Implantable Lead Connection Status: 753985
Implantable Lead Implant Date: 20140429
Implantable Lead Implant Date: 20140617
Implantable Lead Implant Date: 20200205
Implantable Lead Location: 753858
Implantable Lead Location: 753859
Implantable Lead Location: 753860
Implantable Pulse Generator Implant Date: 20200205
Lead Channel Impedance Value: 387.5 Ohm
Lead Channel Impedance Value: 462.5 Ohm
Lead Channel Impedance Value: 712.5 Ohm
Lead Channel Pacing Threshold Amplitude: 0.75 V
Lead Channel Pacing Threshold Amplitude: 0.75 V
Lead Channel Pacing Threshold Amplitude: 0.75 V
Lead Channel Pacing Threshold Amplitude: 2.125 V
Lead Channel Pacing Threshold Pulse Width: 0.5 ms
Lead Channel Pacing Threshold Pulse Width: 0.5 ms
Lead Channel Pacing Threshold Pulse Width: 0.5 ms
Lead Channel Pacing Threshold Pulse Width: 0.5 ms
Lead Channel Sensing Intrinsic Amplitude: 12 mV
Lead Channel Sensing Intrinsic Amplitude: 3.6 mV
Lead Channel Setting Pacing Amplitude: 2 V
Lead Channel Setting Pacing Amplitude: 2.5 V
Lead Channel Setting Pacing Amplitude: 2.75 V
Lead Channel Setting Pacing Pulse Width: 0.5 ms
Lead Channel Setting Pacing Pulse Width: 0.7 ms
Lead Channel Setting Sensing Sensitivity: 0.5 mV
Pulse Gen Serial Number: 9879509

## 2022-09-03 LAB — CBC
Hematocrit: 26 % — ABNORMAL LOW (ref 34.0–46.6)
Hemoglobin: 8.3 g/dL — ABNORMAL LOW (ref 11.1–15.9)
MCH: 30.3 pg (ref 26.6–33.0)
MCHC: 31.9 g/dL (ref 31.5–35.7)
MCV: 95 fL (ref 79–97)
Platelets: 373 10*3/uL (ref 150–450)
RBC: 2.74 x10E6/uL — CL (ref 3.77–5.28)
RDW: 14.9 % (ref 11.7–15.4)
WBC: 7.7 10*3/uL (ref 3.4–10.8)

## 2022-09-03 LAB — BASIC METABOLIC PANEL
BUN/Creatinine Ratio: 17 (ref 12–28)
BUN: 13 mg/dL (ref 8–27)
CO2: 25 mmol/L (ref 20–29)
Calcium: 9.4 mg/dL (ref 8.7–10.3)
Chloride: 101 mmol/L (ref 96–106)
Creatinine, Ser: 0.77 mg/dL (ref 0.57–1.00)
Glucose: 159 mg/dL — ABNORMAL HIGH (ref 70–99)
Potassium: 4.4 mmol/L (ref 3.5–5.2)
Sodium: 134 mmol/L (ref 134–144)
eGFR: 83 mL/min/{1.73_m2} (ref >59)

## 2022-09-03 NOTE — Progress Notes (Signed)
  Electrophysiology Office Note:   ID:  Donna Hopkins, DOB [birth year], MRN [id number]  Primary Cardiologist: None Electrophysiologist: Lanier Prude, MD      History of Present Illness:   Donna Hopkins is a 67 y.o. female with h/o chronic systolic CHF, s/p CRT, s/p AVR, CKD III, DM2, HTN, GERD, and obesity seen today for routine electrophysiology followup.   She states for the past several weeks she has been having SOB, dizziness, and lightheadedness. It has gotten somewhat better over the weekend.   Friday she noted black stools x 2, has not had BM since. Has h/o anemia and has follow up with GI tomorrow. Has intermittent peripheral edema that is OK today.   Review of systems complete and found to be negative unless listed in HPI.   Device History: [employer] BiV ICD implanted 2018, lead revision (abandoned lead) 04/2018 for CHF   Studies Reviewed:    ICD Interrogation-  reviewed in detail today,  See PACEART report.  EKG is not ordered today. EKG from 06/10/2022 reviewed which showed NSR at 82 with bifascicular block          Physical Exam:   VS:  BP (!) 130/50   Pulse 94   Ht 5\' 9"  (1.753 m)   Wt 222 lb 12.8 oz (101.1 kg)   SpO2 98%   BMI 32.90 kg/m    Wt Readings from Last 3 Encounters:  09/03/22 222 lb 12.8 oz (101.1 kg)  03/19/22 225 lb 3.2 oz (102.2 kg)  05/22/21 237 lb (107.5 kg)     GEN: Well nourished, well developed in no acute distress NECK: No JVD; No carotid bruits CARDIAC: Regular rate and rhythm, no murmurs, rubs, gallops RESPIRATORY:  Clear to auscultation without rales, wheezing or rhonchi  ABDOMEN: Soft, non-tender, non-distended EXTREMITIES:  No edema; No deformity   ASSESSMENT AND PLAN:    HFrecEF s/p Abbott CRT-D  euvolemic today Stable on an appropriate medical regimen Normal ICD function See Pace Art report No changes today  H/o AVR Stable by Echo 05/2021  Fatigue SOB Suspect multifactorial. Volume status ok by exam and corvue, Recent  overload has resolved. Concerning for anemia given black stools last week. Labs today, has GI f/u this week Encouraged fluid and sodium monitoring.   Disposition:   Follow up with Dr. Lalla Brothers in 6 months. Sooner with worsening symptoms.    Signed, Graciella Freer, PA-C

## 2022-09-03 NOTE — Patient Instructions (Signed)
Medication Instructions:  Your physician recommends that you continue on your current medications as directed. Please refer to the Current Medication list given to you today.  *If you need a refill on your cardiac medications before your next appointment, please call your pharmacy*  Lab Work: STAT BMET, CBC-TODAY If you have labs (blood work) drawn today and your tests are completely normal, you will receive your results only by: MyChart Message (if you have MyChart) OR A paper copy in the mail If you have any lab test that is abnormal or we need to change your treatment, we will call you to review the results.  Follow-Up: At [facility], you and your health needs are our priority.  As part of our continuing mission to provide you with exceptional heart care, we have created designated Provider Care Teams.  These Care Teams include your primary Cardiologist (physician) and Advanced Practice Providers (APPs -  Physician Assistants and Nurse Practitioners) who all work together to provide you with the care you need, when you need it.  Your next appointment:   6 month(s)  Provider:   Steffanie Dunn, MD

## 2022-09-12 ENCOUNTER — Ambulatory Visit (INDEPENDENT_AMBULATORY_CARE_PROVIDER_SITE_OTHER): Payer: 59 | Admitting: Podiatry

## 2022-09-12 DIAGNOSIS — L97512 Non-pressure chronic ulcer of other part of right foot with fat layer exposed: Secondary | ICD-10-CM | POA: Diagnosis not present

## 2022-09-12 DIAGNOSIS — E0843 Diabetes mellitus due to underlying condition with diabetic autonomic (poly)neuropathy: Secondary | ICD-10-CM | POA: Diagnosis not present

## 2022-09-12 MED ORDER — MUPIROCIN 2 % EX OINT: 1.0000 | TOPICAL_OINTMENT | Freq: Two times a day (BID) | CUTANEOUS | 2 refills | Status: DC

## 2022-09-12 NOTE — Progress Notes (Signed)
Chief Complaint  Patient presents with   Foot Ulcer    B/L toe preulcerous callouses   Callouses    Subjective:  67 y.o. female with PMHx of diabetes mellitus presenting today for follow-up evaluation of an ulcer to the plantar aspect of the first MTP of the right foot.  Patient admits to walking barefoot excessively.  She develops ulcers and callus lesions to her feet bilaterally.  Currently being treated for an ulcer to the plantar aspect of the first MTP.  Seen routinely by [name]  Past Medical History:  Diagnosis Date   Carotid stenosis, asymptomatic, bilateral    Chronic kidney disease, stage III (moderate) (HCC)    Chronic low back pain    Clinical systolic heart failure, chronic (HCC)    Depression    Diabetes mellitus    Eosinophilic gastritis    GERD (gastroesophageal reflux disease)    Gout, renal disease    H/O mechanical aortic valve replacement    on coumadin   Hx of aortic valve replacement    Hypertension    Hypocalcemia    Hyponatremia    ICD (implantable cardioverter-defibrillator) in place    Medication refill    Mixed hyperlipidemia    Neuropathy    Obesity    OP (osteoporosis)    Peripheral vascular disease, unspecified (HCC)    Persistent insomnia    Personal history of colonic polyps    Pulmonary hypertension (HCC)    Snoring    declines sleep study   Tricuspid regurgitation    Uncontrolled type 2 diabetes mellitus with complication    Valgus foot    Vertigo, benign positional    Vitamin D deficiency     Past Surgical History:  Procedure Laterality Date   ABDOMINAL HYSTERECTOMY     BACK SURGERY     BI-VENTRICULAR IMPLANTABLE CARDIOVERTER DEFIBRILLATOR  (CRT-D)  07/08/2012   SJM Quadra Assura BiV ICD implanted by Dr Reed Pandy at [facility]   CARDIAC VALVE REPLACEMENT     LEAD INSERTION N/A 04/16/2018   Successful BiV ICD system revision with a new RV lead and pulse generator replacement with a [employer] Assura MP model  [phone] ICD    Allergies  Allergen Reactions   Canagliflozin Shortness Of Breath   Metformin Diarrhea    Other Reaction(s): Diarrhea   Tetanus Toxoids     Infection at injection site     Objective/Physical Exam General: The patient is alert and oriented x3 in no acute distress.  Dermatology:  Wound #1 noted to the plantar aspect of the first MTP measuring approximately 1.0 x 0.2 x 0.1 cm (LxWxD).   To the noted ulceration(s), there is no eschar. There is a moderate amount of slough, fibrin, and necrotic tissue noted. Granulation tissue and wound base is red. There is a minimal amount of serosanguineous drainage noted. There is no exposed bone muscle-tendon ligament or joint. There is no malodor. Periwound integrity is intact. Skin is warm, dry and supple bilateral lower extremities.  Vascular: Skin is warm to touch.  Capillary refill WNL.  There has been two orders placed for ABIs however the patient has not had them completed.  Neurological: Light touch and protective threshold diminished bilaterally.   Musculoskeletal Exam: Range of motion within normal limits to all pedal and ankle joints bilateral. Muscle strength 5/5 in all groups bilateral.  No prior amputations  Assessment: 1.  Ulcer plantar aspect of the first MTP right secondary to diabetes mellitus 2.  diabetes mellitus w/ peripheral neuropathy 3.  Multiple preulcerative callus lesions bilateral   Plan of Care:  -Patient was evaluated. -Medically necessary excisional debridement including subcutaneous tissue was performed using a tissue nipper and a chisel blade. Excisional debridement of all the necrotic nonviable tissue down to healthy bleeding viable tissue was performed with post-debridement measurements same as pre-. -Continue antibiotic ointment and a Band-Aid to the first MTP of the right foot -Stressed the importance of not going barefoot.  Recommend good supportive shoes even around the house -Return to  clinic 4 weeks with Dr. Perrin Smack, DPM [facility]  Dr. Felecia Shelling, DPM    [address] [place], [place] [address]                Office [phone]  Fax [phone]

## 2022-09-24 ENCOUNTER — Ambulatory Visit: Payer: 59 | Attending: Cardiology

## 2022-09-24 DIAGNOSIS — Z95810 Presence of automatic (implantable) cardiac defibrillator: Secondary | ICD-10-CM | POA: Diagnosis not present

## 2022-09-24 DIAGNOSIS — I5022 Chronic systolic (congestive) heart failure: Secondary | ICD-10-CM

## 2022-09-30 NOTE — Progress Notes (Signed)
EPIC Encounter for ICM Monitoring  Patient Name: Rosita Guzzetta is a 67 y.o. female Date: 09/30/2022 Primary Care Physican: Dennard Schaumann, MD Primary Cardiologist: Hanley Hays Electrophysiologist: Townsend Roger Pacing: 98%         03/19/2022 Office Weight: 225 lbs   AT/AF Burden:  0% (takes Warfarin)                                                                  Transmission reviewed.            Diet:  Does not follow low salt diet.  Eats a lot of ice and drinks > 64 oz daily   Corvue Thoracic impedance suggesting intermittent days with possible fluid accumulation within the last month.   Prescribed: Furosemide 40 mg Take 1 tablet (40 mg total) by mouth daily.  She reports 02/12/22, she is taking Furosemide 40 mg twice as ordered by PCP.   Potassium 20 mEq take 1 tablet daily.   Labs: 06/10/2022 Creatinine 0.76, BUN 13, Potassium 3.5, Sodium 133, GFR >60 A complete set of results can be found in Results Review.   Recommendations:   No changes   Follow-up plan: ICM clinic phone appointment on 10/29/2022.   91 day device clinic remote transmission 10/22/2022     EP/Cardiology Office Visits:  Recall 09/15/2022 with Dr Lalla Brothers.     Copy of ICM check sent to Dr. Lalla Brothers.      3 month ICM trend: 09/24/2022.    12-14 Month ICM trend:     Karie Soda, RN 09/30/2022 9:25 AM

## 2022-10-02 ENCOUNTER — Telehealth: Payer: Self-pay | Admitting: Podiatry

## 2022-10-02 NOTE — Telephone Encounter (Signed)
No answer vm is full tried to reach pt for diabetic shoes  .

## 2022-10-11 ENCOUNTER — Ambulatory Visit: Payer: 59 | Admitting: Podiatry

## 2022-10-22 ENCOUNTER — Ambulatory Visit: Payer: 59 | Admitting: Podiatry

## 2022-10-22 ENCOUNTER — Ambulatory Visit (INDEPENDENT_AMBULATORY_CARE_PROVIDER_SITE_OTHER): Payer: 59

## 2022-10-22 DIAGNOSIS — I5022 Chronic systolic (congestive) heart failure: Secondary | ICD-10-CM | POA: Diagnosis not present

## 2022-10-23 NOTE — Telephone Encounter (Signed)
Tried to reach patient to schedule picking up her diabetic shoes  2nd attempt

## 2022-10-29 ENCOUNTER — Ambulatory Visit: Payer: 59 | Attending: Cardiovascular Disease

## 2022-10-29 DIAGNOSIS — Z95810 Presence of automatic (implantable) cardiac defibrillator: Secondary | ICD-10-CM

## 2022-10-29 DIAGNOSIS — I5022 Chronic systolic (congestive) heart failure: Secondary | ICD-10-CM

## 2022-10-30 ENCOUNTER — Ambulatory Visit: Payer: 59 | Admitting: Podiatry

## 2022-10-30 ENCOUNTER — Encounter: Payer: Self-pay | Admitting: Podiatry

## 2022-10-30 ENCOUNTER — Ambulatory Visit (INDEPENDENT_AMBULATORY_CARE_PROVIDER_SITE_OTHER): Payer: 59

## 2022-10-30 DIAGNOSIS — L97522 Non-pressure chronic ulcer of other part of left foot with fat layer exposed: Secondary | ICD-10-CM

## 2022-10-30 DIAGNOSIS — L97512 Non-pressure chronic ulcer of other part of right foot with fat layer exposed: Secondary | ICD-10-CM | POA: Diagnosis not present

## 2022-10-30 DIAGNOSIS — L03116 Cellulitis of left lower limb: Secondary | ICD-10-CM | POA: Diagnosis not present

## 2022-10-30 DIAGNOSIS — L089 Local infection of the skin and subcutaneous tissue, unspecified: Secondary | ICD-10-CM

## 2022-10-30 DIAGNOSIS — E11628 Type 2 diabetes mellitus with other skin complications: Secondary | ICD-10-CM

## 2022-10-30 MED ORDER — AMOXICILLIN-POT CLAVULANATE 875-125 MG PO TABS: 1.0000 | ORAL_TABLET | Freq: Two times a day (BID) | ORAL | 0 refills | Status: DC

## 2022-10-30 MED ORDER — FLUCONAZOLE 150 MG PO TABS: 150.0000 mg | ORAL_TABLET | Freq: Once | ORAL | 0 refills | Status: AC

## 2022-10-30 NOTE — Progress Notes (Signed)
  Subjective:  Patient ID: Donna Hopkins, female    DOB: Dec 03, 1955,  MRN: [id number]  Chief Complaint  Patient presents with   Wound Check    Wound check to left foot.     67 y.o. female presents with the above complaint. History confirmed with patient.   She notes that she has been smelling an odor coming from the foot that got wet during the severe rain we have been having and has not been able to make it in for her appointments.   Objective:  Physical Exam: warm, good capillary refill, no trophic changes or ulcerative lesions, weak DP and PT pulses, and abnormal sensory exam with loss of protective sensation at the toes, her left foot has large hyperkeratosis surrounding with severe malodor, there is edema and cellulitis to the mid ankle, no purulent drainage, debridement of the nonviable tissue reveals a 50 mm x 50 mm ulceration with exposed subcutaneous tissue, there is no exposed bone tendon or joint or deep sinus tract.     New left foot radiographs taken today show no significant bony abnormalities soft tissue emphysema or erosions in the metatarsal or sesamoids  Assessment:   1. Ulcer of left foot with fat layer exposed (HCC)   2. Cellulitis of left foot      Plan:  Patient was evaluated and treated and all questions answered.  Ulcer bilateral foot -Today she has acute worsening of her left foot wound with cellulitis and a diabetic foot infection developing.  We discussed possibility of worsening infection.  Fortunately no deep sinus tract or emergent needs, she is afebrile and has not had any systemic signs of infection at home.  We discussed possibility of developing these and if these develop she will proceed directly to the emergency room. -Start antibiotics, and Augmentin sent to pharmacy.  She often gets fluconazole for yeast infections when she has antibiotics and this was sent as well.  We discussed monitoring her INR closely with a single dose of  fluconazole. -Debridement as below. -Dressed with Iodosorb, DSD. -Continue home dressing changes daily with 4 x 4 gauze and mupirocin ointment -Continue off-loading with surgical shoe. -Vascular testing pulses are weakly palpable and wound bed shows local good perfusion on debridement -HgbA1c: She still has not had her A1c rechecked -Last antibiotics: Rx for Augmentin sent to pharmacy -Imaging: x-ray reviewed, shows no signs of erosions, osteolysis, osteomyelitis or emphysema.  Procedure: Excisional Debridement of Wound Rationale: Removal of non-viable soft tissue from the wound to promote healing.  Anesthesia: none Post-Debridement Wound Measurements: They are noted above Type of Debridement: Sharp Excisional Tissue Removed: Non-viable soft tissue Depth of Debridement: subcutaneous tissue. Technique: Sharp excisional debridement to bleeding, viable wound base.  Dressing: Dry, sterile, compression dressing. Disposition: Patient tolerated procedure well.        Return in about 1 week (around 11/06/2022) for wound care.

## 2022-11-01 ENCOUNTER — Telehealth: Payer: Self-pay

## 2022-11-01 NOTE — Telephone Encounter (Signed)
Remote ICM transmission received.  Attempted call to patient regarding ICM remote transmission and left detailed message per DPR.  Left ICM phone number and advised to return call for any fluid symptoms or questions. Next ICM remote transmission scheduled 12/03/2022.

## 2022-11-01 NOTE — Progress Notes (Signed)
EPIC Encounter for ICM Monitoring  Patient Name: Donna Hopkins is a 67 y.o. female Date: 11/01/2022 Primary Care Physican: Dennard Schaumann, MD Primary Cardiologist: Hanley Hays Electrophysiologist: Townsend Roger Pacing: 98%         03/19/2022 Office Weight: 225 lbs   AT/AF Burden:  0% (takes Warfarin)                                                                 Attempted call to patient and unable to reach.   Transmission reviewed.           Diet:  Does not follow low salt diet.  Eats a lot of ice and drinks > 64 oz daily   Corvue Thoracic impedance suggesting intermittent days with possible fluid accumulation alternating with days of possible dryness.   Prescribed: Furosemide 40 mg Take 1 tablet (40 mg total) by mouth twice a day   Potassium 20 mEq take 1 tablet daily.   Labs: 06/10/2022 Creatinine 0.76, BUN 13, Potassium 3.5, Sodium 133, GFR >60 A complete set of results can be found in Results Review.   Recommendations:   Unable to reach.     Follow-up plan: ICM clinic phone appointment on 12/03/2022.   91 day device clinic remote transmission 01/21/2023     EP/Cardiology Office Visits:  03/12/2023 with Dr Lalla Brothers.     Copy of ICM check sent to Dr. Lalla Brothers.      3 month ICM trend: 10/29/2022.    12-14 Month ICM trend:     Karie Soda, RN 11/01/2022 3:28 PM

## 2022-11-03 ENCOUNTER — Emergency Department (HOSPITAL_COMMUNITY): Payer: 59

## 2022-11-03 ENCOUNTER — Encounter (HOSPITAL_COMMUNITY): Payer: Self-pay

## 2022-11-03 ENCOUNTER — Inpatient Hospital Stay (HOSPITAL_COMMUNITY)
Admission: EM | Admit: 2022-11-03 | Discharge: 2022-11-10 | DRG: 871 | Disposition: A | Discharge status: Home Health | Payer: 59 | Attending: Internal Medicine | Admitting: Internal Medicine

## 2022-11-03 ENCOUNTER — Other Ambulatory Visit: Payer: Self-pay

## 2022-11-03 DIAGNOSIS — Y92009 Unspecified place in unspecified non-institutional (private) residence as the place of occurrence of the external cause: Secondary | ICD-10-CM

## 2022-11-03 DIAGNOSIS — Z83719 Family history of colon polyps, unspecified: Secondary | ICD-10-CM

## 2022-11-03 DIAGNOSIS — I6523 Occlusion and stenosis of bilateral carotid arteries: Secondary | ICD-10-CM | POA: Diagnosis present

## 2022-11-03 DIAGNOSIS — Z86010 Personal history of colonic polyps: Secondary | ICD-10-CM

## 2022-11-03 DIAGNOSIS — F32A Depression, unspecified: Secondary | ICD-10-CM | POA: Diagnosis present

## 2022-11-03 DIAGNOSIS — Z4502 Encounter for adjustment and management of automatic implantable cardiac defibrillator: Secondary | ICD-10-CM

## 2022-11-03 DIAGNOSIS — E11621 Type 2 diabetes mellitus with foot ulcer: Secondary | ICD-10-CM | POA: Diagnosis present

## 2022-11-03 DIAGNOSIS — I609 Nontraumatic subarachnoid hemorrhage, unspecified: Principal | ICD-10-CM

## 2022-11-03 DIAGNOSIS — M810 Age-related osteoporosis without current pathological fracture: Secondary | ICD-10-CM | POA: Diagnosis present

## 2022-11-03 DIAGNOSIS — Z87891 Personal history of nicotine dependence: Secondary | ICD-10-CM

## 2022-11-03 DIAGNOSIS — N1831 Chronic kidney disease, stage 3a: Secondary | ICD-10-CM | POA: Diagnosis present

## 2022-11-03 DIAGNOSIS — E11628 Type 2 diabetes mellitus with other skin complications: Secondary | ICD-10-CM | POA: Diagnosis present

## 2022-11-03 DIAGNOSIS — Z833 Family history of diabetes mellitus: Secondary | ICD-10-CM

## 2022-11-03 DIAGNOSIS — Z794 Long term (current) use of insulin: Secondary | ICD-10-CM

## 2022-11-03 DIAGNOSIS — Z83438 Family history of other disorder of lipoprotein metabolism and other lipidemia: Secondary | ICD-10-CM

## 2022-11-03 DIAGNOSIS — Z7985 Long-term (current) use of injectable non-insulin antidiabetic drugs: Secondary | ICD-10-CM

## 2022-11-03 DIAGNOSIS — Z7901 Long term (current) use of anticoagulants: Secondary | ICD-10-CM

## 2022-11-03 DIAGNOSIS — S066XAA Traumatic subarachnoid hemorrhage with loss of consciousness status unknown, initial encounter: Secondary | ICD-10-CM | POA: Diagnosis present

## 2022-11-03 DIAGNOSIS — F419 Anxiety disorder, unspecified: Secondary | ICD-10-CM | POA: Diagnosis present

## 2022-11-03 DIAGNOSIS — Z952 Presence of prosthetic heart valve: Secondary | ICD-10-CM

## 2022-11-03 DIAGNOSIS — Z79899 Other long term (current) drug therapy: Secondary | ICD-10-CM

## 2022-11-03 DIAGNOSIS — Z95810 Presence of automatic (implantable) cardiac defibrillator: Secondary | ICD-10-CM

## 2022-11-03 DIAGNOSIS — Z90710 Acquired absence of both cervix and uterus: Secondary | ICD-10-CM

## 2022-11-03 DIAGNOSIS — A419 Sepsis, unspecified organism: Secondary | ICD-10-CM | POA: Diagnosis not present

## 2022-11-03 DIAGNOSIS — E872 Acidosis, unspecified: Secondary | ICD-10-CM | POA: Diagnosis not present

## 2022-11-03 DIAGNOSIS — I272 Pulmonary hypertension, unspecified: Secondary | ICD-10-CM | POA: Diagnosis present

## 2022-11-03 DIAGNOSIS — E1122 Type 2 diabetes mellitus with diabetic chronic kidney disease: Secondary | ICD-10-CM | POA: Diagnosis present

## 2022-11-03 DIAGNOSIS — E782 Mixed hyperlipidemia: Secondary | ICD-10-CM | POA: Diagnosis present

## 2022-11-03 DIAGNOSIS — Z7984 Long term (current) use of oral hypoglycemic drugs: Secondary | ICD-10-CM

## 2022-11-03 DIAGNOSIS — I130 Hypertensive heart and chronic kidney disease with heart failure and stage 1 through stage 4 chronic kidney disease, or unspecified chronic kidney disease: Secondary | ICD-10-CM | POA: Diagnosis present

## 2022-11-03 DIAGNOSIS — E1151 Type 2 diabetes mellitus with diabetic peripheral angiopathy without gangrene: Secondary | ICD-10-CM | POA: Diagnosis present

## 2022-11-03 DIAGNOSIS — R6520 Severe sepsis without septic shock: Secondary | ICD-10-CM | POA: Diagnosis present

## 2022-11-03 DIAGNOSIS — I472 Ventricular tachycardia, unspecified: Secondary | ICD-10-CM | POA: Diagnosis present

## 2022-11-03 DIAGNOSIS — N179 Acute kidney failure, unspecified: Secondary | ICD-10-CM | POA: Diagnosis present

## 2022-11-03 DIAGNOSIS — Z887 Allergy status to serum and vaccine status: Secondary | ICD-10-CM

## 2022-11-03 DIAGNOSIS — K219 Gastro-esophageal reflux disease without esophagitis: Secondary | ICD-10-CM | POA: Diagnosis present

## 2022-11-03 DIAGNOSIS — L97522 Non-pressure chronic ulcer of other part of left foot with fat layer exposed: Secondary | ICD-10-CM | POA: Diagnosis present

## 2022-11-03 DIAGNOSIS — Z888 Allergy status to other drugs, medicaments and biological substances status: Secondary | ICD-10-CM

## 2022-11-03 DIAGNOSIS — I5022 Chronic systolic (congestive) heart failure: Secondary | ICD-10-CM | POA: Diagnosis present

## 2022-11-03 DIAGNOSIS — I959 Hypotension, unspecified: Secondary | ICD-10-CM | POA: Diagnosis present

## 2022-11-03 DIAGNOSIS — L03116 Cellulitis of left lower limb: Secondary | ICD-10-CM | POA: Diagnosis present

## 2022-11-03 DIAGNOSIS — E669 Obesity, unspecified: Secondary | ICD-10-CM | POA: Diagnosis present

## 2022-11-03 DIAGNOSIS — Z6832 Body mass index (BMI) 32.0-32.9, adult: Secondary | ICD-10-CM

## 2022-11-03 DIAGNOSIS — S065XAA Traumatic subdural hemorrhage with loss of consciousness status unknown, initial encounter: Secondary | ICD-10-CM | POA: Diagnosis present

## 2022-11-03 DIAGNOSIS — W109XXA Fall (on) (from) unspecified stairs and steps, initial encounter: Secondary | ICD-10-CM | POA: Diagnosis present

## 2022-11-03 DIAGNOSIS — E871 Hypo-osmolality and hyponatremia: Secondary | ICD-10-CM | POA: Diagnosis present

## 2022-11-03 DIAGNOSIS — E876 Hypokalemia: Secondary | ICD-10-CM | POA: Diagnosis present

## 2022-11-03 DIAGNOSIS — Z8249 Family history of ischemic heart disease and other diseases of the circulatory system: Secondary | ICD-10-CM

## 2022-11-03 DIAGNOSIS — Z818 Family history of other mental and behavioral disorders: Secondary | ICD-10-CM

## 2022-11-03 DIAGNOSIS — E1165 Type 2 diabetes mellitus with hyperglycemia: Secondary | ICD-10-CM | POA: Diagnosis present

## 2022-11-03 LAB — CBC WITH DIFFERENTIAL/PLATELET
Abs Immature Granulocytes: 0.05 10*3/uL (ref 0.00–0.07)
Basophils Absolute: 0 10*3/uL (ref 0.0–0.1)
Basophils Relative: 0 %
Eosinophils Absolute: 0 10*3/uL (ref 0.0–0.5)
Eosinophils Relative: 0 %
HCT: 36.8 % (ref 36.0–46.0)
Hemoglobin: 11.7 g/dL — ABNORMAL LOW (ref 12.0–15.0)
Immature Granulocytes: 0 %
Lymphocytes Relative: 22 %
Lymphs Abs: 2.6 10*3/uL (ref 0.7–4.0)
MCH: 28.5 pg (ref 26.0–34.0)
MCHC: 31.8 g/dL (ref 30.0–36.0)
MCV: 89.8 fL (ref 80.0–100.0)
Monocytes Absolute: 1 10*3/uL (ref 0.1–1.0)
Monocytes Relative: 8 %
Neutro Abs: 8 10*3/uL — ABNORMAL HIGH (ref 1.7–7.7)
Neutrophils Relative %: 70 %
Platelets: 372 10*3/uL (ref 150–400)
RBC: 4.1 MIL/uL (ref 3.87–5.11)
RDW: 14.1 % (ref 11.5–15.5)
WBC: 11.7 10*3/uL — ABNORMAL HIGH (ref 4.0–10.5)
nRBC: 0 % (ref 0.0–0.2)

## 2022-11-03 LAB — URINALYSIS, W/ REFLEX TO CULTURE (INFECTION SUSPECTED)
Bacteria, UA: NONE SEEN
Bilirubin Urine: NEGATIVE
Glucose, UA: >=500 mg/dL — AB
Hgb urine dipstick: NEGATIVE
Ketones, ur: 20 mg/dL — AB
Leukocytes,Ua: NEGATIVE
Nitrite: NEGATIVE
Protein, ur: NEGATIVE mg/dL
Specific Gravity, Urine: 1.028 (ref 1.005–1.030)
pH: 7 (ref 5.0–8.0)

## 2022-11-03 LAB — COMPREHENSIVE METABOLIC PANEL
ALT: 18 U/L (ref 0–44)
AST: 25 U/L (ref 15–41)
Albumin: 3.5 g/dL (ref 3.5–5.0)
Alkaline Phosphatase: 103 U/L (ref 38–126)
Anion gap: 13 (ref 5–15)
BUN: 10 mg/dL (ref 8–23)
CO2: 26 mmol/L (ref 22–32)
Calcium: 9.1 mg/dL (ref 8.9–10.3)
Chloride: 94 mmol/L — ABNORMAL LOW (ref 98–111)
Creatinine, Ser: 1.19 mg/dL — ABNORMAL HIGH (ref 0.44–1.00)
GFR, Estimated: 49 mL/min — ABNORMAL LOW (ref >60)
Glucose, Bld: 195 mg/dL — ABNORMAL HIGH (ref 70–99)
Potassium: 3.2 mmol/L — ABNORMAL LOW (ref 3.5–5.1)
Sodium: 133 mmol/L — ABNORMAL LOW (ref 135–145)
Total Bilirubin: 1.1 mg/dL (ref 0.3–1.2)
Total Protein: 8.4 g/dL — ABNORMAL HIGH (ref 6.5–8.1)

## 2022-11-03 LAB — PROTIME-INR
INR: 4.1 (ref 0.8–1.2)
Prothrombin Time: 39.7 seconds — ABNORMAL HIGH (ref 11.4–15.2)

## 2022-11-03 LAB — I-STAT CG4 LACTIC ACID, ED
Lactic Acid, Venous: 2.8 mmol/L (ref 0.5–1.9)
Lactic Acid, Venous: 3.1 mmol/L (ref 0.5–1.9)

## 2022-11-03 NOTE — ED Provider Notes (Signed)
[place] EMERGENCY DEPARTMENT AT [facility] Provider Note   CSN: [id number] Arrival date & time: 11/03/22  1953     History {Add pertinent medical, surgical, social history, OB history to HPI:1} Chief Complaint  Patient presents with   Donna Hopkins is a 67 y.o. female.  HPI     Home Medications Prior to Admission medications   Medication Sig Start Date End Date Taking? Authorizing Provider  acetaminophen (TYLENOL) 650 MG CR tablet Take 1,300 mg by mouth daily as needed for pain.    [provider]  albuterol (PROVENTIL HFA;VENTOLIN HFA) 108 (90 BASE) MCG/ACT inhaler Inhale 2 puffs into the lungs every 6 (six) hours as needed for wheezing or shortness of breath.     [provider]  ALPRAZolam Prudy Feeler) 0.5 MG tablet Take 0.5 mg by mouth at bedtime as needed for anxiety or sleep.    [provider]  amoxicillin-clavulanate (AUGMENTIN) 875-125 MG tablet Take 1 tablet by mouth 2 (two) times daily. 10/30/22   McDonald, Rachelle Hora, DPM  atorvastatin (LIPITOR) 40 MG tablet Take 40 mg by mouth daily.    [provider]  carvedilol (COREG) 25 MG tablet Take 25 mg by mouth 2 (two) times daily with a meal.    [provider]  cyclobenzaprine (FLEXERIL) 10 MG tablet Take 10 mg by mouth 3 (three) times daily.    [provider]  ferrous sulfate 325 (65 FE) MG tablet Take 325 mg by mouth daily with breakfast.    [provider]  fluticasone (FLONASE) 50 MCG/ACT nasal spray Place 1 spray into both nostrils daily as needed for allergies.     [provider]  furosemide (LASIX) 40 MG tablet Take 40 mg by mouth 2 (two) times daily. 01/03/22   [provider]  gabapentin (NEURONTIN) 300 MG capsule Take 300 mg by mouth 3 (three) times daily.    [provider]  gentamicin cream (GARAMYCIN) 0.1 % Apply 1 Application topically 2 (two) times daily. 10/23/21   Felecia Shelling, DPM  insulin aspart  (NOVOLOG) 100 UNIT/ML injection Inject 25 Units into the skin 3 (three) times daily before meals.    [provider]  JARDIANCE 25 MG TABS tablet Take 1 tablet by mouth daily. 05/30/18   [provider]  LEVEMIR FLEXTOUCH 100 UNIT/ML Pen Inject 40 Units into the skin 2 (two) times daily. 01/04/18   [provider]  mupirocin ointment (BACTROBAN) 2 % Apply 1 Application topically 2 (two) times daily. 09/12/22   Felecia Shelling, DPM  omeprazole (PRILOSEC) 40 MG capsule Take 40 mg by mouth daily.  01/16/18   [provider]  oxyCODONE-acetaminophen (PERCOCET) 10-325 MG per tablet Take 1 tablet by mouth 3 (three) times daily.    [provider]  OZEMPIC, 0.25 OR 0.5 MG/DOSE, 2 MG/1.5ML SOPN Inject 0.5 mg into the skin every Wednesday.  12/30/17   [provider]  potassium chloride SA (K-DUR,KLOR-CON) 20 MEQ tablet Take 20 mEq by mouth daily.    [provider]  ramipril (ALTACE) 10 MG capsule Take 2 capsules by mouth daily.    [provider]  sertraline (ZOLOFT) 100 MG tablet Take 100 mg by mouth in the morning and at bedtime.    [provider]  triamcinolone cream (KENALOG) 0.1 % Apply 1 application topically 2 (two) times daily as needed (dry skin).     [provider]  warfarin (COUMADIN) 7.5 MG tablet  Take 7.5 mg by mouth daily at 4 PM. 08/13/22   [provider]      Allergies    Canagliflozin, Metformin, and Tetanus toxoids    Review of Systems   Review of Systems  Physical Exam Updated Vital Signs BP (!) 155/79   Pulse (!) 120   Temp 98 F (36.7 C)   Resp 18   SpO2 97%  Physical Exam  ED Results / Procedures / Treatments   Labs (all labs ordered are listed, but only abnormal results are displayed) Labs Reviewed  COMPREHENSIVE METABOLIC PANEL - Abnormal; Notable for the following components:      Result Value   Sodium 133 (*)    Potassium 3.2 (*)    Chloride 94 (*)    Glucose, Bld  195 (*)    Creatinine, Ser 1.19 (*)    Total Protein 8.4 (*)    GFR, Estimated 49 (*)    All other components within normal limits  CBC WITH DIFFERENTIAL/PLATELET - Abnormal; Notable for the following components:   WBC 11.7 (*)    Hemoglobin 11.7 (*)    Neutro Abs 8.0 (*)    All other components within normal limits  PROTIME-INR - Abnormal; Notable for the following components:   Prothrombin Time 39.7 (*)    INR 4.1 (*)    All other components within normal limits  URINALYSIS, W/ REFLEX TO CULTURE (INFECTION SUSPECTED) - Abnormal; Notable for the following components:   APPearance HAZY (*)    Glucose, UA >=500 (*)    Ketones, ur 20 (*)    All other components within normal limits  I-STAT CG4 LACTIC ACID, ED - Abnormal; Notable for the following components:   Lactic Acid, Venous 2.8 (*)    All other components within normal limits  I-STAT CG4 LACTIC ACID, ED - Abnormal; Notable for the following components:   Lactic Acid, Venous 3.1 (*)    All other components within normal limits  CULTURE, BLOOD (ROUTINE X 2)  CULTURE, BLOOD (ROUTINE X 2)    EKG None  Radiology DG Chest 2 View  Result Date: 11/03/2022 CLINICAL DATA:  Suspected sepsis.  Fall. EXAM: CHEST - 2 VIEW COMPARISON:  06/10/2022 FINDINGS: Patient's chin obscures the apices on the frontal view. Prior median sternotomy with prosthetic cardiac valve. Left-sided pacemaker in place. No focal airspace disease, pleural effusion, pulmonary edema or pneumothorax. Thoracic spondylosis. Safety pin projects over the left clavicle. IMPRESSION: No acute chest findings. Electronically Signed   By: Narda Rutherford M.D.   On: 11/03/2022 21:42    Procedures Procedures  {Document cardiac monitor, telemetry assessment procedure when appropriate:1}  Medications Ordered in ED Medications - No data to display  ED Course/ Medical Decision Making/ A&P   {   Click here for ABCD2, HEART and other calculatorsREFRESH Note before signing :1}                               Medical Decision Making Amount and/or Complexity of Data Reviewed Labs: ordered. Radiology: ordered.   ***  {Document critical care time when appropriate:1} {Document review of labs and clinical decision tools ie heart score, Chads2Vasc2 etc:1}  {Document your independent review of radiology images, and any outside records:1} {Document your discussion with family members, caretakers, and with consultants:1} {Document social determinants of health affecting pt's care:1} {Document your decision making why or why not admission, treatments were needed:1} Final Clinical Impression(s) / ED  Diagnoses Final diagnoses:  None    Rx / DC Orders ED Discharge Orders     None

## 2022-11-03 NOTE — ED Triage Notes (Signed)
Pt to ED by POV from home following a fall which occurred yesterday. Pt endorses striking her head on the wall, arrives with a large hematoma to the left side of her hea, denies LOC. Pt also endorses taking coumadin. Pt states she has an infection to her left foot also that she needs antibiotics for. Arrives A+O, VSS.

## 2022-11-04 ENCOUNTER — Emergency Department (HOSPITAL_COMMUNITY): Payer: 59

## 2022-11-04 ENCOUNTER — Encounter (HOSPITAL_COMMUNITY): Payer: Self-pay | Admitting: Family Medicine

## 2022-11-04 DIAGNOSIS — I272 Pulmonary hypertension, unspecified: Secondary | ICD-10-CM | POA: Diagnosis present

## 2022-11-04 DIAGNOSIS — I472 Ventricular tachycardia, unspecified: Secondary | ICD-10-CM

## 2022-11-04 DIAGNOSIS — I5022 Chronic systolic (congestive) heart failure: Secondary | ICD-10-CM | POA: Diagnosis present

## 2022-11-04 DIAGNOSIS — E1151 Type 2 diabetes mellitus with diabetic peripheral angiopathy without gangrene: Secondary | ICD-10-CM | POA: Diagnosis present

## 2022-11-04 DIAGNOSIS — E11628 Type 2 diabetes mellitus with other skin complications: Secondary | ICD-10-CM

## 2022-11-04 DIAGNOSIS — L97909 Non-pressure chronic ulcer of unspecified part of unspecified lower leg with unspecified severity: Secondary | ICD-10-CM | POA: Diagnosis not present

## 2022-11-04 DIAGNOSIS — N179 Acute kidney failure, unspecified: Secondary | ICD-10-CM | POA: Diagnosis present

## 2022-11-04 DIAGNOSIS — L089 Local infection of the skin and subcutaneous tissue, unspecified: Secondary | ICD-10-CM

## 2022-11-04 DIAGNOSIS — R6520 Severe sepsis without septic shock: Secondary | ICD-10-CM

## 2022-11-04 DIAGNOSIS — I609 Nontraumatic subarachnoid hemorrhage, unspecified: Secondary | ICD-10-CM | POA: Diagnosis present

## 2022-11-04 DIAGNOSIS — Y92009 Unspecified place in unspecified non-institutional (private) residence as the place of occurrence of the external cause: Secondary | ICD-10-CM | POA: Diagnosis not present

## 2022-11-04 DIAGNOSIS — E1165 Type 2 diabetes mellitus with hyperglycemia: Secondary | ICD-10-CM | POA: Diagnosis present

## 2022-11-04 DIAGNOSIS — S065XAA Traumatic subdural hemorrhage with loss of consciousness status unknown, initial encounter: Secondary | ICD-10-CM | POA: Diagnosis present

## 2022-11-04 DIAGNOSIS — L97522 Non-pressure chronic ulcer of other part of left foot with fat layer exposed: Secondary | ICD-10-CM

## 2022-11-04 DIAGNOSIS — E1122 Type 2 diabetes mellitus with diabetic chronic kidney disease: Secondary | ICD-10-CM | POA: Diagnosis present

## 2022-11-04 DIAGNOSIS — L0390 Cellulitis, unspecified: Secondary | ICD-10-CM

## 2022-11-04 DIAGNOSIS — Z794 Long term (current) use of insulin: Secondary | ICD-10-CM | POA: Diagnosis not present

## 2022-11-04 DIAGNOSIS — Z952 Presence of prosthetic heart valve: Secondary | ICD-10-CM | POA: Diagnosis not present

## 2022-11-04 DIAGNOSIS — E872 Acidosis, unspecified: Secondary | ICD-10-CM | POA: Diagnosis not present

## 2022-11-04 DIAGNOSIS — S066XAA Traumatic subarachnoid hemorrhage with loss of consciousness status unknown, initial encounter: Secondary | ICD-10-CM | POA: Diagnosis present

## 2022-11-04 DIAGNOSIS — I959 Hypotension, unspecified: Secondary | ICD-10-CM | POA: Diagnosis present

## 2022-11-04 DIAGNOSIS — E871 Hypo-osmolality and hyponatremia: Secondary | ICD-10-CM | POA: Diagnosis present

## 2022-11-04 DIAGNOSIS — W109XXA Fall (on) (from) unspecified stairs and steps, initial encounter: Secondary | ICD-10-CM | POA: Diagnosis present

## 2022-11-04 DIAGNOSIS — A419 Sepsis, unspecified organism: Secondary | ICD-10-CM | POA: Diagnosis present

## 2022-11-04 DIAGNOSIS — I6523 Occlusion and stenosis of bilateral carotid arteries: Secondary | ICD-10-CM | POA: Diagnosis present

## 2022-11-04 DIAGNOSIS — N1831 Chronic kidney disease, stage 3a: Secondary | ICD-10-CM | POA: Diagnosis present

## 2022-11-04 DIAGNOSIS — F32A Depression, unspecified: Secondary | ICD-10-CM | POA: Diagnosis present

## 2022-11-04 DIAGNOSIS — Z6832 Body mass index (BMI) 32.0-32.9, adult: Secondary | ICD-10-CM | POA: Diagnosis not present

## 2022-11-04 DIAGNOSIS — I359 Nonrheumatic aortic valve disorder, unspecified: Secondary | ICD-10-CM | POA: Diagnosis not present

## 2022-11-04 DIAGNOSIS — L03116 Cellulitis of left lower limb: Secondary | ICD-10-CM | POA: Diagnosis present

## 2022-11-04 DIAGNOSIS — I130 Hypertensive heart and chronic kidney disease with heart failure and stage 1 through stage 4 chronic kidney disease, or unspecified chronic kidney disease: Secondary | ICD-10-CM | POA: Diagnosis present

## 2022-11-04 LAB — TYPE AND SCREEN
ABO/RH(D): A POS
Antibody Screen: NEGATIVE

## 2022-11-04 LAB — HEPATIC FUNCTION PANEL
ALT: 13 U/L (ref 0–44)
AST: 17 U/L (ref 15–41)
Albumin: 2.6 g/dL — ABNORMAL LOW (ref 3.5–5.0)
Alkaline Phosphatase: 78 U/L (ref 38–126)
Bilirubin, Direct: 0.1 mg/dL (ref 0.0–0.2)
Indirect Bilirubin: 0.8 mg/dL (ref 0.3–0.9)
Total Bilirubin: 0.9 mg/dL (ref 0.3–1.2)
Total Protein: 6.5 g/dL (ref 6.5–8.1)

## 2022-11-04 LAB — BASIC METABOLIC PANEL
Anion gap: 13 (ref 5–15)
BUN: 11 mg/dL (ref 8–23)
CO2: 23 mmol/L (ref 22–32)
Calcium: 8.2 mg/dL — ABNORMAL LOW (ref 8.9–10.3)
Chloride: 96 mmol/L — ABNORMAL LOW (ref 98–111)
Creatinine, Ser: 0.9 mg/dL (ref 0.44–1.00)
GFR, Estimated: >60 mL/min (ref >60)
Glucose, Bld: 156 mg/dL — ABNORMAL HIGH (ref 70–99)
Potassium: 3.2 mmol/L — ABNORMAL LOW (ref 3.5–5.1)
Sodium: 132 mmol/L — ABNORMAL LOW (ref 135–145)

## 2022-11-04 LAB — HEMOGLOBIN A1C
Hgb A1c MFr Bld: 7.8 % — ABNORMAL HIGH (ref 4.8–5.6)
Mean Plasma Glucose: 177.16 mg/dL

## 2022-11-04 LAB — TSH: TSH: 1.534 u[IU]/mL (ref 0.350–4.500)

## 2022-11-04 LAB — GLUCOSE, CAPILLARY
Glucose-Capillary: 143 mg/dL — ABNORMAL HIGH (ref 70–99)
Glucose-Capillary: 167 mg/dL — ABNORMAL HIGH (ref 70–99)
Glucose-Capillary: 207 mg/dL — ABNORMAL HIGH (ref 70–99)
Glucose-Capillary: 175 mg/dL — ABNORMAL HIGH (ref 70–99)
Glucose-Capillary: 210 mg/dL — ABNORMAL HIGH (ref 70–99)

## 2022-11-04 LAB — LACTIC ACID, PLASMA
Lactic Acid, Venous: 2.2 mmol/L (ref 0.5–1.9)
Lactic Acid, Venous: 1.8 mmol/L (ref 0.5–1.9)

## 2022-11-04 LAB — MAGNESIUM: Magnesium: 1.4 mg/dL — ABNORMAL LOW (ref 1.7–2.4)

## 2022-11-04 LAB — HIV ANTIBODY (ROUTINE TESTING W REFLEX): HIV Screen 4th Generation wRfx: NONREACTIVE

## 2022-11-04 LAB — ABO/RH: ABO/RH(D): A POS

## 2022-11-04 LAB — CK: Total CK: 98 U/L (ref 38–234)

## 2022-11-04 MED ORDER — SENNOSIDES-DOCUSATE SODIUM 8.6-50 MG PO TABS: 1.0000 | ORAL_TABLET | Freq: Every evening | ORAL | Status: DC | PRN

## 2022-11-04 MED ORDER — POTASSIUM CHLORIDE 20 MEQ PO PACK
40.0000 meq | PACK | ORAL | Status: AC
  Administered 2022-11-04 (×2): 40 meq via ORAL
  Filled 2022-11-04: qty 2

## 2022-11-04 MED ORDER — IPRATROPIUM-ALBUTEROL 0.5-2.5 (3) MG/3ML IN SOLN: 3.0000 mL | Freq: Four times a day (QID) | RESPIRATORY_TRACT | Status: DC | PRN

## 2022-11-04 MED ORDER — ATORVASTATIN CALCIUM 10 MG PO TABS
10.0000 mg | ORAL_TABLET | Freq: Every day | ORAL | Status: DC
  Administered 2022-11-04 – 2022-11-10 (×7): 10 mg via ORAL
  Filled 2022-11-04 (×7): qty 1

## 2022-11-04 MED ORDER — MAGNESIUM SULFATE 4 GM/100ML IV SOLN
4.0000 g | Freq: Once | INTRAVENOUS | Status: AC
  Administered 2022-11-04: 4 g via INTRAVENOUS
  Filled 2022-11-04: qty 100

## 2022-11-04 MED ORDER — OXYCODONE HCL 5 MG PO TABS
5.0000 mg | ORAL_TABLET | Freq: Four times a day (QID) | ORAL | Status: DC | PRN
  Administered 2022-11-04 – 2022-11-10 (×19): 5 mg via ORAL
  Filled 2022-11-04 (×19): qty 1

## 2022-11-04 MED ORDER — INSULIN DETEMIR 100 UNIT/ML ~~LOC~~ SOLN
40.0000 [IU] | Freq: Two times a day (BID) | SUBCUTANEOUS | Status: DC
  Administered 2022-11-04 – 2022-11-10 (×13): 40 [IU] via SUBCUTANEOUS
  Filled 2022-11-04 (×14): qty 0.4

## 2022-11-04 MED ORDER — FUROSEMIDE 40 MG PO TABS
80.0000 mg | ORAL_TABLET | Freq: Every day | ORAL | Status: DC
  Administered 2022-11-04 – 2022-11-10 (×7): 80 mg via ORAL
  Filled 2022-11-04 (×7): qty 2

## 2022-11-04 MED ORDER — RAMIPRIL 5 MG PO CAPS
20.0000 mg | ORAL_CAPSULE | Freq: Every day | ORAL | Status: DC
  Administered 2022-11-04 – 2022-11-09 (×6): 20 mg via ORAL
  Filled 2022-11-04 (×8): qty 4

## 2022-11-04 MED ORDER — VANCOMYCIN HCL IN DEXTROSE 1-5 GM/200ML-% IV SOLN
1000.0000 mg | Freq: Once | INTRAVENOUS | Status: DC
  Filled 2022-11-04: qty 200

## 2022-11-04 MED ORDER — ALPRAZOLAM 0.5 MG PO TABS
0.5000 mg | ORAL_TABLET | Freq: Two times a day (BID) | ORAL | Status: DC | PRN
  Administered 2022-11-05 – 2022-11-09 (×8): 0.5 mg via ORAL
  Filled 2022-11-04 (×9): qty 1

## 2022-11-04 MED ORDER — INSULIN ASPART 100 UNIT/ML IJ SOLN
0.0000 [IU] | Freq: Three times a day (TID) | INTRAMUSCULAR | Status: DC
  Administered 2022-11-04: 5 [IU] via SUBCUTANEOUS
  Administered 2022-11-04 (×2): 3 [IU] via SUBCUTANEOUS
  Administered 2022-11-05 (×2): 2 [IU] via SUBCUTANEOUS
  Administered 2022-11-06 – 2022-11-07 (×2): 5 [IU] via SUBCUTANEOUS
  Administered 2022-11-07 (×2): 2 [IU] via SUBCUTANEOUS
  Administered 2022-11-08 (×2): 3 [IU] via SUBCUTANEOUS
  Administered 2022-11-09: 2 [IU] via SUBCUTANEOUS
  Administered 2022-11-09: 5 [IU] via SUBCUTANEOUS
  Administered 2022-11-10: 2 [IU] via SUBCUTANEOUS

## 2022-11-04 MED ORDER — OXYCODONE-ACETAMINOPHEN 10-325 MG PO TABS: 1.0000 | ORAL_TABLET | Freq: Four times a day (QID) | ORAL | Status: DC | PRN

## 2022-11-04 MED ORDER — AMIODARONE HCL IN DEXTROSE 360-4.14 MG/200ML-% IV SOLN
60.0000 mg/h | INTRAVENOUS | Status: AC
  Administered 2022-11-04 (×2): 60 mg/h via INTRAVENOUS

## 2022-11-04 MED ORDER — CARVEDILOL 25 MG PO TABS
25.0000 mg | ORAL_TABLET | Freq: Two times a day (BID) | ORAL | Status: DC
  Administered 2022-11-04 – 2022-11-10 (×13): 25 mg via ORAL
  Filled 2022-11-04 (×13): qty 1

## 2022-11-04 MED ORDER — METRONIDAZOLE 500 MG/100ML IV SOLN
500.0000 mg | Freq: Once | INTRAVENOUS | Status: AC
  Administered 2022-11-04: 500 mg via INTRAVENOUS
  Filled 2022-11-04: qty 100

## 2022-11-04 MED ORDER — SODIUM CHLORIDE 0.9 % IV SOLN
2.0000 g | Freq: Three times a day (TID) | INTRAVENOUS | Status: DC
  Administered 2022-11-04 – 2022-11-06 (×6): 2 g via INTRAVENOUS
  Filled 2022-11-04 (×6): qty 12.5

## 2022-11-04 MED ORDER — MAGNESIUM SULFATE 2 GM/50ML IV SOLN
2.0000 g | Freq: Once | INTRAVENOUS | Status: DC
Start: 2022-11-04 — End: 2022-11-04

## 2022-11-04 MED ORDER — AMIODARONE LOAD VIA INFUSION
150.0000 mg | Freq: Once | INTRAVENOUS | Status: AC
  Administered 2022-11-04: 150 mg via INTRAVENOUS
  Filled 2022-11-04: qty 83.34

## 2022-11-04 MED ORDER — VANCOMYCIN HCL 1500 MG/300ML IV SOLN
1500.0000 mg | INTRAVENOUS | Status: DC
  Administered 2022-11-05 – 2022-11-07 (×3): 1500 mg via INTRAVENOUS
  Filled 2022-11-04 (×3): qty 300

## 2022-11-04 MED ORDER — PANTOPRAZOLE SODIUM 40 MG PO TBEC
40.0000 mg | DELAYED_RELEASE_TABLET | Freq: Every day | ORAL | Status: DC
  Administered 2022-11-04 – 2022-11-10 (×7): 40 mg via ORAL
  Filled 2022-11-04 (×7): qty 1

## 2022-11-04 MED ORDER — METRONIDAZOLE 500 MG/100ML IV SOLN
500.0000 mg | Freq: Two times a day (BID) | INTRAVENOUS | Status: DC
  Administered 2022-11-04 – 2022-11-07 (×7): 500 mg via INTRAVENOUS
  Filled 2022-11-04 (×7): qty 100

## 2022-11-04 MED ORDER — SODIUM CHLORIDE 0.9% FLUSH
3.0000 mL | Freq: Two times a day (BID) | INTRAVENOUS | Status: DC
  Administered 2022-11-04 – 2022-11-10 (×12): 3 mL via INTRAVENOUS

## 2022-11-04 MED ORDER — HYDRALAZINE HCL 20 MG/ML IJ SOLN: 5.0000 mg | Freq: Three times a day (TID) | INTRAMUSCULAR | Status: DC | PRN

## 2022-11-04 MED ORDER — ACETAMINOPHEN 650 MG RE SUPP: 650.0000 mg | Freq: Four times a day (QID) | RECTAL | Status: DC | PRN

## 2022-11-04 MED ORDER — AMIODARONE HCL IN DEXTROSE 360-4.14 MG/200ML-% IV SOLN
30.0000 mg/h | INTRAVENOUS | Status: DC
  Administered 2022-11-04 – 2022-11-06 (×4): 30 mg/h via INTRAVENOUS
  Filled 2022-11-04 (×5): qty 200

## 2022-11-04 MED ORDER — ONDANSETRON HCL 4 MG/2ML IJ SOLN
4.0000 mg | Freq: Four times a day (QID) | INTRAMUSCULAR | Status: DC | PRN
  Administered 2022-11-08: 4 mg via INTRAVENOUS
  Filled 2022-11-04: qty 2

## 2022-11-04 MED ORDER — LACTATED RINGERS IV BOLUS (SEPSIS)
1000.0000 mL | Freq: Once | INTRAVENOUS | Status: AC
  Administered 2022-11-04: 1000 mL via INTRAVENOUS

## 2022-11-04 MED ORDER — SODIUM CHLORIDE 0.9 % IV SOLN: 2.0000 g | Freq: Once | INTRAVENOUS | Status: DC

## 2022-11-04 MED ORDER — LACTATED RINGERS IV BOLUS (SEPSIS)
500.0000 mL | Freq: Once | INTRAVENOUS | Status: AC
  Administered 2022-11-04: 500 mL via INTRAVENOUS

## 2022-11-04 MED ORDER — SERTRALINE HCL 100 MG PO TABS
100.0000 mg | ORAL_TABLET | Freq: Every day | ORAL | Status: DC
  Administered 2022-11-04 – 2022-11-10 (×7): 100 mg via ORAL
  Filled 2022-11-04 (×7): qty 1

## 2022-11-04 MED ORDER — OXYCODONE-ACETAMINOPHEN 5-325 MG PO TABS
1.0000 | ORAL_TABLET | Freq: Four times a day (QID) | ORAL | Status: DC | PRN
  Administered 2022-11-04 – 2022-11-10 (×17): 1 via ORAL
  Filled 2022-11-04 (×17): qty 1

## 2022-11-04 MED ORDER — BISACODYL 5 MG PO TBEC: 5.0000 mg | DELAYED_RELEASE_TABLET | Freq: Every day | ORAL | Status: DC | PRN

## 2022-11-04 MED ORDER — METRONIDAZOLE 500 MG/100ML IV SOLN: 500.0000 mg | Freq: Two times a day (BID) | INTRAVENOUS | Status: DC

## 2022-11-04 MED ORDER — VANCOMYCIN HCL 1250 MG/250ML IV SOLN: 1250.0000 mg | INTRAVENOUS | Status: DC

## 2022-11-04 MED ORDER — ACETAMINOPHEN 325 MG PO TABS: 650.0000 mg | ORAL_TABLET | Freq: Four times a day (QID) | ORAL | Status: DC | PRN

## 2022-11-04 MED ORDER — POTASSIUM CHLORIDE 20 MEQ PO PACK
40.0000 meq | PACK | Freq: Two times a day (BID) | ORAL | Status: DC
  Filled 2022-11-04: qty 2

## 2022-11-04 MED ORDER — MORPHINE SULFATE (PF) 2 MG/ML IV SOLN
1.0000 mg | Freq: Four times a day (QID) | INTRAVENOUS | Status: DC | PRN
  Administered 2022-11-09: 1 mg via INTRAVENOUS
  Filled 2022-11-04: qty 1

## 2022-11-04 MED ORDER — SODIUM CHLORIDE 0.9 % IV SOLN
2.0000 g | Freq: Once | INTRAVENOUS | Status: AC
  Administered 2022-11-04: 2 g via INTRAVENOUS
  Filled 2022-11-04: qty 12.5

## 2022-11-04 MED ORDER — GABAPENTIN 300 MG PO CAPS
300.0000 mg | ORAL_CAPSULE | Freq: Three times a day (TID) | ORAL | Status: DC
  Administered 2022-11-04 – 2022-11-10 (×19): 300 mg via ORAL
  Filled 2022-11-04 (×19): qty 1

## 2022-11-04 MED ORDER — VANCOMYCIN HCL 1500 MG/300ML IV SOLN: 1500.0000 mg | INTRAVENOUS | Status: DC

## 2022-11-04 MED ORDER — METRONIDAZOLE 500 MG/100ML IV SOLN
500.0000 mg | Freq: Two times a day (BID) | INTRAVENOUS | Status: DC
  Filled 2022-11-04: qty 100

## 2022-11-04 MED ORDER — SODIUM CHLORIDE 0.9 % IV SOLN
2.0000 g | Freq: Two times a day (BID) | INTRAVENOUS | Status: DC
  Administered 2022-11-04: 2 g via INTRAVENOUS
  Filled 2022-11-04: qty 12.5

## 2022-11-04 MED ORDER — CYCLOBENZAPRINE HCL 10 MG PO TABS
10.0000 mg | ORAL_TABLET | Freq: Three times a day (TID) | ORAL | Status: DC | PRN
  Administered 2022-11-04 – 2022-11-07 (×4): 10 mg via ORAL
  Filled 2022-11-04 (×4): qty 1

## 2022-11-04 MED ORDER — VANCOMYCIN HCL 2000 MG/400ML IV SOLN
2000.0000 mg | Freq: Once | INTRAVENOUS | Status: AC
  Administered 2022-11-04: 2000 mg via INTRAVENOUS
  Filled 2022-11-04: qty 400

## 2022-11-04 MED ORDER — LACTATED RINGERS IV SOLN: INTRAVENOUS | Status: DC

## 2022-11-04 MED ORDER — VANCOMYCIN HCL IN DEXTROSE 1-5 GM/200ML-% IV SOLN: 1000.0000 mg | Freq: Once | INTRAVENOUS | Status: DC

## 2022-11-04 MED ORDER — INSULIN ASPART 100 UNIT/ML IJ SOLN
0.0000 [IU] | Freq: Every day | INTRAMUSCULAR | Status: DC
  Administered 2022-11-04 – 2022-11-08 (×2): 2 [IU] via SUBCUTANEOUS

## 2022-11-04 MED ORDER — FERROUS SULFATE 325 (65 FE) MG PO TABS
325.0000 mg | ORAL_TABLET | Freq: Every day | ORAL | Status: DC
  Administered 2022-11-04 – 2022-11-10 (×7): 325 mg via ORAL
  Filled 2022-11-04 (×7): qty 1

## 2022-11-04 MED ORDER — ONDANSETRON HCL 4 MG PO TABS: 4.0000 mg | ORAL_TABLET | Freq: Four times a day (QID) | ORAL | Status: DC | PRN

## 2022-11-04 NOTE — ED Notes (Signed)
Attempted to call floor to give updates on pt, prior to transfer. Was informed pt not yet assigned to nurse and current room assigned is closed. Was informed of new room assignment and that inpatient nurse will be notified.

## 2022-11-04 NOTE — Sepsis Progress Note (Signed)
Elink following code sepsis °

## 2022-11-04 NOTE — H&P (Addendum)
History and Physical   [facility] [place] @ [facility] Admission History and Physical [employer], D.O.    Patient Name: Donna Hopkins MR#: [id number] Date of Birth: Mar 26, 1955 Date of Admission: 11/03/2022  Referring MD/NP/PA: Loleta Dicker Primary Care Physician: Dennard Schaumann, MD  Chief Complaint:  Chief Complaint  Patient presents with   Fall    HPI: Donna Hopkins is a 67 y.o. female with a known history of mechanical aortic valve on Coumadin carotid stenosis, CKD stage III, chronic systolic heart failure, depression, diabetes, GERD, hypertension, AICD, hyperlipidemia, pulmonary hypertension presents to the emergency department for evaluation of fall.  Patient was in a usual state of health until 1 day prior to arrival in the emergency department when she had a mechanical fall tripping down a few steps and hit her head on the wall.  She reports associated headache, blurred vision and vomiting x 1.  She has complaints of subjective fevers and chills as well as palpitations since this morning  Of note she also has a left foot ulcer that is currently being treated by podiatry, taking Augmentin since 8/20  Patient denies weakness, dizziness, chest pain, shortness of breath, N/V/C/D, abdominal pain, dysuria/frequency, changes in mental status.    Otherwise there has been no change in status. Patient has been taking medication as prescribed and there has been no recent change in medication or diet.  No recent antibiotics.  There has been no recent illness, hospitalizations, travel or sick contacts.    EMS/ED Course: Patient received doxepin, Flagyl, Vanco, lactated Ringer's. Medical admission has been requested for further management of sepsis secondary to diabetic foot infection, subdural hematoma.  Neurosurgery was consulted by the emergency department team and did not find an indication for reversal of her Coumadin at this point  Review of Systems:  CONSTITUTIONAL:  Positive headache. no fever/chills, fatigue, weakness, weight gain/loss EYES: Positive blurry vision ENT: No tinnitus, postnasal drip, redness or soreness of the oropharynx. RESPIRATORY: No cough, dyspnea, wheeze.  No hemoptysis.  CARDIOVASCULAR: Positive palpitations no chest pain, syncope, orthopnea. No lower extremity edema.  GASTROINTESTINAL: Positive vomiting x 1.  No nausea, abdominal pain, diarrhea, constipation.  No hematemesis, melena or hematochezia. GENITOURINARY: No dysuria, frequency, hematuria. ENDOCRINE: No polyuria or nocturia. No heat or cold intolerance. HEMATOLOGY: No anemia, bruising, bleeding. INTEGUMENTARY: No rashes, ulcers, lesions. MUSCULOSKELETAL: Positive left foot pain no arthritis, gout. NEUROLOGIC: No numbness, tingling, ataxia, seizure-type activity, weakness. PSYCHIATRIC: No anxiety, depression, insomnia.   Past Medical History:  Diagnosis Date   Carotid stenosis, asymptomatic, bilateral    Chronic kidney disease, stage III (moderate) (HCC)    Chronic low back pain    Clinical systolic heart failure, chronic (HCC)    Depression    Diabetes mellitus    Eosinophilic gastritis    GERD (gastroesophageal reflux disease)    Gout, renal disease    H/O mechanical aortic valve replacement    on coumadin   Hx of aortic valve replacement    Hypertension    Hypocalcemia    Hyponatremia    ICD (implantable cardioverter-defibrillator) in place    Medication refill    Mixed hyperlipidemia    Neuropathy    Obesity    OP (osteoporosis)    Peripheral vascular disease, unspecified (HCC)    Persistent insomnia    Personal history of colonic polyps    Pulmonary hypertension (HCC)    Snoring    declines sleep study   Tricuspid regurgitation    Uncontrolled type 2  diabetes mellitus with complication    Valgus foot    Vertigo, benign positional    Vitamin D deficiency     Past Surgical History:  Procedure Laterality Date   ABDOMINAL HYSTERECTOMY     BACK  SURGERY     BI-VENTRICULAR IMPLANTABLE CARDIOVERTER DEFIBRILLATOR  (CRT-D)  07/08/2012   SJM Quadra Assura BiV ICD implanted by Dr Reed Pandy at [facility]   CARDIAC VALVE REPLACEMENT     LEAD INSERTION N/A 04/16/2018   Successful BiV ICD system revision with a new RV lead and pulse generator replacement with a [facility] Assura MP model [id number] ICD     reports that she has quit smoking. She has never used smokeless tobacco. She reports that she does not drink alcohol and does not use drugs.  Allergies  Allergen Reactions   Canagliflozin Shortness Of Breath   Metformin Diarrhea    Other Reaction(s): Diarrhea   Tetanus Toxoids     Infection at injection site    Family History  Problem Relation Age of Onset   Heart disease Father    Hypertension Father    Anxiety disorder Sister    Diabetes Mellitus II Sister    Hypertension Sister    Colon polyps Sister    Hyperlipidemia Sister    Anxiety disorder Brother    Heart disease Brother    Depression Brother    Hyperlipidemia Brother     Prior to Admission medications   Medication Sig Start Date End Date Taking? Authorizing Provider  acetaminophen (TYLENOL) 650 MG CR tablet Take 1,300 mg by mouth daily as needed for pain.   Yes [provider]  albuterol (PROVENTIL HFA;VENTOLIN HFA) 108 (90 BASE) MCG/ACT inhaler Inhale 2 puffs into the lungs every 6 (six) hours as needed for wheezing or shortness of breath.    Yes [provider]  ALPRAZolam Prudy Feeler) 0.5 MG tablet Take 0.5 mg by mouth 2 (two) times daily as needed for anxiety or sleep.   Yes [provider]  amoxicillin-clavulanate (AUGMENTIN) 875-125 MG tablet Take 1 tablet by mouth 2 (two) times daily. 10/30/22  Yes McDonald, Rachelle Hora, DPM  atorvastatin (LIPITOR) 10 MG tablet Take 10 mg by mouth daily.   Yes [provider]  carvedilol (COREG) 25 MG tablet Take 25 mg by mouth 2 (two) times daily with a meal.   Yes [provider]   cyclobenzaprine (FLEXERIL) 10 MG tablet Take 10 mg by mouth 3 (three) times daily as needed for muscle spasms.   Yes [provider]  ergocalciferol (VITAMIN D2) 1.25 MG (50000 UT) capsule Take 50,000 Units by mouth once a week.   Yes [provider]  ferrous sulfate 325 (65 FE) MG tablet Take 325 mg by mouth daily with breakfast.   Yes [provider]  furosemide (LASIX) 40 MG tablet Take 80 mg by mouth daily. 01/03/22  Yes [provider]  gabapentin (NEURONTIN) 300 MG capsule Take 300 mg by mouth 3 (three) times daily.   Yes [provider]  HUMALOG KWIKPEN 100 UNIT/ML KwikPen Inject 25 Units into the skin in the morning and at bedtime. 09/26/22  Yes [provider]  JARDIANCE 25 MG TABS tablet Take 1 tablet by mouth daily. 05/30/18  Yes [provider]  LEVEMIR FLEXTOUCH 100 UNIT/ML Pen Inject 40 Units into the skin 2 (two) times daily. 01/04/18  Yes [provider]  omeprazole (PRILOSEC) 40 MG capsule Take 80 mg by mouth daily. 01/16/18  Yes [provider]  oxyCODONE-acetaminophen (PERCOCET) 10-325 MG per tablet Take 1 tablet by mouth every 6 (six) hours as needed for pain.   Yes [provider]  potassium chloride SA (K-DUR,KLOR-CON) 20 MEQ tablet Take 20 mEq by mouth daily.   Yes [provider]  ramipril (ALTACE) 10 MG capsule Take 2 capsules by mouth daily.   Yes [provider]  Semaglutide, 2 MG/DOSE, (OZEMPIC, 2 MG/DOSE,) 8 MG/3ML SOPN Inject 2 mg into the skin once a week.   Yes [provider]  sertraline (ZOLOFT) 100 MG tablet Take 100 mg by mouth daily.   Yes [provider]  warfarin (COUMADIN) 7.5 MG tablet Take 7.5 mg by mouth daily at 4 PM. 08/13/22  Yes [provider]  gentamicin cream (GARAMYCIN) 0.1 % Apply 1 Application topically 2 (two) times daily. Patient not taking: Reported on 11/04/2022 10/23/21   Felecia Shelling, DPM  mupirocin ointment  (BACTROBAN) 2 % Apply 1 Application topically 2 (two) times daily. 09/12/22   Felecia Shelling, DPM  triamcinolone cream (KENALOG) 0.1 % Apply 1 application topically 2 (two) times daily as needed (dry skin).     [provider]    Physical Exam: Vitals:   11/04/22 0250 11/04/22 0300 11/04/22 0430 11/04/22 0432  BP: (!) 120/95 133/73 (!) 150/64   Pulse: (!) 106  (!) 102   Resp: 17 18 15    Temp: 98.6 F (37 C)   98.4 F (36.9 C)  TempSrc: Oral   Oral  SpO2: 98%  100%     GENERAL: 67 y.o.-year-old black female patient, well-developed, well-nourished lying in the bed in no acute distress.  Pleasant and cooperative.   HEENT: Head atraumatic, normocephalic. Pupils equal. Mucus membranes moist. Poor dentition NECK: Supple. No JVD. CHEST: Normal breath sounds bilaterally. No wheezing, rales, rhonchi or crackles. No use of accessory muscles of respiration.  No reproducible chest wall tenderness.  CARDIOVASCULAR: S1, S2 normal. No murmurs, rubs, or gallops. Cap refill <2 seconds. Pulses intact distally.  ABDOMEN: Soft, nondistended, nontender. No rebound, guarding, rigidity. Normoactive bowel sounds present in all four quadrants.  EXTREMITIES: left foot ulcer at base of great toe plantar aspect. No pedal edema, cyanosis, or clubbing. No calf tenderness or Homan's sign.  NEUROLOGIC: The patient is alert and oriented x 3. Cranial nerves II through XII are grossly intact with no focal sensorimotor deficit. PSYCHIATRIC:  Normal affect, mood, thought content.     Labs on Admission:  CBC: Recent Labs  Lab 11/03/22 2116  WBC 11.7*  NEUTROABS 8.0*  HGB 11.7*  HCT 36.8  MCV 89.8  PLT 372   Basic Metabolic Panel: Recent Labs  Lab 11/03/22 2116  NA 133*  K 3.2*  CL 94*  CO2 26  GLUCOSE 195*  BUN 10  CREATININE 1.19*  CALCIUM 9.1   GFR: CrCl cannot be calculated (Unknown ideal weight.). Liver Function Tests: Recent Labs  Lab 11/03/22 2116  AST 25  ALT 18  ALKPHOS 103   BILITOT 1.1  PROT 8.4*  ALBUMIN 3.5   No results for input(s): "LIPASE", "AMYLASE" in the last 168 hours. No results for input(s): "AMMONIA" in the last 168 hours. Coagulation Profile: Recent Labs  Lab 11/03/22 2116  INR 4.1*   Cardiac Enzymes: Recent Labs  Lab 11/03/22 2116  CKTOTAL 98   BNP (last 3 results) No results for input(s): "PROBNP" in the last 8760 hours. HbA1C: No results for input(s): "HGBA1C" in the last 72 hours.  CBG: No results for input(s): "GLUCAP" in the last 168 hours. Lipid Profile: No results for input(s): "CHOL", "HDL", "LDLCALC", "TRIG", "CHOLHDL", "LDLDIRECT" in the last 72 hours. Thyroid Function Tests: No results for input(s): "TSH", "T4TOTAL", "FREET4", "T3FREE", "THYROIDAB" in the last 72 hours. Anemia Panel: No results for input(s): "VITAMINB12", "FOLATE", "FERRITIN", "TIBC", "IRON", "RETICCTPCT" in the last 72 hours. Urine analysis:    Component Value Date/Time   COLORURINE YELLOW 11/03/2022 2113   APPEARANCEUR HAZY (A) 11/03/2022 2113   LABSPEC 1.028 11/03/2022 2113   PHURINE 7.0 11/03/2022 2113   GLUCOSEU >=500 (A) 11/03/2022 2113   HGBUR NEGATIVE 11/03/2022 2113   BILIRUBINUR NEGATIVE 11/03/2022 2113   KETONESUR 20 (A) 11/03/2022 2113   PROTEINUR NEGATIVE 11/03/2022 2113   UROBILINOGEN 0.2 09/17/2013 2335   NITRITE NEGATIVE 11/03/2022 2113   LEUKOCYTESUR NEGATIVE 11/03/2022 2113   Sepsis Labs: @LABRCNTIP (procalcitonin:4,lacticidven:4) )No results found for this or any previous visit (from the past 240 hour(s)).   Radiological Exams on Admission: DG Foot 2 Views Left  Result Date: 11/04/2022 CLINICAL DATA:  Recent fall with foot pain, initial encounter EXAM: LEFT FOOT - 2 VIEW COMPARISON:  None Available. FINDINGS: Plantar arch is flattened. Tarsal degenerative changes are seen. Calcaneal spurs are noted. No acute fracture or dislocation is noted. IMPRESSION: Chronic changes without acute abnormality. Electronically Signed   By:  Alcide Clever M.D.   On: 11/04/2022 02:55   DG Femur Min 2 Views Left  Result Date: 11/04/2022 CLINICAL DATA:  Recent fall with left leg pain, initial encounter EXAM: LEFT FEMUR 2 VIEWS COMPARISON:  None Available. FINDINGS: Degenerative changes of the hip joint are noted. No acute fracture or dislocation is noted. No soft tissue changes are seen. IMPRESSION: No acute abnormality noted. Electronically Signed   By: Alcide Clever M.D.   On: 11/04/2022 02:55   DG Wrist Complete Right  Result Date: 11/04/2022 CLINICAL DATA:  Recent fall with wrist pain, initial encounter EXAM: RIGHT WRIST - COMPLETE 3+ VIEW COMPARISON:  None Available. FINDINGS: No acute fracture or dislocation is noted. Mild soft tissue swelling is noted. Mild degenerative changes of the first [facility] joint are noted. 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 IMPRESSION: Mild soft tissue swelling without acute bony abnormality. Electronically Signed   By: Alcide Clever M.D.   On: 11/04/2022 02:53   DG Hip Unilat W or Wo Pelvis 2-3 Views Left  Result Date: 11/04/2022 CLINICAL DATA:  Recent fall on blood thinners with left hip pain, initial encounter EXAM: DG HIP (WITH OR WITHOUT PELVIS) 3V LEFT COMPARISON:  06/10/2022 FINDINGS: Pelvic ring is intact. Degenerative changes of the hip joints are noted bilaterally. Degenerative changes of lumbar spine are noted. No acute fracture or dislocation is seen. No soft tissue changes are noted. Degenerative change without acute abnormality. IMPRESSION: Negative. Electronically Signed   By: Alcide Clever M.D.   On: 11/04/2022 02:51   CT HEAD WO CONTRAST ( )  Result Date: 11/04/2022 CLINICAL DATA:  Fall yesterday with headaches and neck pain, initial encounter EXAM: CT HEAD WITHOUT CONTRAST CT CERVICAL SPINE WITHOUT CONTRAST TECHNIQUE: Multidetector CT imaging of the head and cervical spine was performed following the standard protocol without intravenous contrast. Multiplanar CT image  reconstructions of the cervical spine were also generated. RADIATION DOSE REDUCTION: This exam was performed according to the departmental dose-optimization program which includes automated exposure control, adjustment of the mA and/or kV according to patient size and/or use of iterative reconstruction technique. COMPARISON:  None Available. FINDINGS: CT  HEAD FINDINGS Brain: There are changes consistent with subarachnoid hemorrhage surrounding the cerebral peduncles and pons posteriorly. Diffuse foci of subarachnoid hemorrhage are noted near the vertex on the left related to the localized injury. Small subdural hematoma is noted on the left as well near the vertex. Vascular: No hyperdense vessel or unexpected calcification. Skull: Normal. Negative for fracture or focal lesion. Sinuses/Orbits: No acute finding. Other: Large scalp hematoma is noted on the left consistent with the recent injury. This measures approximately 5.2 x 2.1 cm in greatest AP and transverse dimensions respectively. CT CERVICAL SPINE FINDINGS Alignment: Within normal limits. Skull base and vertebrae: Seven cervical segments are well visualized. Vertebral body height is well maintained. No acute fracture or acute facet abnormality is noted. Multilevel osteophytic change and facet hypertrophic changes noted. The odontoid is within normal limits. Soft tissues and spinal canal: Surrounding soft tissue structures are within normal limits. Upper chest: Lung apices are unremarkable. Other: None IMPRESSION: CT of the head: Changes consistent with the known history of recent injury with a large left scalp hematoma in the parietal region near the vertex. Areas of subarachnoid hemorrhage in the basilar cisterns as well as on the left near the vertex. Small subdural component is noted as well on the left near the vertex. CT of the cervical spine: Multilevel degenerative change without acute abnormality. Critical Value/emergent results were called by  telephone at the time of interpretation on 11/04/2022 at 1:41 am to [facility] , who verbally acknowledged these results. Electronically Signed   By: Alcide Clever M.D.   On: 11/04/2022 01:42   CT Cervical Spine Wo Contrast  Result Date: 11/04/2022 CLINICAL DATA:  Fall yesterday with headaches and neck pain, initial encounter EXAM: CT HEAD WITHOUT CONTRAST CT CERVICAL SPINE WITHOUT CONTRAST TECHNIQUE: Multidetector CT imaging of the head and cervical spine was performed following the standard protocol without intravenous contrast. Multiplanar CT image reconstructions of the cervical spine were also generated. RADIATION DOSE REDUCTION: This exam was performed according to the departmental dose-optimization program which includes automated exposure control, adjustment of the mA and/or kV according to patient size and/or use of iterative reconstruction technique. COMPARISON:  None Available. FINDINGS: CT HEAD FINDINGS Brain: There are changes consistent with subarachnoid hemorrhage surrounding the cerebral peduncles and pons posteriorly. Diffuse foci of subarachnoid hemorrhage are noted near the vertex on the left related to the localized injury. Small subdural hematoma is noted on the left as well near the vertex. Vascular: No hyperdense vessel or unexpected calcification. Skull: Normal. Negative for fracture or focal lesion. Sinuses/Orbits: No acute finding. Other: Large scalp hematoma is noted on the left consistent with the recent injury. This measures approximately 5.2 x 2.1 cm in greatest AP and transverse dimensions respectively. CT CERVICAL SPINE FINDINGS Alignment: Within normal limits. Skull base and vertebrae: Seven cervical segments are well visualized. Vertebral body height is well maintained. No acute fracture or acute facet abnormality is noted. Multilevel osteophytic change and facet hypertrophic changes noted. The odontoid is within normal limits. Soft tissues and spinal canal: Surrounding  soft tissue structures are within normal limits. Upper chest: Lung apices are unremarkable. Other: None IMPRESSION: CT of the head: Changes consistent with the known history of recent injury with a large left scalp hematoma in the parietal region near the vertex. Areas of subarachnoid hemorrhage in the basilar cisterns as well as on the left near the vertex. Small subdural component is noted as well on the left near the vertex. CT of the cervical  spine: Multilevel degenerative change without acute abnormality. Critical Value/emergent results were called by telephone at the time of interpretation on 11/04/2022 at 1:41 am to [facility] , who verbally acknowledged these results. Electronically Signed   By: Alcide Clever M.D.   On: 11/04/2022 01:42   DG Chest 2 View  Result Date: 11/03/2022 CLINICAL DATA:  Suspected sepsis.  Fall. EXAM: CHEST - 2 VIEW COMPARISON:  06/10/2022 FINDINGS: Patient's chin obscures the apices on the frontal view. Prior median sternotomy with prosthetic cardiac valve. Left-sided pacemaker in place. No focal airspace disease, pleural effusion, pulmonary edema or pneumothorax. Thoracic spondylosis. Safety pin projects over the left clavicle. IMPRESSION: No acute chest findings. Electronically Signed   By: Narda Rutherford M.D.   On: 11/03/2022 21:42    EKG: Pending  Assessment/Plan  This is a 67 y.o. female with a history of mechanical aortic valve on Coumadin carotid stenosis, CKD stage III, chronic systolic heart failure, depression, diabetes, GERD, hypertension, AICD, hyperlipidemia, pulmonary hypertension now being admitted with:  #. Sepsis secondary to unknown source, presumed diabetic foot infection - Admit to inpatient with telemetry monitoring - IV antibiotics: Cefepime, metronidazole, vancomycin - IV fluid hydration received 3 L LR will hold for now -Trend lactate - Follow up wound cultures - Repeat CBC in am.  - CT left foot -Please call podiatry in a.m.  #.   Subarachnoid hematoma, small subdural -No indication to reverse Coumadin at this point, will hold for now - Neurochecks - Neurosurgery and cardiology have been consulted  #. Supratherapeutic INR, Mechanical aortic valve on Coumadin,  - Hold coumadin - Daily INR checks  #.  Mild hypokalemia - Replace orally - Check mag level  #. Acute kidney injury  - IV fluids and repeat BMP in AM.  - Avoid nephrotoxic medications  #. History of CHF - Continue Coreg, Lasix, ramipril  #. History of hyperlipidemia - Continue atorvastatin  #. History of anxiety - Continue alprazolam  #. History of diabetes - Continue Lantus -Hold Jardiance and semaglutide - Accu-Cheks ACHS with regular insulin sliding scale - Heart healthy carb controlled diet  #. History of GERD - Continue Protonix  - Please follow up EKG  Admission status: Inpatient, telemetry IV Fluids: Hep-Lock Diet/Nutrition: Heart healthy, carb controlled Consults called: Neurosurgery, cardiology DVT Px: SCDs and early ambulation. Code Status: Full Code  Disposition Plan: To home in 1-2 days  All the records are reviewed and case discussed with ED provider. Management plans discussed with the patient and/or family who express understanding and agree with plan of care.  Meilech Virts D.O. on 11/04/2022 at 5:02 AM CC: Primary care physician; Dennard Schaumann, MD   11/04/2022, 5:02 AM

## 2022-11-04 NOTE — Progress Notes (Signed)
Pharmacy Antibiotic Note  Donna Hopkins is a 66 y.o. female admitted on 11/03/2022 with sepsis, possible diabetic foot wound.  Pharmacy has been consulted for Vancomycin and Cefepime  dosing.  Vancomycin 2 g IV given in ED at 0230    WBC elevated at 11.7, lactate 3.1 > 1.8. Pt had slight AKI upon admission (Scr 1.19), now down to 0.9. Will adjust antibiotic dosing.  8/24 Bcx: ngtd  Plan: Increase vancomycin to 1500 mg IV q24h (eAUC 539) Increase cefepime to 2 g IV q8h  Monitor WBC, temperature, renal function, and cx  F/u LOT  Weight: 100.1 kg (220 lb 10.9 oz)  Temp (24hrs), Avg:98.9 F (37.2 C), Min:98 F (36.7 C), Max:100.4 F (38 C)  Recent Labs  Lab 11/03/22 2116 11/03/22 2128 11/03/22 2301 11/04/22 0246 11/04/22 0428 11/04/22 0654  WBC 11.7*  --   --   --   --   --   CREATININE 1.19*  --   --   --   --  0.90  LATICACIDVEN  --  2.8* 3.1* 2.2* 1.8  --     Estimated Creatinine Clearance: 74.3 mL/min (by C-G formula based on SCr of 0.9 mg/dL).    Allergies  Allergen Reactions   Canagliflozin Shortness Of Breath   Metformin Diarrhea    Other Reaction(s): Diarrhea   Tetanus Toxoids     Infection at injection site     [name] I [name] 11/04/2022 11:27 AM

## 2022-11-04 NOTE — Progress Notes (Signed)
Pt heart rate 140s, asymptomatic. K 3.2, Mg 1.4.  MD notified.  Report given to [facility], Charity fundraiser.

## 2022-11-04 NOTE — ED Notes (Signed)
Received call from lab attempting to report critical lab value. Attempted to inform lab that pt is still in lobby and not yet in room or assigned to provider. Lab continues to report critical lab value regardless. Dr. Lynelle Doctor notified due to pt being assigned to [facility].

## 2022-11-04 NOTE — Progress Notes (Signed)
Pharmacy Antibiotic Note  Donna Hopkins is a 67 y.o. female admitted on 11/03/2022 with sepsis, possible diabetic foot wound.  Pharmacy has been consulted for Vancomycin and Cefepime  dosing.  Vancomycin 2 g IV given in ED at 0230    Plan: Vancomycin 1250 mg IV q24h Cefepime 2 g IV q12h  Weight: 100.1 kg (220 lb 10.9 oz)  Temp (24hrs), Avg:98.8 F (37.1 C), Min:98 F (36.7 C), Max:100.4 F (38 C)  Recent Labs  Lab 11/03/22 2116 11/03/22 2128 11/03/22 2301 11/04/22 0246 11/04/22 0428  WBC 11.7*  --   --   --   --   CREATININE 1.19*  --   --   --   --   LATICACIDVEN  --  2.8* 3.1* 2.2* 1.8    Estimated Creatinine Clearance: 56.2 mL/min (A) (by C-G formula based on SCr of 1.19 mg/dL (H)).    Allergies  Allergen Reactions   Canagliflozin Shortness Of Breath   Metformin Diarrhea    Other Reaction(s): Diarrhea   Tetanus Toxoids     Infection at injection site     Eddie Candle 11/04/2022 6:36 AM

## 2022-11-04 NOTE — Progress Notes (Signed)
  Patient Name: Donna Hopkins Date of Encounter: 11/05/2022   Primary Cardiologist: None Electrophysiologist: Lanier Prude, MD  Interval Summary   The patient is doing well today.  At this time, the patient denies chest pain, shortness of breath, or any new concerns.  Vital Signs    Vitals:   11/04/22 1712 11/04/22 1943 11/05/22 0508 11/05/22 0831  BP: 127/60 115/62 (!) 103/54 135/66  Pulse: 77 82 72 75  Resp: 18 13 15 16   Temp: 98.1 F (36.7 C) 98.3 F (36.8 C) 98.4 F (36.9 C) 98.9 F (37.2 C)  TempSrc: Oral Oral Oral Oral  SpO2: 96% 100% 100% 100%  Weight:   94.7 kg     Intake/Output Summary (Last 24 hours) at 11/05/2022 0951 Last data filed at 11/05/2022 0400 Gross per 24 hour  Intake 1308.67 ml  Output --  Net 1308.67 ml   Filed Weights   11/04/22 0623 11/05/22 0508  Weight: 100.1 kg 94.7 kg    Physical Exam    GEN- The patient is well appearing, alert and oriented x 3 today.   Lungs- Clear to ausculation bilaterally, normal work of breathing Cardiac- Regular rate and rhythm, no murmurs, rubs or gallops GI- soft, NT, ND, + BS Extremities- no clubbing or cyanosis. No edema  Telemetry    NSR 70-80s (personally reviewed)  Hospital Course    Donna Hopkins is a 67 year old woman with a history of mechanical aortic valve replacement on Coumadin, carotid artery stenosis, CKD 3, chronic systolic heart failure, CRT-D in situ, hypertension, hyperlipidemia, pulmonary hypertension.  She is known to me from the outpatient setting.  Her device was implanted by Dr. Johney Frame and is followed by me in clinic.  She presented to the hospital after a fall.  She tells me she tripped on a step at home and struck her head.  She was found to have a subarachnoid hemorrhage and subdural hematoma.  She also has a diabetic foot infection and is receiving IV antibiotics.  Assessment & Plan    Ventricular tachycardia With rates in 130s, overall has tolerated well Likely 2/2 CHF and  structural heart disease Do not suspect led to fall Continue amiodarone IV for now.  Not a candidate for invasive coronary evaluation given subdural/subarachnoid hemorrhage Update echo pending   Chronic systolic heart failure CRT-D in situ Continue Coreg, Lasix, ramipril as tolerated   S/p Mechanical aortic valve On Coumadin   Diabetic foot infection Blood cultures NGTD , given presence of mechanical heart valves and device   Subdural/subarachnoid hemorrhage Management per primary team.   Mentating appropriately today  CT scan this am pending. .  For questions or updates, please contact [facility] Please consult [email or web address] for contact info under Cardiology/STEMI.  Signed, Graciella Freer, PA-C  11/05/2022, 9:51 AM

## 2022-11-04 NOTE — ED Notes (Signed)
ED TO INPATIENT HANDOFF REPORT  ED Nurse Name and Phone #: Juliette Alcide RN [phone]  S Name/Age/Gender Donna Hopkins 67 y.o. female Room/Bed: 024C/024C  Code Status   Code Status: Prior  Home/SNF/Other Home Patient oriented to: self, place, time, and situation Is this baseline? Yes   Triage Complete: Triage complete  Chief Complaint Sepsis due to cellulitis (HCC) [L03.90, A41.9]  Triage Note Pt to ED by POV from home following a fall which occurred yesterday. Pt endorses striking her head on the wall, arrives with a large hematoma to the left side of her hea, denies LOC. Pt also endorses taking coumadin. Pt states she has an infection to her left foot also that she needs antibiotics for. Arrives A+O, VSS.   Allergies Allergies  Allergen Reactions   Canagliflozin Shortness Of Breath   Metformin Diarrhea    Other Reaction(s): Diarrhea   Tetanus Toxoids     Infection at injection site    Level of Care/Admitting Diagnosis ED Disposition     ED Disposition  Admit   Condition  --   Comment  Hospital Area: [facility] [[id number]]  Level of Care: Telemetry Medical [[id number]]  May place patient in observation at [facility] or [facility] if equivalent level of care is available:: No  Covid Evaluation: Asymptomatic - no recent exposure (last 10 days) testing not required  Diagnosis: Sepsis due to cellulitis [facility]) [[id number]]  Admitting Physician: Tonye Royalty [[id number]]  Attending Physician: Tonye Royalty [[id number]]          B Medical/Surgery History Past Medical History:  Diagnosis Date   Carotid stenosis, asymptomatic, bilateral    Chronic kidney disease, stage III (moderate) (HCC)    Chronic low back pain    Clinical systolic heart failure, chronic (HCC)    Depression    Diabetes mellitus    Eosinophilic gastritis    GERD (gastroesophageal reflux disease)    Gout, renal disease    H/O mechanical aortic valve replacement    on coumadin   Hx of  aortic valve replacement    Hypertension    Hypocalcemia    Hyponatremia    ICD (implantable cardioverter-defibrillator) in place    Medication refill    Mixed hyperlipidemia    Neuropathy    Obesity    OP (osteoporosis)    Peripheral vascular disease, unspecified (HCC)    Persistent insomnia    Personal history of colonic polyps    Pulmonary hypertension (HCC)    Snoring    declines sleep study   Tricuspid regurgitation    Uncontrolled type 2 diabetes mellitus with complication    Valgus foot    Vertigo, benign positional    Vitamin D deficiency    Past Surgical History:  Procedure Laterality Date   ABDOMINAL HYSTERECTOMY     BACK SURGERY     BI-VENTRICULAR IMPLANTABLE CARDIOVERTER DEFIBRILLATOR  (CRT-D)  07/08/2012   SJM Quadra Assura BiV ICD implanted by Dr Reed Pandy at [facility]   CARDIAC VALVE REPLACEMENT     LEAD INSERTION N/A 04/16/2018   Successful BiV ICD system revision with a new RV lead and pulse generator replacement with a [facility] [place] model [id number] ICD     A IV Location/Drains/Wounds Patient Lines/Drains/Airways Status     Active Line/Drains/Airways     Name Placement date Placement time Site Days   Peripheral IV 11/03/22 20 G Right Antecubital 11/03/22  2249  Antecubital  1   Peripheral IV 11/04/22 18 G  Left Forearm 11/04/22  0300  Forearm  less than 1            Intake/Output Last 24 hours  Intake/Output Summary (Last 24 hours) at 11/04/2022 0404 Last data filed at 11/04/2022 0303 Gross per 24 hour  Intake 2799 ml  Output --  Net 2799 ml    Labs/Imaging Results for orders placed or performed during the hospital encounter of 11/03/22 (from the past 48 hour(s))  Urinalysis, w/ Reflex to Culture (Infection Suspected) -Urine, Clean Catch     Status: Abnormal   Collection Time: 11/03/22  9:13 PM  Result Value Ref Range   Specimen Source URINE, CATHETERIZED    Color, Urine YELLOW YELLOW   APPearance HAZY (A) CLEAR    Specific Gravity, Urine 1.028 1.005 - 1.030   pH 7.0 5.0 - 8.0   Glucose, UA >=500 (A) NEGATIVE mg/dL   Hgb urine dipstick NEGATIVE NEGATIVE   Bilirubin Urine NEGATIVE NEGATIVE   Ketones, ur 20 (A) NEGATIVE mg/dL   Protein, ur NEGATIVE NEGATIVE mg/dL   Nitrite NEGATIVE NEGATIVE   Leukocytes,Ua NEGATIVE NEGATIVE   RBC / HPF 0-5 0 - 5 RBC/hpf   WBC, UA 0-5 0 - 5 WBC/hpf    Comment:        Reflex urine culture not performed if WBC <=10, OR if Squamous epithelial cells >5. If Squamous epithelial cells >5 suggest recollection.    Bacteria, UA NONE SEEN NONE SEEN   Squamous Epithelial / HPF 0-5 0 - 5 /HPF    Comment: Performed at [facility], [address], [place], [place] [address]  Comprehensive metabolic panel     Status: Abnormal   Collection Time: 11/03/22  9:16 PM  Result Value Ref Range   Sodium 133 (L) 135 - 145 mmol/L   Potassium 3.2 (L) 3.5 - 5.1 mmol/L   Chloride 94 (L) 98 - 111 mmol/L   CO2 26 22 - 32 mmol/L   Glucose, Bld 195 (H) 70 - 99 mg/dL    Comment: Glucose reference range applies only to samples taken after fasting for at least 8 hours.   BUN 10 8 - 23 mg/dL   Creatinine, Ser 6.04 (H) 0.44 - 1.00 mg/dL   Calcium 9.1 8.9 - 54.0 mg/dL   Total Protein 8.4 (H) 6.5 - 8.1 g/dL   Albumin 3.5 3.5 - 5.0 g/dL   AST 25 15 - 41 U/L   ALT 18 0 - 44 U/L   Alkaline Phosphatase 103 38 - 126 U/L   Total Bilirubin 1.1 0.3 - 1.2 mg/dL   GFR, Estimated 49 (L) >60 mL/min    Comment: (NOTE) Calculated using the CKD-EPI Creatinine Equation (2021)    Anion gap 13 5 - 15    Comment: Performed at [facility], [address], [place], [place] [address]  CBC with Differential     Status: Abnormal   Collection Time: 11/03/22  9:16 PM  Result Value Ref Range   WBC 11.7 (H) 4.0 - 10.5 K/uL   RBC 4.10 3.87 - 5.11 MIL/uL   Hemoglobin 11.7 (L) 12.0 - 15.0 g/dL   HCT 14.7 82.9 - 56.2 %   MCV 89.8 80.0 - 100.0 fL   MCH 28.5 26.0 - 34.0 pg   MCHC 31.8 30.0 - 36.0 g/dL    RDW 13.0 86.5 - 78.4 %   Platelets 372 150 - 400 K/uL   nRBC 0.0 0.0 - 0.2 %   Neutrophils Relative % 70 %  Neutro Abs 8.0 (H) 1.7 - 7.7 K/uL   Lymphocytes Relative 22 %   Lymphs Abs 2.6 0.7 - 4.0 K/uL   Monocytes Relative 8 %   Monocytes Absolute 1.0 0.1 - 1.0 K/uL   Eosinophils Relative 0 %   Eosinophils Absolute 0.0 0.0 - 0.5 K/uL   Basophils Relative 0 %   Basophils Absolute 0.0 0.0 - 0.1 K/uL   Immature Granulocytes 0 %   Abs Immature Granulocytes 0.05 0.00 - 0.07 K/uL    Comment: Performed at [facility], [address] [place], [place] [address]  Protime-INR     Status: Abnormal   Collection Time: 11/03/22  9:16 PM  Result Value Ref Range   Prothrombin Time 39.7 (H) 11.4 - 15.2 seconds   INR 4.1 (HH) 0.8 - 1.2    Comment: REPEATED TO VERIFY CRITICAL RESULT CALLED TO, READ BACK BY AND VERIFIED WITH: Bethena Roys, RN AT 2220 ON 11/03/22 BY H. HOWARD. (NOTE) INR goal varies based on device and disease states. Performed at [facility], [address] [place], [place] [address]   CK     Status: None   Collection Time: 11/03/22  9:16 PM  Result Value Ref Range   Total CK 98 38 - 234 U/L    Comment: Performed at [facility], [address], [place], [place] [address]  ABO/Rh     Status: None   Collection Time: 11/03/22  9:16 PM  Result Value Ref Range   ABO/RH(D)      A POS Performed at [facility], [address], [place], [place] [address]   I-Stat Lactic Acid, ED     Status: Abnormal   Collection Time: 11/03/22  9:28 PM  Result Value Ref Range   Lactic Acid, Venous 2.8 (HH) 0.5 - 1.9 mmol/L   Comment NOTIFIED PHYSICIAN   I-Stat Lactic Acid, ED     Status: Abnormal   Collection Time: 11/03/22 11:01 PM  Result Value Ref Range   Lactic Acid, Venous 3.1 (HH) 0.5 - 1.9 mmol/L   Comment NOTIFIED PHYSICIAN   Type and screen [facility]     Status: None   Collection Time: 11/04/22  2:44 AM  Result Value Ref Range    ABO/RH(D) A POS    Antibody Screen NEG    Sample Expiration      11/07/2022,2359 Performed at [facility], [address], [place], [place] [address]   Lactic acid, plasma     Status: Abnormal   Collection Time: 11/04/22  2:46 AM  Result Value Ref Range   Lactic Acid, Venous 2.2 (HH) 0.5 - 1.9 mmol/L    Comment: CRITICAL RESULT CALLED TO, READ BACK BY AND VERIFIED WITH Fenton Malling RN 11/04/22 0327 [place] Performed at [facility], [address], [place], [place] [address]    DG Foot 2 Views Left  Result Date: 11/04/2022 CLINICAL DATA:  Recent fall with foot pain, initial encounter EXAM: LEFT FOOT - 2 VIEW COMPARISON:  None Available. FINDINGS: Plantar arch is flattened. Tarsal degenerative changes are seen. Calcaneal spurs are noted. No acute fracture or dislocation is noted. IMPRESSION: Chronic changes without acute abnormality. Electronically Signed   By: Alcide Clever M.D.   On: 11/04/2022 02:55   DG Femur Min 2 Views Left  Result Date: 11/04/2022 CLINICAL DATA:  Recent fall with left leg pain, initial encounter EXAM: LEFT FEMUR 2 VIEWS COMPARISON:  None  Available. FINDINGS: Degenerative changes of the hip joint are noted. No acute fracture or dislocation is noted. No soft tissue changes are seen. IMPRESSION: No acute abnormality noted. Electronically Signed   By: Alcide Clever M.D.   On: 11/04/2022 02:55   DG Wrist Complete Right  Result Date: 11/04/2022 CLINICAL DATA:  Recent fall with wrist pain, initial encounter EXAM: RIGHT WRIST - COMPLETE 3+ VIEW COMPARISON:  None Available. FINDINGS: No acute fracture or dislocation is noted. Mild soft tissue swelling is noted. Mild degenerative changes of the first [facility] joint are noted. 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 IMPRESSION: Mild soft tissue swelling without acute bony abnormality. Electronically Signed   By: Alcide Clever M.D.   On: 11/04/2022 02:53   DG Hip Unilat W or Wo Pelvis 2-3 Views  Left  Result Date: 11/04/2022 CLINICAL DATA:  Recent fall on blood thinners with left hip pain, initial encounter EXAM: DG HIP (WITH OR WITHOUT PELVIS) 3V LEFT COMPARISON:  06/10/2022 FINDINGS: Pelvic ring is intact. Degenerative changes of the hip joints are noted bilaterally. Degenerative changes of lumbar spine are noted. No acute fracture or dislocation is seen. No soft tissue changes are noted. Degenerative change without acute abnormality. IMPRESSION: Negative. Electronically Signed   By: Alcide Clever M.D.   On: 11/04/2022 02:51   CT HEAD WO CONTRAST ( )  Result Date: 11/04/2022 CLINICAL DATA:  Fall yesterday with headaches and neck pain, initial encounter EXAM: CT HEAD WITHOUT CONTRAST CT CERVICAL SPINE WITHOUT CONTRAST TECHNIQUE: Multidetector CT imaging of the head and cervical spine was performed following the standard protocol without intravenous contrast. Multiplanar CT image reconstructions of the cervical spine were also generated. RADIATION DOSE REDUCTION: This exam was performed according to the departmental dose-optimization program which includes automated exposure control, adjustment of the mA and/or kV according to patient size and/or use of iterative reconstruction technique. COMPARISON:  None Available. FINDINGS: CT HEAD FINDINGS Brain: There are changes consistent with subarachnoid hemorrhage surrounding the cerebral peduncles and pons posteriorly. Diffuse foci of subarachnoid hemorrhage are noted near the vertex on the left related to the localized injury. Small subdural hematoma is noted on the left as well near the vertex. Vascular: No hyperdense vessel or unexpected calcification. Skull: Normal. Negative for fracture or focal lesion. Sinuses/Orbits: No acute finding. Other: Large scalp hematoma is noted on the left consistent with the recent injury. This measures approximately 5.2 x 2.1 cm in greatest AP and transverse dimensions respectively. CT CERVICAL SPINE FINDINGS Alignment:  Within normal limits. Skull base and vertebrae: Seven cervical segments are well visualized. Vertebral body height is well maintained. No acute fracture or acute facet abnormality is noted. Multilevel osteophytic change and facet hypertrophic changes noted. The odontoid is within normal limits. Soft tissues and spinal canal: Surrounding soft tissue structures are within normal limits. Upper chest: Lung apices are unremarkable. Other: None IMPRESSION: CT of the head: Changes consistent with the known history of recent injury with a large left scalp hematoma in the parietal region near the vertex. Areas of subarachnoid hemorrhage in the basilar cisterns as well as on the left near the vertex. Small subdural component is noted as well on the left near the vertex. CT of the cervical spine: Multilevel degenerative change without acute abnormality. Critical Value/emergent results were called by telephone at the time of interpretation on 11/04/2022 at 1:41 am to [facility] , who verbally acknowledged these results. Electronically Signed   By: Alcide Clever M.D.   On: 11/04/2022 01:42  CT Cervical Spine Wo Contrast  Result Date: 11/04/2022 CLINICAL DATA:  Fall yesterday with headaches and neck pain, initial encounter EXAM: CT HEAD WITHOUT CONTRAST CT CERVICAL SPINE WITHOUT CONTRAST TECHNIQUE: Multidetector CT imaging of the head and cervical spine was performed following the standard protocol without intravenous contrast. Multiplanar CT image reconstructions of the cervical spine were also generated. RADIATION DOSE REDUCTION: This exam was performed according to the departmental dose-optimization program which includes automated exposure control, adjustment of the mA and/or kV according to patient size and/or use of iterative reconstruction technique. COMPARISON:  None Available. FINDINGS: CT HEAD FINDINGS Brain: There are changes consistent with subarachnoid hemorrhage surrounding the cerebral peduncles and pons  posteriorly. Diffuse foci of subarachnoid hemorrhage are noted near the vertex on the left related to the localized injury. Small subdural hematoma is noted on the left as well near the vertex. Vascular: No hyperdense vessel or unexpected calcification. Skull: Normal. Negative for fracture or focal lesion. Sinuses/Orbits: No acute finding. Other: Large scalp hematoma is noted on the left consistent with the recent injury. This measures approximately 5.2 x 2.1 cm in greatest AP and transverse dimensions respectively. CT CERVICAL SPINE FINDINGS Alignment: Within normal limits. Skull base and vertebrae: Seven cervical segments are well visualized. Vertebral body height is well maintained. No acute fracture or acute facet abnormality is noted. Multilevel osteophytic change and facet hypertrophic changes noted. The odontoid is within normal limits. Soft tissues and spinal canal: Surrounding soft tissue structures are within normal limits. Upper chest: Lung apices are unremarkable. Other: None IMPRESSION: CT of the head: Changes consistent with the known history of recent injury with a large left scalp hematoma in the parietal region near the vertex. Areas of subarachnoid hemorrhage in the basilar cisterns as well as on the left near the vertex. Small subdural component is noted as well on the left near the vertex. CT of the cervical spine: Multilevel degenerative change without acute abnormality. Critical Value/emergent results were called by telephone at the time of interpretation on 11/04/2022 at 1:41 am to [facility] , who verbally acknowledged these results. Electronically Signed   By: Alcide Clever M.D.   On: 11/04/2022 01:42   DG Chest 2 View  Result Date: 11/03/2022 CLINICAL DATA:  Suspected sepsis.  Fall. EXAM: CHEST - 2 VIEW COMPARISON:  06/10/2022 FINDINGS: Patient's chin obscures the apices on the frontal view. Prior median sternotomy with prosthetic cardiac valve. Left-sided pacemaker in place. No  focal airspace disease, pleural effusion, pulmonary edema or pneumothorax. Thoracic spondylosis. Safety pin projects over the left clavicle. IMPRESSION: No acute chest findings. Electronically Signed   By: Narda Rutherford M.D.   On: 11/03/2022 21:42    Pending Labs Unresulted Labs (From admission, onward)     Start     Ordered   11/04/22 0216  Lactic acid, plasma  (Lactic Acid)  Now then every 2 hours,   R (with STAT occurrences)      11/04/22 0215   11/03/22 2048  Culture, blood (Routine x 2)  BLOOD CULTURE X 2,   R (with STAT occurrences)      11/03/22 2047            Vitals/Pain Today's Vitals   11/04/22 0046 11/04/22 0100 11/04/22 0250 11/04/22 0300  BP: (!) 136/98  (!) 120/95 133/73  Pulse:   (!) 106   Resp:   17 18  Temp:  98.7 F (37.1 C) 98.6 F (37 C)   TempSrc:  Oral Oral  SpO2:   98%   PainSc:        Isolation Precautions No active isolations  Medications Medications  lactated ringers infusion ( Intravenous New Bag/Given 11/04/22 0305)  vancomycin (VANCOREADY) IVPB 2000 mg/400 mL (2,000 mg Intravenous New Bag/Given 11/04/22 0226)  lactated ringers bolus 1,000 mL (0 mLs Intravenous Stopped 11/04/22 0301)    And  lactated ringers bolus 1,000 mL (0 mLs Intravenous Stopped 11/04/22 0302)    And  lactated ringers bolus 500 mL (0 mLs Intravenous Stopped 11/04/22 0303)  ceFEPIme (MAXIPIME) 2 g in sodium chloride 0.9 % 100 mL IVPB (0 g Intravenous Stopped 11/04/22 0109)  metroNIDAZOLE (FLAGYL) IVPB 500 mg (0 mg Intravenous Stopped 11/04/22 0221)    Mobility walks     Focused Assessments Cardiac Assessment Handoff:    Lab Results  Component Value Date   CKTOTAL 98 11/03/2022   CKMB 3.1 02/04/2011   TROPONINI <0.30 02/04/2011   No results found for: "DDIMER" Does the Patient currently have chest pain? No   , Neuro Assessment Handoff:  Swallow screen pass? Yes          Neuro Assessment: Within Defined Limits Neuro Checks:      Has TPA been given?  No If patient is a Neuro Trauma and patient is going to OR before floor call report to 4N Charge nurse: [phone] or [phone]   R Recommendations: See Admitting Provider Note  Report given to:   Additional Notes: Pt A&Ox4. Pt reports ambulatory at home but has not ambulated in ED. Pt incontinent and states she uses depends at home.

## 2022-11-04 NOTE — Consult Note (Signed)
Reason for Consult: Left foot infection Referring Physician: Dr. Raenette Rover Donna Hopkins is an 67 y.o. female.  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  I saw her in the office last week and she was developing cellulitis from the wound, started on antibiotics and feels like that is improving.  Had mechanical fall downstairs Friday, says she recalls the fall prior to it happening and did not lose consciousness prior to the fall.  She has been taking antibiotics as directed she says.  Past Medical History:  Diagnosis Date   Carotid stenosis, asymptomatic, bilateral    Chronic kidney disease, stage III (moderate) (HCC)    Chronic low back pain    Clinical systolic heart failure, chronic (HCC)    Depression    Diabetes mellitus    Eosinophilic gastritis    GERD (gastroesophageal reflux disease)    Gout, renal disease    H/O mechanical aortic valve replacement    on coumadin   Hx of aortic valve replacement    Hypertension    Hypocalcemia    Hyponatremia    ICD (implantable cardioverter-defibrillator) in place    Medication refill    Mixed hyperlipidemia    Neuropathy    Obesity    OP (osteoporosis)    Peripheral vascular disease, unspecified (HCC)    Persistent insomnia    Personal history of colonic polyps    Pulmonary hypertension (HCC)    Snoring    declines sleep study   Tricuspid regurgitation    Uncontrolled type 2 diabetes mellitus with complication    Valgus foot    Vertigo, benign positional    Vitamin D deficiency     Past Surgical History:  Procedure Laterality Date   ABDOMINAL HYSTERECTOMY     BACK SURGERY     BI-VENTRICULAR IMPLANTABLE CARDIOVERTER DEFIBRILLATOR  (CRT-D)  07/08/2012   SJM Quadra Assura BiV ICD implanted by Dr Reed Pandy at [facility]   CARDIAC VALVE REPLACEMENT     LEAD INSERTION N/A 04/16/2018   Successful BiV ICD system revision with a new RV lead and pulse  generator replacement with a [employer] Assura MP model [phone] ICD    Family History  Problem Relation Age of Onset   Heart disease Father    Hypertension Father    Anxiety disorder Sister    Diabetes Mellitus II Sister    Hypertension Sister    Colon polyps Sister    Hyperlipidemia Sister    Anxiety disorder Brother    Heart disease Brother    Depression Brother    Hyperlipidemia Brother     Social History:  reports that she has quit smoking. She has never used smokeless tobacco. She reports that she does not drink alcohol and does not use drugs.  Allergies:  Allergies  Allergen Reactions   Canagliflozin Shortness Of Breath   Metformin Diarrhea    Other Reaction(s): Diarrhea   Tetanus Toxoids     Infection at injection site    Medications: I have reviewed the patient's current medications.  Results for orders placed or performed during the hospital encounter of 11/03/22 (from the past 48 hour(s))  Culture, blood (Routine x 2)     Status: None (Preliminary result)   Collection Time: 11/03/22  9:12 PM   Specimen: BLOOD  Result Value Ref Range   Specimen Description BLOOD BLOOD RIGHT ARM    Special Requests  BOTTLES DRAWN AEROBIC ONLY Blood Culture adequate volume   Culture      NO GROWTH < 12 HOURS Performed at [facility], [address], [place], [place] [address]    Report Status PENDING   Urinalysis, w/ Reflex to Culture (Infection Suspected) -Urine, Clean Catch     Status: Abnormal   Collection Time: 11/03/22  9:13 PM  Result Value Ref Range   Specimen Source URINE, CATHETERIZED    Color, Urine YELLOW YELLOW   APPearance HAZY (A) CLEAR   Specific Gravity, Urine 1.028 1.005 - 1.030   pH 7.0 5.0 - 8.0   Glucose, UA >=500 (A) NEGATIVE mg/dL   Hgb urine dipstick NEGATIVE NEGATIVE   Bilirubin Urine NEGATIVE NEGATIVE   Ketones, ur 20 (A) NEGATIVE mg/dL   Protein, ur NEGATIVE NEGATIVE mg/dL   Nitrite NEGATIVE NEGATIVE   Leukocytes,Ua  NEGATIVE NEGATIVE   RBC / HPF 0-5 0 - 5 RBC/hpf   WBC, UA 0-5 0 - 5 WBC/hpf    Comment:        Reflex urine culture not performed if WBC <=10, OR if Squamous epithelial cells >5. If Squamous epithelial cells >5 suggest recollection.    Bacteria, UA NONE SEEN NONE SEEN   Squamous Epithelial / HPF 0-5 0 - 5 /HPF    Comment: Performed at [facility], [address], [place], [place] [address]  Comprehensive metabolic panel     Status: Abnormal   Collection Time: 11/03/22  9:16 PM  Result Value Ref Range   Sodium 133 (L) 135 - 145 mmol/L   Potassium 3.2 (L) 3.5 - 5.1 mmol/L   Chloride 94 (L) 98 - 111 mmol/L   CO2 26 22 - 32 mmol/L   Glucose, Bld 195 (H) 70 - 99 mg/dL    Comment: Glucose reference range applies only to samples taken after fasting for at least 8 hours.   BUN 10 8 - 23 mg/dL   Creatinine, Ser 7.82 (H) 0.44 - 1.00 mg/dL   Calcium 9.1 8.9 - 95.6 mg/dL   Total Protein 8.4 (H) 6.5 - 8.1 g/dL   Albumin 3.5 3.5 - 5.0 g/dL   AST 25 15 - 41 U/L   ALT 18 0 - 44 U/L   Alkaline Phosphatase 103 38 - 126 U/L   Total Bilirubin 1.1 0.3 - 1.2 mg/dL   GFR, Estimated 49 (L) >60 mL/min    Comment: (NOTE) Calculated using the CKD-EPI Creatinine Equation (2021)    Anion gap 13 5 - 15    Comment: Performed at [facility], [address], [place], [place] [address]  CBC with Differential     Status: Abnormal   Collection Time: 11/03/22  9:16 PM  Result Value Ref Range   WBC 11.7 (H) 4.0 - 10.5 K/uL   RBC 4.10 3.87 - 5.11 MIL/uL   Hemoglobin 11.7 (L) 12.0 - 15.0 g/dL   HCT 65.7 84.6 - 96.2 %   MCV 89.8 80.0 - 100.0 fL   MCH 28.5 26.0 - 34.0 pg   MCHC 31.8 30.0 - 36.0 g/dL   RDW 95.2 84.1 - 32.4 %   Platelets 372 150 - 400 K/uL   nRBC 0.0 0.0 - 0.2 %   Neutrophils Relative % 70 %   Neutro Abs 8.0 (H) 1.7 - 7.7 K/uL   Lymphocytes Relative 22 %   Lymphs Abs 2.6 0.7 - 4.0 K/uL   Monocytes Relative 8 %   Monocytes Absolute 1.0  0.1 - 1.0 K/uL   Eosinophils Relative 0  %   Eosinophils Absolute 0.0 0.0 - 0.5 K/uL   Basophils Relative 0 %   Basophils Absolute 0.0 0.0 - 0.1 K/uL   Immature Granulocytes 0 %   Abs Immature Granulocytes 0.05 0.00 - 0.07 K/uL    Comment: Performed at [facility], [address] [place], [place] [address]  Protime-INR     Status: Abnormal   Collection Time: 11/03/22  9:16 PM  Result Value Ref Range   Prothrombin Time 39.7 (H) 11.4 - 15.2 seconds   INR 4.1 (HH) 0.8 - 1.2    Comment: REPEATED TO VERIFY CRITICAL RESULT CALLED TO, READ BACK BY AND VERIFIED WITH: Bethena Roys, RN AT 2220 ON 11/03/22 BY H. HOWARD. (NOTE) INR goal varies based on device and disease states. Performed at [facility], [address] [place], [place] [address]   Culture, blood (Routine x 2)     Status: None (Preliminary result)   Collection Time: 11/03/22  9:16 PM   Specimen: BLOOD  Result Value Ref Range   Specimen Description BLOOD BLOOD LEFT ARM    Special Requests      BOTTLES DRAWN AEROBIC AND ANAEROBIC Blood Culture adequate volume   Culture      NO GROWTH < 12 HOURS Performed at [facility], [address] [place], [place] [address]    Report Status PENDING   CK     Status: None   Collection Time: 11/03/22  9:16 PM  Result Value Ref Range   Total CK 98 38 - 234 U/L    Comment: Performed at [facility], [address], [place], [place] [address]  ABO/Rh     Status: None   Collection Time: 11/03/22  9:16 PM  Result Value Ref Range   ABO/RH(D)      A POS Performed at [facility], [address] [place], [place] [address]   I-Stat Lactic Acid, ED     Status: Abnormal   Collection Time: 11/03/22  9:28 PM  Result Value Ref Range   Lactic Acid, Venous 2.8 (HH) 0.5 - 1.9 mmol/L   Comment NOTIFIED PHYSICIAN   I-Stat Lactic Acid, ED     Status: Abnormal   Collection Time: 11/03/22 11:01 PM  Result Value Ref Range   Lactic Acid, Venous 3.1 (HH) 0.5 - 1.9 mmol/L   Comment NOTIFIED PHYSICIAN    Type and screen [facility]     Status: None   Collection Time: 11/04/22  2:44 AM  Result Value Ref Range   ABO/RH(D) A POS    Antibody Screen NEG    Sample Expiration      11/07/2022,2359 Performed at [facility], [address] [place], [place] [address]   Lactic acid, plasma     Status: Abnormal   Collection Time: 11/04/22  2:46 AM  Result Value Ref Range   Lactic Acid, Venous 2.2 (HH) 0.5 - 1.9 mmol/L    Comment: CRITICAL RESULT CALLED TO, READ BACK BY AND VERIFIED WITH Fenton Malling RN 11/04/22 0327 [place] Performed at [facility], [address], [place], [place] [address]   Lactic acid, plasma     Status: None   Collection Time: 11/04/22  4:28 AM  Result Value Ref Range   Lactic Acid, Venous 1.8 0.5 - 1.9 mmol/L    Comment: Performed at [facility], [address], [place], [place] [address]  Glucose, capillary     Status: Abnormal   Collection Time: 11/04/22  6:26 AM  Result Value Ref Range   Glucose-Capillary 143 (H) 70 - 99 mg/dL    Comment: Glucose reference range applies only to samples taken after fasting for at least 8 hours.  Magnesium     Status: Abnormal   Collection Time: 11/04/22  6:54 AM  Result Value Ref Range   Magnesium 1.4 (L) 1.7 - 2.4 mg/dL    Comment: Performed at [facility], [address] [place], [place] [address]  HIV Antibody (routine testing w rflx)     Status: None   Collection Time: 11/04/22  6:54 AM  Result Value Ref Range   HIV Screen 4th Generation wRfx Non Reactive Non Reactive    Comment: Performed at [facility], [address], [place], [place] [address]  Hemoglobin A1c     Status: Abnormal   Collection Time: 11/04/22  6:54 AM  Result Value Ref Range   Hgb A1c MFr Bld 7.8 (H) 4.8 - 5.6 %    Comment: (NOTE) Pre diabetes:          5.7%-6.4%  Diabetes:              >6.4%  Glycemic control for   <7.0% adults with diabetes    Mean Plasma Glucose 177.16 mg/dL     Comment: Performed at [facility], [address] [place], [place] [address]  Basic metabolic panel     Status: Abnormal   Collection Time: 11/04/22  6:54 AM  Result Value Ref Range   Sodium 132 (L) 135 - 145 mmol/L   Potassium 3.2 (L) 3.5 - 5.1 mmol/L   Chloride 96 (L) 98 - 111 mmol/L   CO2 23 22 - 32 mmol/L   Glucose, Bld 156 (H) 70 - 99 mg/dL    Comment: Glucose reference range applies only to samples taken after fasting for at least 8 hours.   BUN 11 8 - 23 mg/dL   Creatinine, Ser 2.95 0.44 - 1.00 mg/dL   Calcium 8.2 (L) 8.9 - 10.3 mg/dL   GFR, Estimated >62 >13 mL/min    Comment: (NOTE) Calculated using the CKD-EPI Creatinine Equation (2021)    Anion gap 13 5 - 15    Comment: Performed at [facility], [address] [place], [place] [address]  Glucose, capillary     Status: Abnormal   Collection Time: 11/04/22  8:42 AM  Result Value Ref Range   Glucose-Capillary 167 (H) 70 - 99 mg/dL    Comment: Glucose reference range applies only to samples taken after fasting for at least 8 hours.  TSH     Status: None   Collection Time: 11/04/22  9:42 AM  Result Value Ref Range   TSH 1.534 0.350 - 4.500 uIU/mL    Comment: Performed by a 3rd Generation assay with a functional sensitivity of <=0.01 uIU/mL. Performed at [facility], [address] [place], [place] [address]   Hepatic function panel     Status: Abnormal   Collection Time: 11/04/22  9:42 AM  Result Value Ref Range   Total Protein 6.5 6.5 - 8.1 g/dL   Albumin 2.6 (L) 3.5 - 5.0 g/dL   AST 17 15 - 41 U/L   ALT 13 0 - 44 U/L   Alkaline Phosphatase 78 38 - 126 U/L   Total Bilirubin 0.9 0.3 -  1.2 mg/dL   Bilirubin, Direct 0.1 0.0 - 0.2 mg/dL   Indirect Bilirubin 0.8 0.3 - 0.9 mg/dL    Comment: Performed at [facility], [address] [place], [place] [address]    DG Foot 2 Views Left  Result Date: 11/04/2022 CLINICAL DATA:  Recent fall with foot pain, initial encounter EXAM: LEFT FOOT - 2  VIEW COMPARISON:  None Available. FINDINGS: Plantar arch is flattened. Tarsal degenerative changes are seen. Calcaneal spurs are noted. No acute fracture or dislocation is noted. IMPRESSION: Chronic changes without acute abnormality. Electronically Signed   By: Alcide Clever M.D.   On: 11/04/2022 02:55   DG Femur Min 2 Views Left  Result Date: 11/04/2022 CLINICAL DATA:  Recent fall with left leg pain, initial encounter EXAM: LEFT FEMUR 2 VIEWS COMPARISON:  None Available. FINDINGS: Degenerative changes of the hip joint are noted. No acute fracture or dislocation is noted. No soft tissue changes are seen. IMPRESSION: No acute abnormality noted. Electronically Signed   By: Alcide Clever M.D.   On: 11/04/2022 02:55   DG Wrist Complete Right  Result Date: 11/04/2022 CLINICAL DATA:  Recent fall with wrist pain, initial encounter EXAM: RIGHT WRIST - COMPLETE 3+ VIEW COMPARISON:  None Available. FINDINGS: No acute fracture or dislocation is noted. Mild soft tissue swelling is noted. Mild degenerative changes of the first [facility] joint are noted. 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 IMPRESSION: Mild soft tissue swelling without acute bony abnormality. Electronically Signed   By: Alcide Clever M.D.   On: 11/04/2022 02:53   DG Hip Unilat W or Wo Pelvis 2-3 Views Left  Result Date: 11/04/2022 CLINICAL DATA:  Recent fall on blood thinners with left hip pain, initial encounter EXAM: DG HIP (WITH OR WITHOUT PELVIS) 3V LEFT COMPARISON:  06/10/2022 FINDINGS: Pelvic ring is intact. Degenerative changes of the hip joints are noted bilaterally. Degenerative changes of lumbar spine are noted. No acute fracture or dislocation is seen. No soft tissue changes are noted. Degenerative change without acute abnormality. IMPRESSION: Negative. Electronically Signed   By: Alcide Clever M.D.   On: 11/04/2022 02:51   CT HEAD WO CONTRAST ( )  Result Date: 11/04/2022 CLINICAL DATA:  Fall yesterday with headaches and neck  pain, initial encounter EXAM: CT HEAD WITHOUT CONTRAST CT CERVICAL SPINE WITHOUT CONTRAST TECHNIQUE: Multidetector CT imaging of the head and cervical spine was performed following the standard protocol without intravenous contrast. Multiplanar CT image reconstructions of the cervical spine were also generated. RADIATION DOSE REDUCTION: This exam was performed according to the departmental dose-optimization program which includes automated exposure control, adjustment of the mA and/or kV according to patient size and/or use of iterative reconstruction technique. COMPARISON:  None Available. FINDINGS: CT HEAD FINDINGS Brain: There are changes consistent with subarachnoid hemorrhage surrounding the cerebral peduncles and pons posteriorly. Diffuse foci of subarachnoid hemorrhage are noted near the vertex on the left related to the localized injury. Small subdural hematoma is noted on the left as well near the vertex. Vascular: No hyperdense vessel or unexpected calcification. Skull: Normal. Negative for fracture or focal lesion. Sinuses/Orbits: No acute finding. Other: Large scalp hematoma is noted on the left consistent with the recent injury. This measures approximately 5.2 x 2.1 cm in greatest AP and transverse dimensions respectively. CT CERVICAL SPINE FINDINGS Alignment: Within normal limits. Skull base and vertebrae: Seven cervical segments are well visualized. Vertebral body height is well maintained. No acute fracture or acute facet abnormality is noted. Multilevel osteophytic  change and facet hypertrophic changes noted. The odontoid is within normal limits. Soft tissues and spinal canal: Surrounding soft tissue structures are within normal limits. Upper chest: Lung apices are unremarkable. Other: None IMPRESSION: CT of the head: Changes consistent with the known history of recent injury with a large left scalp hematoma in the parietal region near the vertex. Areas of subarachnoid hemorrhage in the basilar  cisterns as well as on the left near the vertex. Small subdural component is noted as well on the left near the vertex. CT of the cervical spine: Multilevel degenerative change without acute abnormality. Critical Value/emergent results were called by telephone at the time of interpretation on 11/04/2022 at 1:41 am to [facility] , who verbally acknowledged these results. Electronically Signed   By: Alcide Clever M.D.   On: 11/04/2022 01:42   CT Cervical Spine Wo Contrast  Result Date: 11/04/2022 CLINICAL DATA:  Fall yesterday with headaches and neck pain, initial encounter EXAM: CT HEAD WITHOUT CONTRAST CT CERVICAL SPINE WITHOUT CONTRAST TECHNIQUE: Multidetector CT imaging of the head and cervical spine was performed following the standard protocol without intravenous contrast. Multiplanar CT image reconstructions of the cervical spine were also generated. RADIATION DOSE REDUCTION: This exam was performed according to the departmental dose-optimization program which includes automated exposure control, adjustment of the mA and/or kV according to patient size and/or use of iterative reconstruction technique. COMPARISON:  None Available. FINDINGS: CT HEAD FINDINGS Brain: There are changes consistent with subarachnoid hemorrhage surrounding the cerebral peduncles and pons posteriorly. Diffuse foci of subarachnoid hemorrhage are noted near the vertex on the left related to the localized injury. Small subdural hematoma is noted on the left as well near the vertex. Vascular: No hyperdense vessel or unexpected calcification. Skull: Normal. Negative for fracture or focal lesion. Sinuses/Orbits: No acute finding. Other: Large scalp hematoma is noted on the left consistent with the recent injury. This measures approximately 5.2 x 2.1 cm in greatest AP and transverse dimensions respectively. CT CERVICAL SPINE FINDINGS Alignment: Within normal limits. Skull base and vertebrae: Seven cervical segments are well  visualized. Vertebral body height is well maintained. No acute fracture or acute facet abnormality is noted. Multilevel osteophytic change and facet hypertrophic changes noted. The odontoid is within normal limits. Soft tissues and spinal canal: Surrounding soft tissue structures are within normal limits. Upper chest: Lung apices are unremarkable. Other: None IMPRESSION: CT of the head: Changes consistent with the known history of recent injury with a large left scalp hematoma in the parietal region near the vertex. Areas of subarachnoid hemorrhage in the basilar cisterns as well as on the left near the vertex. Small subdural component is noted as well on the left near the vertex. CT of the cervical spine: Multilevel degenerative change without acute abnormality. Critical Value/emergent results were called by telephone at the time of interpretation on 11/04/2022 at 1:41 am to [facility] , who verbally acknowledged these results. Electronically Signed   By: Alcide Clever M.D.   On: 11/04/2022 01:42   DG Chest 2 View  Result Date: 11/03/2022 CLINICAL DATA:  Suspected sepsis.  Fall. EXAM: CHEST - 2 VIEW COMPARISON:  06/10/2022 FINDINGS: Patient's chin obscures the apices on the frontal view. Prior median sternotomy with prosthetic cardiac valve. Left-sided pacemaker in place. No focal airspace disease, pleural effusion, pulmonary edema or pneumothorax. Thoracic spondylosis. Safety pin projects over the left clavicle. IMPRESSION: No acute chest findings. Electronically Signed   By: Narda Rutherford M.D.   On: 11/03/2022  21:42    Review of Systems  Constitutional:  Negative for chills, fever and malaise/fatigue.  Respiratory:  Negative for shortness of breath.   Cardiovascular:  Negative for chest pain.  Gastrointestinal:  Negative for nausea and vomiting.  Musculoskeletal:  Positive for falls.   Blood pressure 125/61, pulse (!) 133, temperature 99.4 F (37.4 C), temperature source Oral, resp. rate  16, weight 100.1 kg, SpO2 100%.  Vitals:   11/04/22 0838 11/04/22 0916  BP: (!) 118/91 125/61  Pulse: (!) 133   Resp: 16   Temp: 99.4 F (37.4 C)   SpO2: 100%     General AA&O x3. Normal mood and affect.  Vascular Dorsalis pedis and posterior tibial pulses  present 1+ left  Capillary refill normal to all digits. Pedal hair growth diminished.  Neurologic Epicritic sensation grossly absent.  Dermatologic (Wound) Full-thickness ulcer submetatarsal 1 on the left foot with surrounding hyperkeratosis, fibrogranular wound bed.  No purulent drainage.  Cellulitis has significantly improved since last week  Orthopedic: Motor intact BLE.      Assessment/Plan:  Left foot ulcer and cellulitis -Imaging: Studies independently reviewed.  Would recommend further imaging of the foot to evaluate for osteomyelitis.  She does have an ICD that appears to be MR-compatible would prefer an MRI of the left foot over CT as this will be much more sensitive.  Three-phase bone scan also acceptable.  If neither is an option then CT is acceptable -Antibiotics: May keep her on broad-spectrum antibiotics for now, on cefepime and vancomycin -WB Status: WBAT -Wound Care: Dress daily with Mepilex -Surgical Plan: Pending above imaging studies  Edwin Cap 11/04/2022, 11:35 AM   Best available via secure chat for questions or concerns.

## 2022-11-04 NOTE — Consult Note (Signed)
Reason for Consult: Closed head injury Referring Physician: Medicine  Donna Hopkins is an 67 y.o. female.   HPI:  67 year old female with multiple medical problems including a mechanical aortic valve on Coumadin who fell 2 days ago down some steps and struck her head.  No loss of consciousness.  She complains of a mild headache.  No numbness tingling weakness or visual changes.  Head CT showed traumatic subarachnoid blood in the basal cisterns and a tiny left subdural hematoma without mass effect or shift.  Her Coumadin has been held.  Was not reversed.  She also has infection in her foot that has been treated with her primary care physician for about 3 months per her report.  Past Medical History:  Diagnosis Date   Carotid stenosis, asymptomatic, bilateral    Chronic kidney disease, stage III (moderate) (HCC)    Chronic low back pain    Clinical systolic heart failure, chronic (HCC)    Depression    Diabetes mellitus    Eosinophilic gastritis    GERD (gastroesophageal reflux disease)    Gout, renal disease    H/O mechanical aortic valve replacement    on coumadin   Hx of aortic valve replacement    Hypertension    Hypocalcemia    Hyponatremia    ICD (implantable cardioverter-defibrillator) in place    Medication refill    Mixed hyperlipidemia    Neuropathy    Obesity    OP (osteoporosis)    Peripheral vascular disease, unspecified (HCC)    Persistent insomnia    Personal history of colonic polyps    Pulmonary hypertension (HCC)    Snoring    declines sleep study   Tricuspid regurgitation    Uncontrolled type 2 diabetes mellitus with complication    Valgus foot    Vertigo, benign positional    Vitamin D deficiency     Past Surgical History:  Procedure Laterality Date   ABDOMINAL HYSTERECTOMY     BACK SURGERY     BI-VENTRICULAR IMPLANTABLE CARDIOVERTER DEFIBRILLATOR  (CRT-D)  07/08/2012   SJM Quadra Assura BiV ICD implanted by Dr Reed Pandy at [facility]   CARDIAC VALVE  REPLACEMENT     LEAD INSERTION N/A 04/16/2018   Successful BiV ICD system revision with a new RV lead and pulse generator replacement with a [employer] Assura MP model [phone] ICD    Allergies  Allergen Reactions   Canagliflozin Shortness Of Breath   Metformin Diarrhea    Other Reaction(s): Diarrhea   Tetanus Toxoids     Infection at injection site    Social History   Tobacco Use   Smoking status: Former   Smokeless tobacco: Never  Substance Use Topics   Alcohol use: No    Family History  Problem Relation Age of Onset   Heart disease Father    Hypertension Father    Anxiety disorder Sister    Diabetes Mellitus II Sister    Hypertension Sister    Colon polyps Sister    Hyperlipidemia Sister    Anxiety disorder Brother    Heart disease Brother    Depression Brother    Hyperlipidemia Brother      Review of Systems  Positive ROS: Foot infection  All other systems have been reviewed and were otherwise negative with the exception of those mentioned in the HPI and as above.  Objective: Vital signs in last 24 hours: Temp:  [98 F (36.7 C)-100.4 F (38 C)] 99.4 F (37.4 C) (08/25  1610) Pulse Rate:  [101-133] 133 (08/25 0838) Resp:  [13-20] 16 (08/25 0838) BP: (118-157)/(61-98) 118/91 (08/25 0838) SpO2:  [97 %-100 %] 100 % (08/25 0838) Weight:  [100.1 kg] 100.1 kg (08/25 0623)  General Appearance: Alert, cooperative, no distress, appears stated age Head: Normocephalic, left cephalohematoma Eyes: PERRL, conjunctiva/corneas clear, EOM's intact      Throat: benign Neck: Supple, symmetrical, trachea midline Lungs: respirations unlabored Heart: Regular rate and rhythm Abdomen: Soft   NEUROLOGIC:   Mental status: A&O x4, no aphasia, good attention span, Memory and fund of knowledge appear to be appropriate Motor Exam - grossly normal, normal tone and bulk Sensory Exam - grossly normal Reflexes: symmetric, no pathologic reflexes, No Hoffman's, No  clonus Coordination - grossly normal Gait -not tested Balance -not tested Cranial Nerves: I: smell Not tested  II: visual acuity  OS: na    OD: na  II: visual fields Full to confrontation  II: pupils Equal, round, reactive to light  III,VII: ptosis None  III,IV,VI: extraocular muscles  Full ROM  V: mastication Normal  V: facial light touch sensation  Normal  V,VII: corneal reflex  Present  VII: facial muscle function - upper  Normal  VII: facial muscle function - lower Normal  VIII: hearing Not tested  IX: soft palate elevation  Normal  IX,X: gag reflex Present  XI: trapezius strength  5/5  XI: sternocleidomastoid strength 5/5  XI: neck flexion strength  5/5  XII: tongue strength  Normal    Data Review Lab Results  Component Value Date   WBC 11.7 (H) 11/03/2022   HGB 11.7 (L) 11/03/2022   HCT 36.8 11/03/2022   MCV 89.8 11/03/2022   PLT 372 11/03/2022   Lab Results  Component Value Date   NA 132 (L) 11/04/2022   K 3.2 (L) 11/04/2022   CL 96 (L) 11/04/2022   CO2 23 11/04/2022   BUN 11 11/04/2022   CREATININE 0.90 11/04/2022   GLUCOSE 156 (H) 11/04/2022   Lab Results  Component Value Date   INR 4.1 (HH) 11/03/2022    Radiology: DG Foot 2 Views Left  Result Date: 11/04/2022 CLINICAL DATA:  Recent fall with foot pain, initial encounter EXAM: LEFT FOOT - 2 VIEW COMPARISON:  None Available. FINDINGS: Plantar arch is flattened. Tarsal degenerative changes are seen. Calcaneal spurs are noted. No acute fracture or dislocation is noted. IMPRESSION: Chronic changes without acute abnormality. Electronically Signed   By: Alcide Clever M.D.   On: 11/04/2022 02:55   DG Femur Min 2 Views Left  Result Date: 11/04/2022 CLINICAL DATA:  Recent fall with left leg pain, initial encounter EXAM: LEFT FEMUR 2 VIEWS COMPARISON:  None Available. FINDINGS: Degenerative changes of the hip joint are noted. No acute fracture or dislocation is noted. No soft tissue changes are seen. IMPRESSION:  No acute abnormality noted. Electronically Signed   By: Alcide Clever M.D.   On: 11/04/2022 02:55   DG Wrist Complete Right  Result Date: 11/04/2022 CLINICAL DATA:  Recent fall with wrist pain, initial encounter EXAM: RIGHT WRIST - COMPLETE 3+ VIEW COMPARISON:  None Available. FINDINGS: No acute fracture or dislocation is noted. Mild soft tissue swelling is noted. Mild degenerative changes of the first [facility] joint are noted. 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 IMPRESSION: Mild soft tissue swelling without acute bony abnormality. Electronically Signed   By: Alcide Clever M.D.   On: 11/04/2022 02:53   DG Hip Unilat W or Wo Pelvis 2-3 Views Left  Result Date: 11/04/2022 CLINICAL DATA:  Recent fall on blood thinners with left hip pain, initial encounter EXAM: DG HIP (WITH OR WITHOUT PELVIS) 3V LEFT COMPARISON:  06/10/2022 FINDINGS: Pelvic ring is intact. Degenerative changes of the hip joints are noted bilaterally. Degenerative changes of lumbar spine are noted. No acute fracture or dislocation is seen. No soft tissue changes are noted. Degenerative change without acute abnormality. IMPRESSION: Negative. Electronically Signed   By: Alcide Clever M.D.   On: 11/04/2022 02:51   CT HEAD WO CONTRAST ( )  Result Date: 11/04/2022 CLINICAL DATA:  Fall yesterday with headaches and neck pain, initial encounter EXAM: CT HEAD WITHOUT CONTRAST CT CERVICAL SPINE WITHOUT CONTRAST TECHNIQUE: Multidetector CT imaging of the head and cervical spine was performed following the standard protocol without intravenous contrast. Multiplanar CT image reconstructions of the cervical spine were also generated. RADIATION DOSE REDUCTION: This exam was performed according to the departmental dose-optimization program which includes automated exposure control, adjustment of the mA and/or kV according to patient size and/or use of iterative reconstruction technique. COMPARISON:  None Available. FINDINGS: CT HEAD FINDINGS  Brain: There are changes consistent with subarachnoid hemorrhage surrounding the cerebral peduncles and pons posteriorly. Diffuse foci of subarachnoid hemorrhage are noted near the vertex on the left related to the localized injury. Small subdural hematoma is noted on the left as well near the vertex. Vascular: No hyperdense vessel or unexpected calcification. Skull: Normal. Negative for fracture or focal lesion. Sinuses/Orbits: No acute finding. Other: Large scalp hematoma is noted on the left consistent with the recent injury. This measures approximately 5.2 x 2.1 cm in greatest AP and transverse dimensions respectively. CT CERVICAL SPINE FINDINGS Alignment: Within normal limits. Skull base and vertebrae: Seven cervical segments are well visualized. Vertebral body height is well maintained. No acute fracture or acute facet abnormality is noted. Multilevel osteophytic change and facet hypertrophic changes noted. The odontoid is within normal limits. Soft tissues and spinal canal: Surrounding soft tissue structures are within normal limits. Upper chest: Lung apices are unremarkable. Other: None IMPRESSION: CT of the head: Changes consistent with the known history of recent injury with a large left scalp hematoma in the parietal region near the vertex. Areas of subarachnoid hemorrhage in the basilar cisterns as well as on the left near the vertex. Small subdural component is noted as well on the left near the vertex. CT of the cervical spine: Multilevel degenerative change without acute abnormality. Critical Value/emergent results were called by telephone at the time of interpretation on 11/04/2022 at 1:41 am to [facility] , who verbally acknowledged these results. Electronically Signed   By: Alcide Clever M.D.   On: 11/04/2022 01:42   CT Cervical Spine Wo Contrast  Result Date: 11/04/2022 CLINICAL DATA:  Fall yesterday with headaches and neck pain, initial encounter EXAM: CT HEAD WITHOUT CONTRAST CT  CERVICAL SPINE WITHOUT CONTRAST TECHNIQUE: Multidetector CT imaging of the head and cervical spine was performed following the standard protocol without intravenous contrast. Multiplanar CT image reconstructions of the cervical spine were also generated. RADIATION DOSE REDUCTION: This exam was performed according to the departmental dose-optimization program which includes automated exposure control, adjustment of the mA and/or kV according to patient size and/or use of iterative reconstruction technique. COMPARISON:  None Available. FINDINGS: CT HEAD FINDINGS Brain: There are changes consistent with subarachnoid hemorrhage surrounding the cerebral peduncles and pons posteriorly. Diffuse foci of subarachnoid hemorrhage are noted near the vertex on the left related to the localized injury. Small subdural  hematoma is noted on the left as well near the vertex. Vascular: No hyperdense vessel or unexpected calcification. Skull: Normal. Negative for fracture or focal lesion. Sinuses/Orbits: No acute finding. Other: Large scalp hematoma is noted on the left consistent with the recent injury. This measures approximately 5.2 x 2.1 cm in greatest AP and transverse dimensions respectively. CT CERVICAL SPINE FINDINGS Alignment: Within normal limits. Skull base and vertebrae: Seven cervical segments are well visualized. Vertebral body height is well maintained. No acute fracture or acute facet abnormality is noted. Multilevel osteophytic change and facet hypertrophic changes noted. The odontoid is within normal limits. Soft tissues and spinal canal: Surrounding soft tissue structures are within normal limits. Upper chest: Lung apices are unremarkable. Other: None IMPRESSION: CT of the head: Changes consistent with the known history of recent injury with a large left scalp hematoma in the parietal region near the vertex. Areas of subarachnoid hemorrhage in the basilar cisterns as well as on the left near the vertex. Small  subdural component is noted as well on the left near the vertex. CT of the cervical spine: Multilevel degenerative change without acute abnormality. Critical Value/emergent results were called by telephone at the time of interpretation on 11/04/2022 at 1:41 am to [facility] , who verbally acknowledged these results. Electronically Signed   By: Alcide Clever M.D.   On: 11/04/2022 01:42   DG Chest 2 View  Result Date: 11/03/2022 CLINICAL DATA:  Suspected sepsis.  Fall. EXAM: CHEST - 2 VIEW COMPARISON:  06/10/2022 FINDINGS: Patient's chin obscures the apices on the frontal view. Prior median sternotomy with prosthetic cardiac valve. Left-sided pacemaker in place. No focal airspace disease, pleural effusion, pulmonary edema or pneumothorax. Thoracic spondylosis. Safety pin projects over the left clavicle. IMPRESSION: No acute chest findings. Electronically Signed   By: Narda Rutherford M.D.   On: 11/03/2022 21:42     Assessment/Plan: Estimated body mass index is 32.59 kg/m as calculated from the following:   Height as of 09/03/22: 5\' 9"  (1.753 m).   Weight as of this encounter: 100.6 kg.    67 year old female with multiple medical problems and an aortic valve on Coumadin admitted with traumatic subarachnoid blood in the basal cisterns and a tiny left subdural hematoma.  She looks great neurologically.  Will repeat head CT tomorrow.  Hold Coumadin for 3 days and then, if the repeat head CT looks okay, we will likely allow Lovenox followed by resumption of Coumadin  Tia Alert 11/04/2022 8:55 AM

## 2022-11-04 NOTE — Progress Notes (Signed)
Patient admitted earlier this morning for fall and was found to have possible diabetic foot infection along with scalp hematoma/subarachnoid hematoma/small subdural hematoma.  Patient seen and examined at bedside and plan of care discussed with her.  I have reviewed patient's medical records including this morning's H&P, current vitals and labs and medications myself.  Currently tachycardic in the 130s to 140s.  DC IV fluids.  Magnesium 1.4.  Replace.  Follow BMP from this morning.  Consult cardiology.  Follow neurosurgery recommendations.

## 2022-11-04 NOTE — Consult Note (Signed)
Cardiology Consultation   Patient ID: Donna Hopkins MRN: [id number]; DOB: [birth year]  Admit date: 11/03/2022 Date of Consult: 11/04/2022  PCP:  Dennard Schaumann, MD   History of Present Illness:   Donna Hopkins is a 67 year old woman with a history of mechanical aortic valve replacement on Coumadin, carotid artery stenosis, CKD 3, chronic systolic heart failure, CRT-D in situ, hypertension, hyperlipidemia, pulmonary hypertension.  She is known to me from the outpatient setting.  Her device was implanted by Dr. Johney Frame and is followed by me in clinic.  She presented to the hospital after a fall.  She tells me she tripped on a step at home and struck her head.  She was found to have a subarachnoid hemorrhage and subdural hematoma.  She also has a diabetic foot infection and is receiving IV antibiotics.  Today she is pleasant and interactive.  She is a good historian.  No syncope or presyncope.  She is very clear that she had a mechanical fall at home.  It is a step that has caused her problems before.  She wants to move out of her apartment for this reason.  She is tachycardic in the 130s at the beginning of my exam.  This is new for her.  No ICD therapies that we are aware of.   Past medical, surgical, social and family history reviewed.  ROS:  Please see the history of present illness.  All other ROS reviewed and negative.     Physical Exam/Data:   Vitals:   11/04/22 0623 11/04/22 0646 11/04/22 0838 11/04/22 0916  BP:  (!) 157/86 (!) 118/91 125/61  Pulse: (!) 112  (!) 133   Resp: 17  16   Temp: 99.3 F (37.4 C)  99.4 F (37.4 C)   TempSrc: Oral  Oral   SpO2: 98%  100%   Weight: 100.1 kg       General: Appears older than stated age, no distress sitting at 45 degrees in bed Cardiac:  normal S1, S2; tachycardic, warm extremities.  ICD pocket well-healed Lungs:  clear to auscultation bilaterally, no wheezing, rhonchi or rales  Psych:  Normal affect   EKG:  The EKG was  personally reviewed and demonstrates:   EKG from August 25 at 6 AM shows sinus tachycardia with biventricular pacing with ventricular rate of 106 bpm.  Telemetry:  Telemetry was personally reviewed and demonstrates: A sensed, V paced this morning transitioning to a wide-complex tachycardia with ventricular rates in the 130s.      Device interrogation performed at the bedside by me at 9 AM  Presenting rhythm was a sensed, V sense with one-to-one conduction with a rate of 130 bpm. Sensing was 1.4 in the a, greater than 12 in the V. Pacing threshold was 0.5 in the A, 0.875 in the V and 1.5 in the left ventricle.  Impedances were 400 in the A, 530 in the V, 700 and the LV with a high-voltage impedance of 75. With ventricular pacing I was able to terminate her arrhythmia while only pacing at 140 bpm.  She then returned to an a sensed, BiV paced rhythm at 92 bpm.      Assessment and Plan:   #Ventricular tachycardia Presenting rhythm this morning was ventricular tachycardia with a rate of 130 bpm.  It was well-tolerated hemodynamically.  She has a history of significant structural heart disease which is likely the mechanism.  I do not suspect that VT was the mechanism for her fall. -Start amiodarone drip -  Not a candidate for invasive coronary evaluation given subdural/subarachnoid hemorrhage -Update echo  #Chronic systolic heart failure #CRT-D in situ Continue Coreg, Lasix, ramipril  #Mechanical aortic valve Continue Coumadin  #Infection Diabetic foot infection. Given presence of mechanical heart valves and device, recommend blood cultures x 2.  #Subdural/subarachnoid hemorrhage Management per primary team.  Mentating appropriately this morning.   Sheria Lang T. Lalla Brothers, MD, [employer], [facility] Cardiac Electrophysiology

## 2022-11-04 NOTE — Sepsis Progress Note (Signed)
Since there was an increase in lactic acid, a secure chat was sent to the provider to determine if a third lactic acid should be collected. Provider placed new orders. Will continue to monitor code sepsis.

## 2022-11-04 NOTE — Progress Notes (Addendum)
Head CT reviewed which showed basilar cistern SAH after a fall yesterday. On coumadin for mechanical heart valve, stop for now. On abx for foot ulcer that is infected. Admission to medicine, repeat head CT in AM. Formal consult to follow.

## 2022-11-05 ENCOUNTER — Other Ambulatory Visit (HOSPITAL_COMMUNITY): Payer: 59

## 2022-11-05 ENCOUNTER — Inpatient Hospital Stay (HOSPITAL_COMMUNITY): Payer: 59

## 2022-11-05 DIAGNOSIS — L97909 Non-pressure chronic ulcer of unspecified part of unspecified lower leg with unspecified severity: Secondary | ICD-10-CM

## 2022-11-05 DIAGNOSIS — A419 Sepsis, unspecified organism: Secondary | ICD-10-CM | POA: Diagnosis not present

## 2022-11-05 DIAGNOSIS — I359 Nonrheumatic aortic valve disorder, unspecified: Secondary | ICD-10-CM

## 2022-11-05 DIAGNOSIS — I472 Ventricular tachycardia, unspecified: Secondary | ICD-10-CM | POA: Diagnosis not present

## 2022-11-05 DIAGNOSIS — L0390 Cellulitis, unspecified: Secondary | ICD-10-CM | POA: Diagnosis not present

## 2022-11-05 LAB — GLUCOSE, CAPILLARY
Glucose-Capillary: 104 mg/dL — ABNORMAL HIGH (ref 70–99)
Glucose-Capillary: 122 mg/dL — ABNORMAL HIGH (ref 70–99)
Glucose-Capillary: 133 mg/dL — ABNORMAL HIGH (ref 70–99)
Glucose-Capillary: 167 mg/dL — ABNORMAL HIGH (ref 70–99)

## 2022-11-05 LAB — COMPREHENSIVE METABOLIC PANEL
ALT: 11 U/L (ref 0–44)
AST: 19 U/L (ref 15–41)
Albumin: 2.8 g/dL — ABNORMAL LOW (ref 3.5–5.0)
Alkaline Phosphatase: 79 U/L (ref 38–126)
Anion gap: 11 (ref 5–15)
BUN: 12 mg/dL (ref 8–23)
CO2: 24 mmol/L (ref 22–32)
Calcium: 8 mg/dL — ABNORMAL LOW (ref 8.9–10.3)
Chloride: 97 mmol/L — ABNORMAL LOW (ref 98–111)
Creatinine, Ser: 0.86 mg/dL (ref 0.44–1.00)
GFR, Estimated: >60 mL/min (ref >60)
Glucose, Bld: 124 mg/dL — ABNORMAL HIGH (ref 70–99)
Potassium: 3.8 mmol/L (ref 3.5–5.1)
Sodium: 132 mmol/L — ABNORMAL LOW (ref 135–145)
Total Bilirubin: 1 mg/dL (ref 0.3–1.2)
Total Protein: 7.1 g/dL (ref 6.5–8.1)

## 2022-11-05 LAB — CBC
HCT: 31.4 % — ABNORMAL LOW (ref 36.0–46.0)
Hemoglobin: 9.7 g/dL — ABNORMAL LOW (ref 12.0–15.0)
MCH: 28.7 pg (ref 26.0–34.0)
MCHC: 30.9 g/dL (ref 30.0–36.0)
MCV: 92.9 fL (ref 80.0–100.0)
Platelets: 263 10*3/uL (ref 150–400)
RBC: 3.38 MIL/uL — ABNORMAL LOW (ref 3.87–5.11)
RDW: 14.5 % (ref 11.5–15.5)
WBC: 7.7 10*3/uL (ref 4.0–10.5)
nRBC: 0 % (ref 0.0–0.2)

## 2022-11-05 LAB — ECHOCARDIOGRAM COMPLETE
AR max vel: 1.84 cm2
AV Area VTI: 1.75 cm2
AV Area mean vel: 1.74 cm2
AV Mean grad: 19.7 mmHg
AV Peak grad: 37.6 mmHg
Ao pk vel: 3.07 m/s
Area-P 1/2: 3.12 cm2
S' Lateral: 2.8 cm
Weight: 3340.8 [oz_av]

## 2022-11-05 LAB — MAGNESIUM: Magnesium: 2.2 mg/dL (ref 1.7–2.4)

## 2022-11-05 LAB — VAS US ABI WITH/WO TBI: Right ABI: 1.09

## 2022-11-05 LAB — PROTIME-INR
INR: 4.7 (ref 0.8–1.2)
Prothrombin Time: 44.6 seconds — ABNORMAL HIGH (ref 11.4–15.2)

## 2022-11-05 MED ORDER — PERFLUTREN LIPID MICROSPHERE
1.0000 mL | INTRAVENOUS | Status: AC | PRN
  Administered 2022-11-05: 2 mL via INTRAVENOUS

## 2022-11-05 MED ORDER — ENSURE ENLIVE PO LIQD
237.0000 mL | Freq: Two times a day (BID) | ORAL | Status: DC
  Administered 2022-11-05 – 2022-11-10 (×9): 237 mL via ORAL

## 2022-11-05 MED ORDER — PNEUMOCOCCAL 20-VAL CONJ VACC 0.5 ML IM SUSY
0.5000 mL | PREFILLED_SYRINGE | INTRAMUSCULAR | Status: DC
  Filled 2022-11-05: qty 0.5

## 2022-11-05 MED ORDER — IOHEXOL 350 MG/ML SOLN
75.0000 mL | Freq: Once | INTRAVENOUS | Status: AC | PRN
  Administered 2022-11-05: 75 mL via INTRAVENOUS

## 2022-11-05 NOTE — Progress Notes (Signed)
Remote ICD transmission.   

## 2022-11-05 NOTE — Progress Notes (Signed)
ABI's have been completed. Preliminary results can be found in CV Proc through chart review.   11/05/22 1:57 PM Olen Cordial RVT

## 2022-11-05 NOTE — Plan of Care (Signed)

## 2022-11-05 NOTE — Progress Notes (Signed)
Mobility Specialist Progress Note:    11/05/22 1235  Mobility  Activity Ambulated with assistance in hallway  Level of Assistance Contact guard assist, steadying assist  Assistive Device Front wheel walker  Distance Ambulated (ft) 250 ft  Activity Response Tolerated well  Mobility Referral Yes  $Mobility charge 1 Mobility  Mobility Specialist Start Time (ACUTE ONLY) 1148  Mobility Specialist Stop Time (ACUTE ONLY) 1211  Mobility Specialist Time Calculation (min) (ACUTE ONLY) 23 min   Pt received in BR w/ NT, agreeable to ambulate in the halls. During ambulation pt c/o left hip pain, no rating given, and experienced left knee buckling twice, corrected independently w/ mobility specialist help. Pt returned to room left sitting on EOB w/ bed alarm on and call bell in reach. All needs met.  Thompson Grayer Mobility Specialist  Please contact vis Secure Chat or  Rehab Office [phone]

## 2022-11-05 NOTE — Evaluation (Signed)
Physical Therapy Evaluation Patient Details Name: Donna Hopkins MRN: [id number] DOB: 25-Feb-1956 Today's Date: 11/05/2022  History of Present Illness  67 yo female admitted 8/24 with fall, hitting her head on wall with small Lt SDH, sepsis, cellulitis. PMHx: AVR, carotid artery stenosis, CKD 3, chronic systolic heart failure, CRT-D, HTN, HLD, pulmonary hypertension.  Clinical Impression  Pt pleasant, talkative and reports living at home alone with several falls on the stairs. Pt not oriented to president, time or safety and states she drives and manages her medication but can't recall how many medications she takes. Pt also reporting intermittent diplopia since fall. Pt reports she can stay with daughter and have family assist and that she is looking for single story apartment. Pt with decreased cognition, safety awareness and balance who will benefit from acute therapy to maximize mobility and safety to decrease burden of care. For D/C recommend assist with stairs, medication, finances as well as intermittent supervision.         If plan is discharge home, recommend the following: A little help with bathing/dressing/bathroom;Assistance with cooking/housework;Direct supervision/assist for medications management;Assist for transportation;Supervision due to cognitive status;Direct supervision/assist for financial management;Help with stairs or ramp for entrance   Can travel by private vehicle        Equipment Recommendations None recommended by PT  Recommendations for Other Services       Functional Status Assessment Patient has had a recent decline in their functional status and/or demonstrates limited ability to make significant improvements in function in a reasonable and predictable amount of time     Precautions / Restrictions Precautions Precautions: Fall Precaution Comments: pt reports diplopia at times      Mobility  Bed Mobility Overal bed mobility: Needs Assistance Bed  Mobility: Sit to Supine       Sit to supine: Supervision   General bed mobility comments: sitting EOB on arrival, return to supine with supervision for lines and safety    Transfers Overall transfer level: Needs assistance   Transfers: Sit to/from Stand Sit to Stand: Contact guard assist           General transfer comment: CGA for lines and safety as pt reaching out for environmental support    Ambulation/Gait Ambulation/Gait assistance: Contact guard assist Gait Distance (Feet): 200 Feet Assistive device: Rolling walker (2 wheels) Gait Pattern/deviations: Step-through pattern, Decreased stride length   Gait velocity interpretation: 1.31 - 2.62 ft/sec, indicative of limited community ambulator   General Gait Details: pt with cues for direction and safety, pt talkative and easily distracted with lack of awareness for safety  Stairs Stairs: Yes Stairs assistance: Contact guard assist Stair Management: Step to pattern, Forwards, One rail Left Number of Stairs: 4 General stair comments: pt able to ascend with left hand on rail with assist for lines and safety. descent pt reaching for bil rails with cues for UB on rail and pt descending sideways, guarding for lines  Wheelchair Mobility     Tilt Bed    Modified Rankin (Stroke Patients Only)       Balance Overall balance assessment: Needs assistance, History of Falls Sitting-balance support: Feet supported Sitting balance-Leahy Scale: Fair     Standing balance support: Bilateral upper extremity supported Standing balance-Leahy Scale: Poor Standing balance comment: RW in standing                             Pertinent Vitals/Pain Pain Assessment Pain Assessment: 0-10 Pain Score:  4  Pain Location: sore left hip from fall Pain Descriptors / Indicators: Sore Pain Intervention(s): Limited activity within patient's tolerance, Repositioned, Monitored during session    Home Living Family/patient  expects to be discharged to:: Private residence Living Arrangements: Alone Available Help at Discharge: Family;Available 24 hours/day (per pt) Type of Home: Apartment Home Access: Level entry       Home Layout: Two level;Bed/bath upstairs;1/2 bath on main level Home Equipment: Rolling Walker (2 wheels);Cane - single point      Prior Function Prior Level of Function : Independent/Modified Independent;Driving;History of Falls (last six months)                     Extremity/Trunk Assessment   Upper Extremity Assessment Upper Extremity Assessment: Generalized weakness    Lower Extremity Assessment Lower Extremity Assessment: Generalized weakness    Cervical / Trunk Assessment Cervical / Trunk Assessment: Normal  Communication   Communication Communication: No apparent difficulties  Cognition Arousal: Alert Behavior During Therapy: WFL for tasks assessed/performed Overall Cognitive Status: Impaired/Different from baseline Area of Impairment: Orientation, Attention, Memory, Following commands, Safety/judgement, Awareness                 Orientation Level: Disoriented to, Time Current Attention Level: Sustained Memory: Decreased short-term memory Following Commands: Follows one step commands inconsistently, Follows one step commands with increased time Safety/Judgement: Decreased awareness of safety, Decreased awareness of deficits     General Comments: pt tangential, distracted with decreased attention and awareness. Pt not oriented to month, day or president. Pt states she lives alone and manages her own finances and medication        General Comments      Exercises     Assessment/Plan    PT Assessment Patient needs continued PT services  PT Problem List Decreased strength;Decreased mobility;Decreased safety awareness;Decreased activity tolerance;Decreased cognition;Decreased balance;Decreased knowledge of use of DME       PT Treatment  Interventions DME instruction;Therapeutic exercise;Gait training;Balance training;Stair training;Therapeutic activities;Functional mobility training;Patient/family education;Cognitive remediation;Neuromuscular re-education    PT Goals (Current goals can be found in the Care Plan section)  Acute Rehab PT Goals Patient Stated Goal: return home PT Goal Formulation: With patient Time For Goal Achievement: 11/19/22 Potential to Achieve Goals: Fair    Frequency Min 1X/week     Co-evaluation               AM-PAC PT "6 Clicks" Mobility  Outcome Measure Help needed turning from your back to your side while in a flat bed without using bedrails?: None Help needed moving from lying on your back to sitting on the side of a flat bed without using bedrails?: A Little Help needed moving to and from a bed to a chair (including a wheelchair)?: A Little Help needed standing up from a chair using your arms (e.g., wheelchair or bedside chair)?: A Little Help needed to walk in hospital room?: A Little Help needed climbing 3-5 steps with a railing? : A Little 6 Click Score: 19    End of Session   Activity Tolerance: Patient tolerated treatment well Patient left: in bed;with call bell/phone within reach;with bed alarm set (returned to bed for vascular study) Nurse Communication: Mobility status PT Visit Diagnosis: Other abnormalities of gait and mobility (R26.89);History of falling (Z91.81);Muscle weakness (generalized) (M62.81)    Time: 1241-1315 PT Time Calculation (min) (ACUTE ONLY): 34 min   Charges:   PT Evaluation $PT Eval Moderate Complexity: 1 Mod PT Treatments $Gait Training:  8-22 mins PT General Charges $$ ACUTE PT VISIT: 1 Visit         Merryl Hacker, PT Acute Rehabilitation Services Office: [phone]   Enedina Finner Emersyn Wyss 11/05/2022, 1:53 PM

## 2022-11-05 NOTE — Progress Notes (Signed)
  Patient Name: Donna Hopkins Date of Encounter: 11/06/2022   Primary Cardiologist: None Electrophysiologist: Lanier Prude, MD  Interval Summary   No new complaints this am. Worried about her pacemaker visit and her blood thinner.   Vital Signs    Vitals:   11/06/22 0000 11/06/22 0247 11/06/22 0349 11/06/22 0400  BP:  (!) 124/58 (!) 141/72   Pulse:  72 74   Resp:  10 17   Temp:   98.8 F (37.1 C)   TempSrc:   Oral   SpO2: 97% 96% 97% 98%  Weight:   100 kg     Intake/Output Summary (Last 24 hours) at 11/06/2022 0803 Last data filed at 11/06/2022 0352 Gross per 24 hour  Intake 1783.18 ml  Output 850 ml  Net 933.18 ml   Filed Weights   11/04/22 0623 11/05/22 0508 11/06/22 0349  Weight: 100.1 kg 94.7 kg 100 kg    Physical Exam    GEN- The patient is well appearing, alert and oriented x 3 today.   Lungs- Clear to ausculation bilaterally, normal work of breathing Cardiac- Regular rate and rhythm, no murmurs, rubs or gallops GI- soft, NT, ND, + BS Extremities- no clubbing or cyanosis. No edema  Telemetry    NSR 70-80s (personally reviewed)  Hospital Course    Donna Hopkins is a 67 year old woman with a history of mechanical aortic valve replacement on Coumadin, carotid artery stenosis, CKD 3, chronic systolic heart failure, CRT-D in situ, hypertension, hyperlipidemia, pulmonary hypertension. She is known to me from the outpatient setting. Her device was implanted by Dr. Johney Frame and is followed by me in clinic. She presented to the hospital after a fall. She tells me she tripped on a step at home and struck her head. She was found to have a subarachnoid hemorrhage and subdural hematoma. She also has a diabetic foot infection and is receiving IV antibiotics.   Assessment & Plan    Ventricular tachycardia With rates in 130s, though overall has tolerated well Likely 2/2 CHF and structural heart disease Do not suspect led to fall Transition to amiodarone 400 mg BID x 5  days then 200 mg BID x 5 days then 200 mg daily.  Not a candidate for invasive coronary evaluation given subdural/subarachnoid hemorrhage Echo 11/05/22 with LVEF 60-65%, grade 1 DD, with moderate LVH    Chronic systolic heart failure CRT-D in situ Continue Coreg, Lasix, ramipril as tolerated   S/p Mechanical aortic valve On Coumadin    Diabetic foot infection Blood cultures NGTD , Follow given presence of mechanical heart valves and device   Subdural/subarachnoid hemorrhage Management per primary team.   A little confused today.  CT scan 8/26 stable with mild hematoma progression of the scalp.   For questions or updates, please contact [facility] Please consult [email or web address] for contact info under Cardiology/STEMI.  Signed, Graciella Freer, PA-C  11/06/2022, 8:03 AM

## 2022-11-05 NOTE — Progress Notes (Signed)
   11/05/22 1330  TOC Brief Assessment  Insurance and Status Reviewed  Patient has primary care physician Yes  Home environment has been reviewed Yes  Prior level of function: Independent  Prior/Current Home Services No current home services  Social Determinants of Health Reivew SDOH reviewed needs interventions (May need help with transportation)  Readmission risk has been reviewed Yes  Transition of care needs transition of care needs identified, TOC will continue to follow (SDOH with transportation issues, may need assistance to get home at discharge.)

## 2022-11-05 NOTE — Progress Notes (Signed)
PROGRESS NOTE    Donna Hopkins  ZOX:[id number] DOB: 18-May-1955 DOA: 11/03/2022 PCP: Dennard Schaumann, MD   Brief Narrative:  67 y.o. female with a known history of mechanical aortic valve on Coumadin, carotid stenosis, chronic systolic heart failure, depression, diabetes, GERD, hypertension, AICD, hyperlipidemia, pulmonary hypertension, chronic left foot ulcer being treated recently with Augmentin by podiatry as an outpatient presented with a mechanical fall.  She was found to have possible diabetic left foot infection along with scalp hematoma/subarachnoid hematoma/small subdural hematoma.  She was started on broad-spectrum antibiotics.  Neurosurgery was consulted.  She subsequently was found to have possible ventricular tachycardia and cardiology was consulted who put the patient on amiodarone drip.  Assessment & Plan:   Sepsis: Present on admission Probable left diabetic foot infection Lactic acidosis: Resolved -Presented with fever, tachycardia, hypotension, lactic acidosis, leukocytosis with possible left diabetic foot infection. -Currently broad-spectrum antibiotics.  Follow cultures.  Podiatry following.  No CT evidence of osteomyelitis.  Mechanical fall leading to scalp hematoma/subarachnoid hematoma/small subdural hematoma -Neurosurgery following.  Currently being managed conservatively.  Follow repeat CT head from today. -Fall precautions.  PT eval.  Ventricular tachycardia -Cardiology/EP following.  Currently on amiodarone drip.  Supratherapeutic INR History of mechanical aortic valve on Coumadin -INR still elevated at 4.7 today.  Monitor.  Coumadin on hold.  Pharmacy has been consulted.  Acute kidney injury -Improved.  CKD 3 ruled out.  Chronic systolic heart failure History of AICD placement Hypertension -Cardiology following.  Strict intake output.  Elevate.  Fluid restriction.  Continue Coreg, Lasix, ramipril.  Diabetes mellitus type 2 with hyperglycemia -Continue  long-acting insulin along with CBC with SSI.  Carb modified diet. -A1c 7.8  Leukocytosis -Resolved  Hypokalemia -Resolved  Hypomagnesemia -Resolved  Hyponatremia -Mild.  Monitor.  Obesity -Outpatient follow-up  GERD -Protonix  DVT prophylaxis: SCDs Code Status: Full Family Communication: None at bedside Disposition Plan: Status is: Inpatient Remains inpatient appropriate because: Of severity of illness  Consultants: Neurosurgery, cardiology, podiatry  Procedures: None  Antimicrobials: Cefepime, vancomycin and Flagyl from 11/03/2022 onwards  Subjective: Patient seen and examined at bedside.  Denies any current chest pain, palpitations or worsening shortness breath.  Complains of intermittent headache.  No fever or vomiting reported.  Objective: Vitals:   11/04/22 1712 11/04/22 1943 11/05/22 0508 11/05/22 0831  BP: 127/60 115/62 (!) 103/54 135/66  Pulse: 77 82 72 75  Resp: 18 13 15 16   Temp: 98.1 F (36.7 C) 98.3 F (36.8 C) 98.4 F (36.9 C) 98.9 F (37.2 C)  TempSrc: Oral Oral Oral Oral  SpO2: 96% 100% 100% 100%  Weight:   94.7 kg     Intake/Output Summary (Last 24 hours) at 11/05/2022 1120 Last data filed at 11/05/2022 0400 Gross per 24 hour  Intake 1308.67 ml  Output --  Net 1308.67 ml   Filed Weights   11/04/22 0623 11/05/22 0508  Weight: 100.1 kg 94.7 kg    Examination:  General exam: Appears calm and comfortable.  Chronically ill and deconditioned.  On room air. Respiratory system: Bilateral decreased breath sounds at bases with scattered crackles Cardiovascular system: S1 & S2 heard, Rate controlled Gastrointestinal system: Abdomen is obese, nondistended, soft and nontender. Normal bowel sounds heard. Extremities: No cyanosis, clubbing; trace lower extremity edema present Central nervous system: Alert and oriented. No focal neurological deficits. Moving extremities Skin: No ecchymosis.  Left foot dressing present. Psychiatry: Flat affect.   Not agitated.   Data Reviewed: I have personally reviewed following labs and imaging studies  CBC: Recent Labs  Lab 11/03/22 2116 11/05/22 0419  WBC 11.7* 7.7  NEUTROABS 8.0*  --   HGB 11.7* 9.7*  HCT 36.8 31.4*  MCV 89.8 92.9  PLT 372 263   Basic Metabolic Panel: Recent Labs  Lab 11/03/22 2116 11/04/22 0654 11/05/22 0419  NA 133* 132* 132*  K 3.2* 3.2* 3.8  CL 94* 96* 97*  CO2 26 23 24   GLUCOSE 195* 156* 124*  BUN 10 11 12   CREATININE 1.19* 0.90 0.86  CALCIUM 9.1 8.2* 8.0*  MG  --  1.4* 2.2   GFR: Estimated Creatinine Clearance: 75.6 mL/min (by C-G formula based on SCr of 0.86 mg/dL). Liver Function Tests: Recent Labs  Lab 11/03/22 2116 11/04/22 0942 11/05/22 0419  AST 25 17 19   ALT 18 13 11   ALKPHOS 103 78 79  BILITOT 1.1 0.9 1.0  PROT 8.4* 6.5 7.1  ALBUMIN 3.5 2.6* 2.8*   No results for input(s): "LIPASE", "AMYLASE" in the last 168 hours. No results for input(s): "AMMONIA" in the last 168 hours. Coagulation Profile: Recent Labs  Lab 11/03/22 2116 11/05/22 0419  INR 4.1* 4.7*   Cardiac Enzymes: Recent Labs  Lab 11/03/22 2116  CKTOTAL 98   BNP (last 3 results) No results for input(s): "PROBNP" in the last 8760 hours. HbA1C: Recent Labs    11/04/22 0654  HGBA1C 7.8*   CBG: Recent Labs  Lab 11/04/22 0842 11/04/22 1251 11/04/22 1623 11/04/22 2103 11/05/22 0829  GLUCAP 167* 207* 175* 210* 104*   Lipid Profile: No results for input(s): "CHOL", "HDL", "LDLCALC", "TRIG", "CHOLHDL", "LDLDIRECT" in the last 72 hours. Thyroid Function Tests: Recent Labs    11/04/22 0942  TSH 1.534   Anemia Panel: No results for input(s): "VITAMINB12", "FOLATE", "FERRITIN", "TIBC", "IRON", "RETICCTPCT" in the last 72 hours. Sepsis Labs: Recent Labs  Lab 11/03/22 2128 11/03/22 2301 11/04/22 0246 11/04/22 0428  LATICACIDVEN 2.8* 3.1* 2.2* 1.8    Recent Results (from the past 240 hour(s))  Culture, blood (Routine x 2)     Status: None  (Preliminary result)   Collection Time: 11/03/22  9:12 PM   Specimen: BLOOD  Result Value Ref Range Status   Specimen Description BLOOD BLOOD RIGHT ARM  Final   Special Requests   Final    BOTTLES DRAWN AEROBIC ONLY Blood Culture adequate volume   Culture   Final    NO GROWTH < 12 HOURS Performed at [facility], [address], [place], [place] [address]    Report Status PENDING  Incomplete  Culture, blood (Routine x 2)     Status: None (Preliminary result)   Collection Time: 11/03/22  9:16 PM   Specimen: BLOOD  Result Value Ref Range Status   Specimen Description BLOOD BLOOD LEFT ARM  Final   Special Requests   Final    BOTTLES DRAWN AEROBIC AND ANAEROBIC Blood Culture adequate volume   Culture   Final    NO GROWTH < 12 HOURS Performed at [facility], [address], [place], [place] [address]    Report Status PENDING  Incomplete         Radiology Studies: CT FOOT LEFT W CONTRAST  Result Date: 11/05/2022 CLINICAL DATA:  Left plantar foot wound. Evaluate for osteomyelitis. EXAM: CT OF THE LOWER LEFT EXTREMITY WITH CONTRAST TECHNIQUE: Multidetector CT imaging of the lower left extremity was performed according to the standard protocol following intravenous contrast administration. RADIATION DOSE REDUCTION: This exam was performed according to the departmental  dose-optimization program which includes automated exposure control, adjustment of the mA and/or kV according to patient size and/or use of iterative reconstruction technique. CONTRAST:  75mL OMNIPAQUE IOHEXOL 350 MG/ML SOLN COMPARISON:  Left foot x-rays from yesterday. FINDINGS: Bones/Joint/Cartilage No bony destruction or periosteal reaction. No fracture or dislocation. Mid foot and great toe degenerative changes. No joint effusion. Ligaments Ligaments are suboptimally evaluated by CT. Muscles and Tendons Grossly intact. Complete fatty atrophy of the intrinsic foot muscles. Soft tissue Soft tissue swelling and  ulceration at plantar aspect of the first metatarsal head. No discrete fluid collection or subcutaneous emphysema. No soft tissue mass. IMPRESSION: 1. Soft tissue swelling and ulceration at the plantar aspect of the first metatarsal head. No CT evidence of osteomyelitis. Electronically Signed   By: Obie Dredge M.D.   On: 11/05/2022 11:16   DG Foot 2 Views Left  Result Date: 11/04/2022 CLINICAL DATA:  Recent fall with foot pain, initial encounter EXAM: LEFT FOOT - 2 VIEW COMPARISON:  None Available. FINDINGS: Plantar arch is flattened. Tarsal degenerative changes are seen. Calcaneal spurs are noted. No acute fracture or dislocation is noted. IMPRESSION: Chronic changes without acute abnormality. Electronically Signed   By: Alcide Clever M.D.   On: 11/04/2022 02:55   DG Femur Min 2 Views Left  Result Date: 11/04/2022 CLINICAL DATA:  Recent fall with left leg pain, initial encounter EXAM: LEFT FEMUR 2 VIEWS COMPARISON:  None Available. FINDINGS: Degenerative changes of the hip joint are noted. No acute fracture or dislocation is noted. No soft tissue changes are seen. IMPRESSION: No acute abnormality noted. Electronically Signed   By: Alcide Clever M.D.   On: 11/04/2022 02:55   DG Wrist Complete Right  Result Date: 11/04/2022 CLINICAL DATA:  Recent fall with wrist pain, initial encounter EXAM: RIGHT WRIST - COMPLETE 3+ VIEW COMPARISON:  None Available. FINDINGS: No acute fracture or dislocation is noted. Mild soft tissue swelling is noted. Mild degenerative changes of the first [facility] joint are noted. 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 IMPRESSION: Mild soft tissue swelling without acute bony abnormality. Electronically Signed   By: Alcide Clever M.D.   On: 11/04/2022 02:53   DG Hip Unilat W or Wo Pelvis 2-3 Views Left  Result Date: 11/04/2022 CLINICAL DATA:  Recent fall on blood thinners with left hip pain, initial encounter EXAM: DG HIP (WITH OR WITHOUT PELVIS) 3V LEFT COMPARISON:   06/10/2022 FINDINGS: Pelvic ring is intact. Degenerative changes of the hip joints are noted bilaterally. Degenerative changes of lumbar spine are noted. No acute fracture or dislocation is seen. No soft tissue changes are noted. Degenerative change without acute abnormality. IMPRESSION: Negative. Electronically Signed   By: Alcide Clever M.D.   On: 11/04/2022 02:51   CT HEAD WO CONTRAST ( )  Result Date: 11/04/2022 CLINICAL DATA:  Fall yesterday with headaches and neck pain, initial encounter EXAM: CT HEAD WITHOUT CONTRAST CT CERVICAL SPINE WITHOUT CONTRAST TECHNIQUE: Multidetector CT imaging of the head and cervical spine was performed following the standard protocol without intravenous contrast. Multiplanar CT image reconstructions of the cervical spine were also generated. RADIATION DOSE REDUCTION: This exam was performed according to the departmental dose-optimization program which includes automated exposure control, adjustment of the mA and/or kV according to patient size and/or use of iterative reconstruction technique. COMPARISON:  None Available. FINDINGS: CT HEAD FINDINGS Brain: There are changes consistent with subarachnoid hemorrhage surrounding the cerebral peduncles and pons posteriorly. Diffuse foci of subarachnoid hemorrhage are noted near the vertex  on the left related to the localized injury. Small subdural hematoma is noted on the left as well near the vertex. Vascular: No hyperdense vessel or unexpected calcification. Skull: Normal. Negative for fracture or focal lesion. Sinuses/Orbits: No acute finding. Other: Large scalp hematoma is noted on the left consistent with the recent injury. This measures approximately 5.2 x 2.1 cm in greatest AP and transverse dimensions respectively. CT CERVICAL SPINE FINDINGS Alignment: Within normal limits. Skull base and vertebrae: Seven cervical segments are well visualized. Vertebral body height is well maintained. No acute fracture or acute facet  abnormality is noted. Multilevel osteophytic change and facet hypertrophic changes noted. The odontoid is within normal limits. Soft tissues and spinal canal: Surrounding soft tissue structures are within normal limits. Upper chest: Lung apices are unremarkable. Other: None IMPRESSION: CT of the head: Changes consistent with the known history of recent injury with a large left scalp hematoma in the parietal region near the vertex. Areas of subarachnoid hemorrhage in the basilar cisterns as well as on the left near the vertex. Small subdural component is noted as well on the left near the vertex. CT of the cervical spine: Multilevel degenerative change without acute abnormality. Critical Value/emergent results were called by telephone at the time of interpretation on 11/04/2022 at 1:41 am to [facility] , who verbally acknowledged these results. Electronically Signed   By: Alcide Clever M.D.   On: 11/04/2022 01:42   CT Cervical Spine Wo Contrast  Result Date: 11/04/2022 CLINICAL DATA:  Fall yesterday with headaches and neck pain, initial encounter EXAM: CT HEAD WITHOUT CONTRAST CT CERVICAL SPINE WITHOUT CONTRAST TECHNIQUE: Multidetector CT imaging of the head and cervical spine was performed following the standard protocol without intravenous contrast. Multiplanar CT image reconstructions of the cervical spine were also generated. RADIATION DOSE REDUCTION: This exam was performed according to the departmental dose-optimization program which includes automated exposure control, adjustment of the mA and/or kV according to patient size and/or use of iterative reconstruction technique. COMPARISON:  None Available. FINDINGS: CT HEAD FINDINGS Brain: There are changes consistent with subarachnoid hemorrhage surrounding the cerebral peduncles and pons posteriorly. Diffuse foci of subarachnoid hemorrhage are noted near the vertex on the left related to the localized injury. Small subdural hematoma is noted on the  left as well near the vertex. Vascular: No hyperdense vessel or unexpected calcification. Skull: Normal. Negative for fracture or focal lesion. Sinuses/Orbits: No acute finding. Other: Large scalp hematoma is noted on the left consistent with the recent injury. This measures approximately 5.2 x 2.1 cm in greatest AP and transverse dimensions respectively. CT CERVICAL SPINE FINDINGS Alignment: Within normal limits. Skull base and vertebrae: Seven cervical segments are well visualized. Vertebral body height is well maintained. No acute fracture or acute facet abnormality is noted. Multilevel osteophytic change and facet hypertrophic changes noted. The odontoid is within normal limits. Soft tissues and spinal canal: Surrounding soft tissue structures are within normal limits. Upper chest: Lung apices are unremarkable. Other: None IMPRESSION: CT of the head: Changes consistent with the known history of recent injury with a large left scalp hematoma in the parietal region near the vertex. Areas of subarachnoid hemorrhage in the basilar cisterns as well as on the left near the vertex. Small subdural component is noted as well on the left near the vertex. CT of the cervical spine: Multilevel degenerative change without acute abnormality. Critical Value/emergent results were called by telephone at the time of interpretation on 11/04/2022 at 1:41 am to [facility] ,  who verbally acknowledged these results. Electronically Signed   By: Alcide Clever M.D.   On: 11/04/2022 01:42   DG Chest 2 View  Result Date: 11/03/2022 CLINICAL DATA:  Suspected sepsis.  Fall. EXAM: CHEST - 2 VIEW COMPARISON:  06/10/2022 FINDINGS: Patient's chin obscures the apices on the frontal view. Prior median sternotomy with prosthetic cardiac valve. Left-sided pacemaker in place. No focal airspace disease, pleural effusion, pulmonary edema or pneumothorax. Thoracic spondylosis. Safety pin projects over the left clavicle. IMPRESSION: No acute  chest findings. Electronically Signed   By: Narda Rutherford M.D.   On: 11/03/2022 21:42        Scheduled Meds:  atorvastatin  10 mg Oral Daily   carvedilol  25 mg Oral BID WC   ferrous sulfate  325 mg Oral Q breakfast   furosemide  80 mg Oral Daily   gabapentin  300 mg Oral TID   insulin aspart  0-15 Units Subcutaneous TID WC   insulin aspart  0-5 Units Subcutaneous QHS   insulin detemir  40 Units Subcutaneous BID   pantoprazole  40 mg Oral Daily   ramipril  20 mg Oral Daily   sertraline  100 mg Oral Daily   sodium chloride flush  3 mL Intravenous Q12H   Continuous Infusions:  amiodarone 30 mg/hr (11/05/22 1020)   ceFEPime (MAXIPIME) IV 2 g (11/05/22 1610)   metronidazole 500 mg (11/05/22 0106)   vancomycin 1,500 mg (11/05/22 0250)          Glade Lloyd, MD [facility] 11/05/2022, 11:20 AM

## 2022-11-06 ENCOUNTER — Ambulatory Visit: Payer: 59 | Admitting: Podiatry

## 2022-11-06 DIAGNOSIS — A419 Sepsis, unspecified organism: Secondary | ICD-10-CM | POA: Diagnosis not present

## 2022-11-06 DIAGNOSIS — L0390 Cellulitis, unspecified: Secondary | ICD-10-CM | POA: Diagnosis not present

## 2022-11-06 DIAGNOSIS — I472 Ventricular tachycardia, unspecified: Secondary | ICD-10-CM | POA: Diagnosis not present

## 2022-11-06 LAB — CBC WITH DIFFERENTIAL/PLATELET
Abs Immature Granulocytes: 0.03 10*3/uL (ref 0.00–0.07)
Basophils Absolute: 0 10*3/uL (ref 0.0–0.1)
Basophils Relative: 0 %
Eosinophils Absolute: 0.4 10*3/uL (ref 0.0–0.5)
Eosinophils Relative: 6 %
HCT: 33.1 % — ABNORMAL LOW (ref 36.0–46.0)
Hemoglobin: 9.9 g/dL — ABNORMAL LOW (ref 12.0–15.0)
Immature Granulocytes: 0 %
Lymphocytes Relative: 27 %
Lymphs Abs: 1.9 10*3/uL (ref 0.7–4.0)
MCH: 28.4 pg (ref 26.0–34.0)
MCHC: 29.9 g/dL — ABNORMAL LOW (ref 30.0–36.0)
MCV: 94.8 fL (ref 80.0–100.0)
Monocytes Absolute: 0.8 10*3/uL (ref 0.1–1.0)
Monocytes Relative: 11 %
Neutro Abs: 4.1 10*3/uL (ref 1.7–7.7)
Neutrophils Relative %: 56 %
Platelets: 324 10*3/uL (ref 150–400)
RBC: 3.49 MIL/uL — ABNORMAL LOW (ref 3.87–5.11)
RDW: 14.3 % (ref 11.5–15.5)
WBC: 7.3 10*3/uL (ref 4.0–10.5)
nRBC: 0 % (ref 0.0–0.2)

## 2022-11-06 LAB — BASIC METABOLIC PANEL
Anion gap: 12 (ref 5–15)
BUN: 13 mg/dL (ref 8–23)
CO2: 24 mmol/L (ref 22–32)
Calcium: 8.4 mg/dL — ABNORMAL LOW (ref 8.9–10.3)
Chloride: 98 mmol/L (ref 98–111)
Creatinine, Ser: 0.82 mg/dL (ref 0.44–1.00)
GFR, Estimated: >60 mL/min (ref >60)
Glucose, Bld: 104 mg/dL — ABNORMAL HIGH (ref 70–99)
Potassium: 3.8 mmol/L (ref 3.5–5.1)
Sodium: 134 mmol/L — ABNORMAL LOW (ref 135–145)

## 2022-11-06 LAB — GLUCOSE, CAPILLARY
Glucose-Capillary: 112 mg/dL — ABNORMAL HIGH (ref 70–99)
Glucose-Capillary: 129 mg/dL — ABNORMAL HIGH (ref 70–99)
Glucose-Capillary: 226 mg/dL — ABNORMAL HIGH (ref 70–99)
Glucose-Capillary: 114 mg/dL — ABNORMAL HIGH (ref 70–99)
Glucose-Capillary: 136 mg/dL — ABNORMAL HIGH (ref 70–99)

## 2022-11-06 LAB — MAGNESIUM: Magnesium: 2 mg/dL (ref 1.7–2.4)

## 2022-11-06 LAB — PROTIME-INR
INR: 3.8 — ABNORMAL HIGH (ref 0.8–1.2)
Prothrombin Time: 37.4 seconds — ABNORMAL HIGH (ref 11.4–15.2)

## 2022-11-06 MED ORDER — AMIODARONE HCL 200 MG PO TABS: 200.0000 mg | ORAL_TABLET | Freq: Two times a day (BID) | ORAL | Status: DC

## 2022-11-06 MED ORDER — SODIUM CHLORIDE 0.9 % IV SOLN
2.0000 g | INTRAVENOUS | Status: DC
  Administered 2022-11-06: 2 g via INTRAVENOUS
  Filled 2022-11-06 (×2): qty 20

## 2022-11-06 MED ORDER — AMIODARONE HCL 200 MG PO TABS
400.0000 mg | ORAL_TABLET | Freq: Two times a day (BID) | ORAL | Status: DC
  Administered 2022-11-06 – 2022-11-10 (×9): 400 mg via ORAL
  Filled 2022-11-06 (×9): qty 2

## 2022-11-06 NOTE — Progress Notes (Signed)
PROGRESS NOTE    Donna Hopkins  NUU:[id number] DOB: 12-12-1955 DOA: 11/03/2022 PCP: Dennard Schaumann, MD   Brief Narrative:  67 y.o. female with a known history of mechanical aortic valve on Coumadin, carotid stenosis, chronic systolic heart failure, depression, diabetes, GERD, hypertension, AICD, hyperlipidemia, pulmonary hypertension, chronic left foot ulcer being treated recently with Augmentin by podiatry as an outpatient presented with a mechanical fall.  She was found to have possible diabetic left foot infection along with scalp hematoma/subarachnoid hematoma/small subdural hematoma.  She was started on broad-spectrum antibiotics.  Neurosurgery was consulted.  She subsequently was found to have possible ventricular tachycardia and cardiology was consulted who put the patient on amiodarone drip.  Assessment & Plan:   Sepsis: Present on admission Probable left diabetic foot infection Lactic acidosis: Resolved -Presented with fever, tachycardia, hypotension, lactic acidosis, leukocytosis with possible left diabetic foot infection. -Currently on broad-spectrum antibiotics.  Cultures negative so far.  Podiatry following.  No CT evidence of osteomyelitis.  Mechanical fall leading to scalp hematoma/subarachnoid hematoma/small subdural hematoma -Neurosurgery following.  Currently being managed conservatively.  Repeat CT head on 11/05/2022 showed no progression of subdural and subarachnoid hemorrhage; no new abnormality.  Follow further neurosurgery recommendations: Had recommended on 11/04/2022 to hold Coumadin for 3 days; if the repeat CT head looks okay, would likely allow Lovenox followed by resumption of Coumadin. -Fall precautions.  PT eval.  Ventricular tachycardia -Cardiology/EP following.  Amiodarone drip has been switched to oral amiodarone.  Echo showed EF of 60 to 65% with grade 1 diastolic dysfunction.  Supratherapeutic INR History of mechanical aortic valve on Coumadin -INR still  elevated at 3.8 today.  Monitor.  Coumadin on hold.  Pharmacy has been consulted.  Acute kidney injury -Improved.  CKD 3 ruled out.  Chronic systolic heart failure History of AICD placement Hypertension -Cardiology following.  Strict intake output.  Daily weights.  Fluid restriction.  Continue Coreg, Lasix, ramipril.  Echo as above.  Diabetes mellitus type 2 with hyperglycemia -Continue long-acting insulin along with CBC with SSI.  Carb modified diet. -A1c 7.8  Leukocytosis -Resolved  Hypokalemia -Resolved  Hypomagnesemia -Resolved  Hyponatremia -Mild.  Monitor.  Obesity -Outpatient follow-up  GERD -Protonix  DVT prophylaxis: SCDs Code Status: Full Family Communication: Niece at bedside Disposition Plan: Status is: Inpatient Remains inpatient appropriate because: Of severity of illness  Consultants: Neurosurgery, cardiology, podiatry  Procedures: None  Antimicrobials: Cefepime, vancomycin and Flagyl from 11/03/2022 onwards  Subjective: Patient seen and examined at bedside.  No fever, vomiting or chest pain reported.  Denies any worsening shortness of breath or palpitations. Objective: Vitals:   11/06/22 0247 11/06/22 0349 11/06/22 0400 11/06/22 0816  BP: (!) 124/58 (!) 141/72  115/75  Pulse: 72 74  82  Resp: 10 17  (!) 21  Temp:  98.8 F (37.1 C)  98.1 F (36.7 C)  TempSrc:  Oral  Oral  SpO2: 96% 97% 98% 98%  Weight:  100 kg      Intake/Output Summary (Last 24 hours) at 11/06/2022 0831 Last data filed at 11/06/2022 0824 Gross per 24 hour  Intake 1786.18 ml  Output 850 ml  Net 936.18 ml   Filed Weights   11/04/22 0623 11/05/22 0508 11/06/22 0349  Weight: 100.1 kg 94.7 kg 100 kg    Examination:  General: On room air.  No distress.  Looks chronically ill and deconditioned. ENT/neck: No thyromegaly.  JVD is not elevated  respiratory: Decreased breath sounds at bases bilaterally with some crackles; no wheezing  CVS: S1-S2 heard, rate controlled  currently Abdominal: Soft, obese, nontender, slightly distended; no organomegaly, bowel sounds are heard Extremities: Trace lower extremity edema; no cyanosis  CNS: Awake and alert.  No focal neurologic deficit.  Moves extremities Lymph: No obvious lymphadenopathy Skin: Left foot dressing present.  No petechiae.   Psych: Mostly flat affect.  Currently not agitated. Musculoskeletal: No obvious other joint swelling/deformity    Data Reviewed: I have personally reviewed following labs and imaging studies  CBC: Recent Labs  Lab 11/03/22 2116 11/05/22 0419 11/06/22 0457  WBC 11.7* 7.7 7.3  NEUTROABS 8.0*  --  4.1  HGB 11.7* 9.7* 9.9*  HCT 36.8 31.4* 33.1*  MCV 89.8 92.9 94.8  PLT 372 263 324   Basic Metabolic Panel: Recent Labs  Lab 11/03/22 2116 11/04/22 0654 11/05/22 0419 11/06/22 0457  NA 133* 132* 132* 134*  K 3.2* 3.2* 3.8 3.8  CL 94* 96* 97* 98  CO2 26 23 24 24   GLUCOSE 195* 156* 124* 104*  BUN 10 11 12 13   CREATININE 1.19* 0.90 0.86 0.82  CALCIUM 9.1 8.2* 8.0* 8.4*  MG  --  1.4* 2.2 2.0   GFR: Estimated Creatinine Clearance: 81.5 mL/min (by C-G formula based on SCr of 0.82 mg/dL). Liver Function Tests: Recent Labs  Lab 11/03/22 2116 11/04/22 0942 11/05/22 0419  AST 25 17 19   ALT 18 13 11   ALKPHOS 103 78 79  BILITOT 1.1 0.9 1.0  PROT 8.4* 6.5 7.1  ALBUMIN 3.5 2.6* 2.8*   No results for input(s): "LIPASE", "AMYLASE" in the last 168 hours. No results for input(s): "AMMONIA" in the last 168 hours. Coagulation Profile: Recent Labs  Lab 11/03/22 2116 11/05/22 0419 11/06/22 0457  INR 4.1* 4.7* 3.8*   Cardiac Enzymes: Recent Labs  Lab 11/03/22 2116  CKTOTAL 98   BNP (last 3 results) No results for input(s): "PROBNP" in the last 8760 hours. HbA1C: Recent Labs    11/04/22 0654  HGBA1C 7.8*   CBG: Recent Labs  Lab 11/05/22 1154 11/05/22 1541 11/05/22 2024 11/06/22 0742 11/06/22 0814  GLUCAP 122* 133* 167* 129* 112*   Lipid  Profile: No results for input(s): "CHOL", "HDL", "LDLCALC", "TRIG", "CHOLHDL", "LDLDIRECT" in the last 72 hours. Thyroid Function Tests: Recent Labs    11/04/22 0942  TSH 1.534   Anemia Panel: No results for input(s): "VITAMINB12", "FOLATE", "FERRITIN", "TIBC", "IRON", "RETICCTPCT" in the last 72 hours. Sepsis Labs: Recent Labs  Lab 11/03/22 2128 11/03/22 2301 11/04/22 0246 11/04/22 0428  LATICACIDVEN 2.8* 3.1* 2.2* 1.8    Recent Results (from the past 240 hour(s))  Culture, blood (Routine x 2)     Status: None (Preliminary result)   Collection Time: 11/03/22  9:12 PM   Specimen: BLOOD  Result Value Ref Range Status   Specimen Description BLOOD BLOOD RIGHT ARM  Final   Special Requests   Final    BOTTLES DRAWN AEROBIC ONLY Blood Culture adequate volume   Culture   Final    NO GROWTH 2 DAYS Performed at [facility], [address], [place], [place] [address]    Report Status PENDING  Incomplete  Culture, blood (Routine x 2)     Status: None (Preliminary result)   Collection Time: 11/03/22  9:16 PM   Specimen: BLOOD  Result Value Ref Range Status   Specimen Description BLOOD BLOOD LEFT ARM  Final   Special Requests   Final    BOTTLES DRAWN AEROBIC AND ANAEROBIC Blood Culture adequate volume  Culture   Final    NO GROWTH 2 DAYS Performed at [facility], [address], [place], [place] [address]    Report Status PENDING  Incomplete  Aerobic/Anaerobic Culture w Gram Stain (surgical/deep wound)     Status: None (Preliminary result)   Collection Time: 11/04/22  6:01 AM   Specimen: Other Source; Wound  Result Value Ref Range Status   Specimen Description OTHER  Final   Special Requests Foot, Left  Final   Gram Stain   Final    NO WBC SEEN NO ORGANISMS SEEN Performed at [facility], [address], [place], [place] [address]    Culture PENDING  Incomplete   Report Status PENDING  Incomplete         Radiology Studies: ECHOCARDIOGRAM  COMPLETE  Result Date: 11/05/2022    ECHOCARDIOGRAM REPORT   Patient Name:   FRANCHELLE RECIO Date of Exam: 11/05/2022 Medical Rec #:  [id number]     Height:       69.0 in Accession #:    [id number]    Weight:       208.8 lb Date of Birth:  Aug 03, 1955     BSA:          2.104 m Patient Age:    67 years      BP:           121/85 mmHg Patient Gender: F             HR:           78 bpm. Exam Location:  Inpatient Procedure: 2D Echo, Color Doppler, Cardiac Doppler and Intracardiac            Opacification Agent Indications:    Aortic Valve Disease  History:        Patient has prior history of Echocardiogram examinations, most                 recent 02/04/2018. CHF, SAH, Signs/Symptoms:Sepsis; Risk                 Factors:Diabetes.                 Aortic Valve: mechanical valve is present in the aortic                 position.  Sonographer:    Milbert Coulter Referring Phys: [id number] Rossie Muskrat LAMBERT IMPRESSIONS  1. Left ventricular ejection fraction, by estimation, is 60 to 65%. The left ventricle has normal function. The left ventricle has no regional wall motion abnormalities. There is moderate concentric left ventricular hypertrophy. Left ventricular diastolic parameters are consistent with Grade I diastolic dysfunction (impaired relaxation).  2. Right ventricular systolic function is normal. The right ventricular size is normal. Tricuspid regurgitation signal is inadequate for assessing PA pressure.  3. The mitral valve is normal in structure. No evidence of mitral valve regurgitation. No evidence of mitral stenosis.  4. The aortic valve was not well visualized. Aortic valve regurgitation is not visualized. No aortic stenosis is present. There is a mechanical valve present in the aortic position. Aortic valve area, by VTI measures 1.75 cm. Aortic valve mean gradient  measures 19.7 mmHg. Aortic valve Vmax measures 3.07 m/s. DVI stable at 0.36. Normal functioning prosthesis. No significant change in mean AVG, DVI or Vmax.   5. The inferior vena cava is normal in size with greater than 50% respiratory variability, suggesting right atrial pressure of 3 mmHg.  6. Compared  to study dated 06/06/2021, there does not appear to be any significant perivalvular leak of the mechanical AVR. FINDINGS  Left Ventricle: Left ventricular ejection fraction, by estimation, is 60 to 65%. The left ventricle has normal function. The left ventricle has no regional wall motion abnormalities. Definity contrast agent was given IV to delineate the left ventricular  endocardial borders. The left ventricular internal cavity size was normal in size. There is moderate concentric left ventricular hypertrophy. Left ventricular diastolic parameters are consistent with Grade I diastolic dysfunction (impaired relaxation). Normal left ventricular filling pressure. Right Ventricle: The right ventricular size is normal. No increase in right ventricular wall thickness. Right ventricular systolic function is normal. Tricuspid regurgitation signal is inadequate for assessing PA pressure. Left Atrium: Left atrial size was normal in size. Right Atrium: Right atrial size was normal in size. Pericardium: There is no evidence of pericardial effusion. Mitral Valve: The mitral valve is normal in structure. No evidence of mitral valve regurgitation. No evidence of mitral valve stenosis. Tricuspid Valve: The tricuspid valve is normal in structure. Tricuspid valve regurgitation is not demonstrated. No evidence of tricuspid stenosis. Aortic Valve: The aortic valve was not well visualized. Aortic valve regurgitation is not visualized. No aortic stenosis is present. Aortic valve mean gradient measures 19.7 mmHg. Aortic valve peak gradient measures 37.6 mmHg. Aortic valve area, by VTI measures 1.75 cm. There is a mechanical valve present in the aortic position. Pulmonic Valve: The pulmonic valve was normal in structure. Pulmonic valve regurgitation is not visualized. No evidence of pulmonic  stenosis. Aorta: The aortic root is normal in size and structure. Venous: The inferior vena cava is normal in size with greater than 50% respiratory variability, suggesting right atrial pressure of 3 mmHg. IAS/Shunts: No atrial level shunt detected by color flow Doppler. Additional Comments: A device lead is visualized.  LEFT VENTRICLE PLAX 2D LVIDd:         4.10 cm   Diastology LVIDs:         2.80 cm   LV e' medial:    5.22 cm/s LV PW:         1.40 cm   LV E/e' medial:  16.4 LV IVS:        1.50 cm   LV e' lateral:   8.70 cm/s LVOT diam:     2.50 cm   LV E/e' lateral: 9.8 LV SV:         99 LV SV Index:   47 LVOT Area:     4.91 cm  RIGHT VENTRICLE RV Basal diam:  2.90 cm RV Mid diam:    2.50 cm RV S prime:     8.49 cm/s TAPSE (M-mode): 2.2 cm LEFT ATRIUM             Index        RIGHT ATRIUM           Index LA Vol (A2C):   36.2 ml 17.20 ml/m  RA Area:     12.70 cm LA Vol (A4C):   38.5 ml 18.30 ml/m  RA Volume:   29.70 ml  14.11 ml/m LA Biplane Vol: 37.8 ml 17.96 ml/m  AORTIC VALVE AV Area (Vmax):    1.84 cm AV Area (Vmean):   1.74 cm AV Area (VTI):     1.75 cm AV Vmax:           306.67 cm/s AV Vmean:          202.667 cm/s AV VTI:  0.564 m AV Peak Grad:      37.6 mmHg AV Mean Grad:      19.7 mmHg LVOT Vmax:         115.00 cm/s LVOT Vmean:        71.800 cm/s LVOT VTI:          0.201 m LVOT/AV VTI ratio: 0.36 MITRAL VALVE MV Area (PHT): 3.12 cm     SHUNTS MV Decel Time: 243 msec     Systemic VTI:  0.20 m MV E velocity: 85.40 cm/s   Systemic Diam: 2.50 cm MV A velocity: 118.00 cm/s MV E/A ratio:  0.72 Armanda Magic MD Electronically signed by Armanda Magic MD Signature Date/Time: 11/05/2022/5:41:11 PM    Final    VAS [place] ABI WITH/WO TBI  Result Date: 11/05/2022  LOWER EXTREMITY DOPPLER STUDY Patient Name:  JENNAFER BODINE  Date of Exam:   11/05/2022 Medical Rec #: [id number]      Accession #:    [id number] Date of Birth: 09-03-1955      Patient Gender: F Patient Age:   74 years Exam Location:  [facility] Procedure:      VAS [place] ABI WITH/WO TBI Referring Phys: ADAM MCDONALD --------------------------------------------------------------------------------  Indications: Ulceration. High Risk Factors: Hypertension, Diabetes.  Comparison Study: No prior studies. Performing Technologist: Olen Cordial RVT  Examination Guidelines: A complete evaluation includes at minimum, Doppler waveform signals and systolic blood pressure reading at the level of bilateral brachial, anterior tibial, and posterior tibial arteries, when vessel segments are accessible. Bilateral testing is considered an integral part of a complete examination. Photoelectric Plethysmograph (PPG) waveforms and toe systolic pressure readings are included as required and additional duplex testing as needed. Limited examinations for reoccurring indications may be performed as noted.  ABI Findings: +---------+------------------+-----+-----------+--------+ Right    Rt Pressure (mmHg)IndexWaveform   Comment  +---------+------------------+-----+-----------+--------+ Brachial 84                     triphasic           +---------+------------------+-----+-----------+--------+ PTA      90                0.93 multiphasic         +---------+------------------+-----+-----------+--------+ DP       106               1.09 multiphasic         +---------+------------------+-----+-----------+--------+ Great Toe29                0.30                     +---------+------------------+-----+-----------+--------+ +---------+------------------+-----+-----------+-------+ Left     Lt Pressure (mmHg)IndexWaveform   Comment +---------+------------------+-----+-----------+-------+ Brachial 97                     triphasic          +---------+------------------+-----+-----------+-------+ PTA      254               2.62 multiphasic        +---------+------------------+-----+-----------+-------+ DP       97                1.00  multiphasic        +---------+------------------+-----+-----------+-------+ Great Toe58                0.60                    +---------+------------------+-----+-----------+-------+ +-------+-----------+-----------+------------+------------+  ABI/TBIToday's ABIToday's TBIPrevious ABIPrevious TBI +-------+-----------+-----------+------------+------------+ Right  1.09       0.3                                 +-------+-----------+-----------+------------+------------+ Left   [place]         0.6                                 +-------+-----------+-----------+------------+------------+  Summary: Right: Resting right ankle-brachial index is within normal range. The right toe-brachial index is abnormal. Left: Resting left ankle-brachial index indicates noncompressible left lower extremity arteries. The left toe-brachial index is abnormal. *See table(s) above for measurements and observations.  Electronically signed by Lemar Livings MD on 11/05/2022 at 4:09:00 PM.    Final    CT HEAD WO CONTRAST ( )  Result Date: 11/05/2022 CLINICAL DATA:  Follow-up traumatic brain injury. EXAM: CT HEAD WITHOUT CONTRAST TECHNIQUE: Contiguous axial images were obtained from the base of the skull through the vertex without intravenous contrast. RADIATION DOSE REDUCTION: This exam was performed according to the departmental dose-optimization program which includes automated exposure control, adjustment of the mA and/or kV according to patient size and/or use of iterative reconstruction technique. COMPARISON:  Yesterday FINDINGS: Brain: Unchanged subarachnoid hemorrhage at the supra vermian and ambient cisterns. No hydrocephalus. A small mainly low-density subdural hematoma along the left convexity is unchanged at up to 4 mm in thickness and without mass effect. No evidence of the acute infarct. 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 Vascular: High-density structures after recent enhanced CT. Skull: Large  left parietal scalp hematoma without underlying fracture or opaque foreign body. Sinuses/Orbits: No evidence of injury IMPRESSION: 1. No progression of subdural and subarachnoid hemorrhage. No new abnormality. 2. Large left scalp hematoma without interval progression Electronically Signed   By: Tiburcio Pea M.D.   On: 11/05/2022 11:51   CT FOOT LEFT W CONTRAST  Result Date: 11/05/2022 CLINICAL DATA:  Left plantar foot wound. Evaluate for osteomyelitis. EXAM: CT OF THE LOWER LEFT EXTREMITY WITH CONTRAST TECHNIQUE: Multidetector CT imaging of the lower left extremity was performed according to the standard protocol following intravenous contrast administration. RADIATION DOSE REDUCTION: This exam was performed according to the departmental dose-optimization program which includes automated exposure control, adjustment of the mA and/or kV according to patient size and/or use of iterative reconstruction technique. CONTRAST:  75mL OMNIPAQUE IOHEXOL 350 MG/ML SOLN COMPARISON:  Left foot x-rays from yesterday. FINDINGS: Bones/Joint/Cartilage No bony destruction or periosteal reaction. No fracture or dislocation. Mid foot and great toe degenerative changes. No joint effusion. Ligaments Ligaments are suboptimally evaluated by CT. Muscles and Tendons Grossly intact. Complete fatty atrophy of the intrinsic foot muscles. Soft tissue Soft tissue swelling and ulceration at plantar aspect of the first metatarsal head. No discrete fluid collection or subcutaneous emphysema. No soft tissue mass. IMPRESSION: 1. Soft tissue swelling and ulceration at the plantar aspect of the first metatarsal head. No CT evidence of osteomyelitis. Electronically Signed   By: Obie Dredge M.D.   On: 11/05/2022 11:16        Scheduled Meds:  [START ON 11/11/2022] amiodarone  200 mg Oral BID   amiodarone  400 mg Oral BID   atorvastatin  10 mg Oral Daily   carvedilol  25 mg Oral BID WC   feeding supplement  237 mL Oral BID BM   ferrous  sulfate  325 mg Oral Q breakfast   furosemide  80 mg Oral Daily   gabapentin  300 mg Oral TID   insulin aspart  0-15 Units Subcutaneous TID WC   insulin aspart  0-5 Units Subcutaneous QHS   insulin detemir  40 Units Subcutaneous BID   pantoprazole  40 mg Oral Daily   pneumococcal 20-valent conjugate vaccine  0.5 mL Intramuscular Tomorrow-1000   ramipril  20 mg Oral Daily   sertraline  100 mg Oral Daily   sodium chloride flush  3 mL Intravenous Q12H   Continuous Infusions:  ceFEPime (MAXIPIME) IV 2 g (11/06/22 0200)   metronidazole 500 mg (11/05/22 2323)   vancomycin 1,500 mg (11/06/22 0159)          Glade Lloyd, MD [facility] 11/06/2022, 8:31 AM

## 2022-11-06 NOTE — TOC CAGE-AID Note (Signed)
Transition of Care [facility]) - CAGE-AID Screening   Patient Details  Name: Donna Hopkins MRN: [id number] Date of Birth: Apr 12, 1955  [facility]) CM/SW Contact:    Leota Sauers, RN Phone Number: 11/06/2022, 5:33 AM   Clinical Narrative:  Patient denies use of alcohol and illicit drugs. Resources not given at this time.   CAGE-AID Screening:    Have You Ever Felt You Ought to Cut Down on Your Drinking or Drug Use?: No Have People Annoyed You By Critizing Your Drinking Or Drug Use?: No Have You Felt Bad Or Guilty About Your Drinking Or Drug Use?: No Have You Ever Had a Drink or Used Drugs First Thing In The Morning to Steady Your Nerves or to Get Rid of a Hangover?: No CAGE-AID Score: 0  Substance Abuse Education Offered: No

## 2022-11-06 NOTE — Evaluation (Signed)
Occupational Therapy Evaluation Patient Details Name: Donna Hopkins MRN: [id number] DOB: 08-Aug-1955 Today's Date: 11/06/2022   History of Present Illness 67 yo female admitted 8/24 with fall, hitting her head on wall with small Lt SDH, sepsis, cellulitis. PMHx: AVR, carotid artery stenosis, CKD 3, chronic systolic heart failure, CRT-D, HTN, HLD, pulmonary hypertension.   Clinical Impression      Patient reports living at home alone in 2 story home with 7 STE and is Independent in ADLs and IADLs.  Patient seems to have decreased cognition and speaking about how she hasn't seen PT while she has been here but yet PT evaluated her yesterday.  Patient demo's decreased safety 2/2 patient found standing at sink attempting to bathe self after CNA had taken her to the bathroom and told patient to call for help when she was finished, which she didn't.  Patient reporting that  reports she can stay with niece  when she goes home. Patient currently presents with decreased safety, increased fall risk, lack of insight to deficits and need for A for ADLs for overall safety. Patient  will benefit from additional OT intervention to address functional deficits in order for patient to return to PLOF         If plan is discharge home, recommend the following: A little help with walking and/or transfers;Direct supervision/assist for medications management;Direct supervision/assist for financial management;Assist for transportation;Supervision due to cognitive status;Assistance with cooking/housework    Functional Status Assessment  Patient has had a recent decline in their functional status and demonstrates the ability to make significant improvements in function in a reasonable and predictable amount of time.  Equipment Recommendations  Tub/shower bench    Recommendations for Other Services       Precautions / Restrictions Precautions Precautions: Fall Precaution Comments: pt reports diplopia at  times Restrictions Weight Bearing Restrictions: No      Mobility Bed Mobility                    Transfers Overall transfer level: Needs assistance Equipment used: Rolling walker (2 wheels) Transfers: Sit to/from Stand Sit to Stand: Contact guard assist           General transfer comment: CGA for lines and safety as pt reaching out for environmental support      Balance Overall balance assessment: Needs assistance, History of Falls Sitting-balance support: Feet supported Sitting balance-Leahy Scale: Fair         Standing balance comment: RW in standing                           ADL either performed or assessed with clinical judgement   ADL Overall ADL's : Needs assistance/impaired Eating/Feeding: Set up;Sitting   Grooming: Contact guard assist;Standing   Upper Body Bathing: Set up;Sitting   Lower Body Bathing: Minimal assistance;Sitting/lateral leans   Upper Body Dressing : Set up;Sitting   Lower Body Dressing: Minimal assistance   Toilet Transfer: Contact guard assist;Grab bars;Regular Toilet   Toileting- Clothing Manipulation and Hygiene: Contact guard assist       Functional mobility during ADLs: Contact guard assist;Rolling walker (2 wheels) General ADL Comments: patient  demos decreased safety and steadiness, patient found standing at sink attempting to wash uip despite CNA telling her to ask for help once she was taken to the bathroom, patient getting up without assist from toilet and standing when therapist entered room     Vision Baseline Vision/History: 1 Wears  glasses Patient Visual Report: No change from baseline       Perception         Praxis         Pertinent Vitals/Pain Pain Assessment Pain Assessment: 0-10 Pain Score: 2  Pain Location: sore left hip from fall Pain Descriptors / Indicators: Sore Pain Intervention(s): Monitored during session     Extremity/Trunk Assessment Upper Extremity  Assessment Upper Extremity Assessment: Generalized weakness   Lower Extremity Assessment Lower Extremity Assessment: Defer to PT evaluation   Cervical / Trunk Assessment Cervical / Trunk Assessment: Normal   Communication Communication Communication: No apparent difficulties   Cognition Arousal: Alert Behavior During Therapy: WFL for tasks assessed/performed Overall Cognitive Status: Impaired/Different from baseline Area of Impairment: Safety/judgement, Problem solving, Memory                 Orientation Level: Time Current Attention Level: Sustained Memory: Decreased short-term memory Following Commands: Follows one step commands inconsistently, Follows one step commands with increased time Safety/Judgement: Decreased awareness of safety, Decreased awareness of deficits           General Comments       Exercises     Shoulder Instructions      Home Living Family/patient expects to be discharged to:: Private residence Living Arrangements: Alone Available Help at Discharge: Family;Available 24 hours/day Type of Home: Apartment Home Access: Level entry     Home Layout: Two level;Bed/bath upstairs;1/2 bath on main level     Bathroom Shower/Tub: Chief Strategy Officer: Standard     Home Equipment: Agricultural consultant (2 wheels);Cane - single point          Prior Functioning/Environment Prior Level of Function : Independent/Modified Independent;Driving;History of Falls (last six months)                        OT Problem List: Decreased strength;Decreased activity tolerance;Impaired balance (sitting and/or standing);Decreased cognition;Decreased safety awareness      OT Treatment/Interventions: Self-care/ADL training;Therapeutic exercise;Neuromuscular education;DME and/or AE instruction;Therapeutic activities;Patient/family education    OT Goals(Current goals can be found in the care plan section) Acute Rehab OT Goals OT Goal  Formulation: With patient Time For Goal Achievement: 11/20/22 Potential to Achieve Goals: Good  OT Frequency: Min 1X/week    Co-evaluation              AM-PAC OT "6 Clicks" Daily Activity     Outcome Measure Help from another person eating meals?: A Little Help from another person taking care of personal grooming?: A Little Help from another person toileting, which includes using toliet, bedpan, or urinal?: A Little Help from another person bathing (including washing, rinsing, drying)?: A Little Help from another person to put on and taking off regular upper body clothing?: A Little Help from another person to put on and taking off regular lower body clothing?: A Little 6 Click Score: 18   End of Session Equipment Utilized During Treatment: Gait belt;Rolling walker (2 wheels) Nurse Communication: Mobility status (fall risk and how therapist found her standing at sink upon entry into her room)  Activity Tolerance: Patient tolerated treatment well Patient left: in bed;with call bell/phone within reach;with bed alarm set  OT Visit Diagnosis: Unsteadiness on feet (R26.81);Repeated falls (R29.6);Muscle weakness (generalized) (M62.81)                Time: 2542-7062 OT Time Calculation (min): 17 min Charges:  OT General Charges $OT Visit: 1 Visit Governor Specking  OT/L  Denice Paradise 11/06/2022, 2:30 PM

## 2022-11-06 NOTE — TOC Initial Note (Signed)
Transition of Care [facility]) - Initial/Assessment Note    Patient Details  Name: Donna Hopkins MRN: [id number] Date of Birth: [birth year]  [facility] CM/SW Contact:    Gala Lewandowsky, RN Phone Number: 11/06/2022, 3:40 PM  Clinical Narrative: Patient presented for fall at home. PTA patient was from home alone; however, she has support of two daughters Donna Hopkins and Donna Hopkins that check in on the patient periodically. Case Manager received verbal permission from patient to reach out to [facility] regarding disposition needs. Patient lives in an apartment and has DME cane in the home. Per Donna Hopkins, patient was independent prior to arrival and driving to appointments. PT/OT has recommended home health PT/OT and the daughter does not have a preference for agency choice. Case Manager called [facility] and they can accept the patient for services. Start of care to begin within 24-48 hours post transition home. Donna Hopkins states patient will have additional family members checking in on the patient once she gets home. No further needs identified at this time.                   Expected Discharge Plan: Home w Home Health Services Barriers to Discharge: Continued Medical Work up   Patient Goals and CMS Choice Patient states their goals for this hospitalization and ongoing recovery are:: patient wants to return home.   Choice offered to / list presented to : Adult Children (no preference family wants agency in network.)    Expected Discharge Plan and Services In-house Referral: NA Discharge Planning Services: CM Consult Post Acute Care Choice: Home Health Living arrangements for the past 2 months: Apartment                   DME Agency: NA      HH Arranged: OT, PT HH Agency: [facility] Date HH Agency Contacted: 11/06/22 Time HH Agency Contacted: 1540 Representative spoke with at [facility] Agency: Tresa Endo  Prior Living Arrangements/Services Living arrangements for the past  2 months: Apartment Lives with:: Self, Adult Children (Per daughter Donna Hopkins and Donna Hopkins patient will have family in and out checking on the patient.) Patient language and need for interpreter reviewed:: Yes Do you feel safe going back to the place where you live?: Yes      Need for Family Participation in Patient Care: Yes (Comment) Care giver support system in place?: Yes (comment)   Criminal Activity/Legal Involvement Pertinent to Current Situation/Hospitalization: No - Comment as needed  Activities of Daily Living Home Assistive Devices/Equipment: Cane (specify quad or straight), CBG Meter, Eyeglasses, Long-handled sponge, Walker (specify type) (straight and quad) ADL Screening (condition at time of admission) Patient's cognitive ability adequate to safely complete daily activities?: Yes Is the patient deaf or have difficulty hearing?: Yes Does the patient have difficulty seeing, even when wearing glasses/contacts?: Yes (blurry vision on right eye) Does the patient have difficulty concentrating, remembering, or making decisions?: Yes Patient able to express need for assistance with ADLs?: Yes Does the patient have difficulty dressing or bathing?: No Independently performs ADLs?: Yes (appropriate for developmental age) Does the patient have difficulty walking or climbing stairs?: No Weakness of Legs: Both Weakness of Hopkins/Hands: None  Permission Sought/Granted Permission sought to share information with : Family Supports, Case Production designer, theatre/television/film, Oceanographer granted to share information with : Yes, Verbal Permission Granted     Permission granted to share info w AGENCY: [facility]        Emotional Assessment Appearance::  Appears stated age Attitude/Demeanor/Rapport: Engaged Affect (typically observed): Appropriate Orientation: : Oriented to Self, Oriented to Place Alcohol / Substance Use: Not Applicable Psych Involvement: No (comment)  Admission  diagnosis:  Subarachnoid bleed (HCC) [I60.9] Sepsis due to cellulitis (HCC) [L03.90, A41.9] Sepsis with acute renal failure without septic shock, due to unspecified organism, unspecified acute renal failure type (HCC) [A41.9, R65.20, N17.9] Patient Active Problem List   Diagnosis Date Noted   Sepsis due to cellulitis (HCC) 11/04/2022   Sepsis with acute renal failure without septic shock (HCC) 11/04/2022   Subarachnoid bleed (HCC) 11/04/2022   Ulcer of left foot with fat layer exposed (HCC) 11/04/2022   Type 2 diabetes mellitus with left diabetic foot infection (HCC) 11/04/2022   Chronic systolic dysfunction of left ventricle 04/16/2018   Lumbar spondylosis 08/02/2011   Cervical spondylosis without myelopathy 08/02/2011   Diabetic neuropathy (HCC) 08/02/2011   Trochanteric bursitis of left hip 08/02/2011   Carpal tunnel syndrome 08/02/2011   PCP:  Dennard Schaumann, MD Pharmacy:   [id number] - [place], [place] - [address] [place] [address] Phone: [phone] Fax: [phone]  [id number] [place], [place] - [address] AT [facility] [address] [place] [address] Phone: [phone] Fax: [phone]  Social Determinants of Health (SDOH) Social History: SDOH Screenings   Food Insecurity: No Food Insecurity (11/05/2022)  Housing: Low Risk  (11/05/2022)  Transportation Needs: Unmet Transportation Needs (11/05/2022)  Utilities: Not At Risk (11/05/2022)  Financial Resource Strain: Medium Risk (06/10/2020)  Tobacco Use: Medium Risk (11/04/2022)    Readmission Risk Interventions     No data to display

## 2022-11-06 NOTE — Progress Notes (Incomplete)
  Patient Name: Donna Hopkins Date of Encounter: 11/07/2022    Primary Cardiologist: None Electrophysiologist: Lanier Prude, MD  Interval Summary   No further VT. NAEO.   Vital Signs    Vitals:   11/06/22 2000 11/07/22 0000 11/07/22 0343 11/07/22 0400  BP:   139/70   Pulse:   76   Resp:   16   Temp:   98.8 F (37.1 C)   TempSrc:   Oral   SpO2: 100% 100% 100% 100%  Weight:        Intake/Output Summary (Last 24 hours) at 11/07/2022 0802 Last data filed at 11/06/2022 1508 Gross per 24 hour  Intake 366.2 ml  Output --  Net 366.2 ml   Filed Weights   11/04/22 0623 11/05/22 0508 11/06/22 0349  Weight: 100.1 kg 94.7 kg 100 kg    Physical Exam    GEN- The patient is well appearing, alert and oriented x 3 today.   Lungs- Clear to ausculation bilaterally, normal work of breathing Cardiac- Regular rate and rhythm, no murmurs, rubs or gallops GI- soft, NT, ND, + BS Extremities- no clubbing or cyanosis. No edema  Telemetry    NSR 70s mostly  (personally reviewed)  Hospital Course    Donna Hopkins is a 67 year old woman with a history of mechanical aortic valve replacement on Coumadin, carotid artery stenosis, CKD 3, chronic systolic heart failure, CRT-D in situ, hypertension, hyperlipidemia, pulmonary hypertension. She is known to me from the outpatient setting. Her device was implanted by Dr. Johney Frame and is followed by me in clinic. She presented to the hospital after a fall. She tells me she tripped on a step at home and struck her head. She was found to have a subarachnoid hemorrhage and subdural hematoma. She also has a diabetic foot infection and is receiving IV antibiotics.   Assessment & Plan    Ventricular tachycardia With rates in 130s, though overall has tolerated well Likely 2/2 CHF and structural heart disease Do not suspect led to fall Continue amiodarone 400 mg BID x 5 days then 200 mg BID x 5 days then 200 mg daily.  Not a candidate for invasive coronary  evaluation given subdural/subarachnoid hemorrhage Echo 11/05/22 with LVEF 60-65%, grade 1 DD, with moderate LVH    Chronic systolic heart failure CRT-D in situ Continue Coreg, Lasix, ramipril as tolerated   S/p Mechanical aortic valve On Coumadin    Diabetic foot infection Blood cultures NGTD , Follow given presence of mechanical heart valves and device   Subdural/subarachnoid hemorrhage Management per primary team.   A little confused today.  CT scan 8/26 stable with mild hematoma progression of the scalp  No further VT.  EP will see as needed while remains here. Please call with questions or further arrhyhtmia.   Follow up made.  For questions or updates, please contact [facility] Please consult [email or web address] for contact info under Cardiology/STEMI.  Signed, Graciella Freer, PA-C  11/07/2022, 8:02 AM

## 2022-11-06 NOTE — Progress Notes (Signed)
Physical Therapy Treatment Patient Details Name: Donna Hopkins MRN: [id number] DOB: 1956/01/10 Today's Date: 11/06/2022   History of Present Illness 67 yo female admitted 8/24 with fall, hitting her head on wall with small Lt SDH, sepsis, cellulitis. PMHx: AVR, carotid artery stenosis, CKD 3, chronic systolic heart failure, CRT-D, HTN, HLD, pulmonary hypertension.    PT Comments  Pt seen for PT tx with pt asleep in bed but awakened & agreeable. Pt is tangential throughout entirety of session, requiring cuing for attention to task at hand. Pt is able to complete bed mobility with mod I, STS with CGA<>supervision, and ambulation with RW & CGA<>supervision. Pt requires RW for safe gait 2/2 BLE weakness & A/P instability in standing. Pt would benefit from ongoing PT services to address strengthening, balance, & gait with LRAD.   If plan is discharge home, recommend the following: A little help with bathing/dressing/bathroom;Assistance with cooking/housework;Direct supervision/assist for medications management;Assist for transportation;Supervision due to cognitive status;Direct supervision/assist for financial management;Help with stairs or ramp for entrance   Can travel by private vehicle        Equipment Recommendations  None recommended by PT    Recommendations for Other Services       Precautions / Restrictions Precautions Precautions: Fall Precaution Comments: pt reports diplopia at times Restrictions Weight Bearing Restrictions: No     Mobility  Bed Mobility Overal bed mobility: Modified Independent Bed Mobility: Sit to Supine, Supine to Sit     Supine to sit: Modified independent (Device/Increase time), HOB elevated Sit to supine: Modified independent (Device/Increase time)        Transfers Overall transfer level: Needs assistance Equipment used: Rolling walker (2 wheels), None Transfers: Sit to/from Stand Sit to Stand: Contact guard assist, Supervision            General transfer comment: STS from EOB without AD, STS from toilet with supervision with RW & grab bar    Ambulation/Gait Ambulation/Gait assistance: Contact guard assist, Supervision Gait Distance (Feet): 200 Feet Assistive device: Rolling walker (2 wheels) Gait Pattern/deviations: Step-through pattern, Decreased stride length Gait velocity: decreased     General Gait Details: Pt ambulated with CGA fade to supervision but increased to CGA after 1 LOB to L with min assist to correct, pt reporting intermittent dizziness that quickly went away.   Stairs             Wheelchair Mobility     Tilt Bed    Modified Rankin (Stroke Patients Only)       Balance Overall balance assessment: Needs assistance, History of Falls Sitting-balance support: Feet supported Sitting balance-Leahy Scale: Fair     Standing balance support: Bilateral upper extremity supported Standing balance-Leahy Scale: Poor                              Cognition Arousal: Alert Behavior During Therapy: WFL for tasks assessed/performed, Impulsive Overall Cognitive Status: Impaired/Different from baseline Area of Impairment: Safety/judgement, Problem solving, Memory, Awareness                       Following Commands: Follows one step commands inconsistently, Follows one step commands with increased time Safety/Judgement: Decreased awareness of safety, Decreased awareness of deficits Awareness: Anticipatory   General Comments: Pt with decreased safety awareness, letting go of walker to get hand sanitizer when walking, tangential throughout session.        Exercises Other Exercises Other Exercises: Pt  performed 10x STS from EOB without BUE support with CGA<>min assist with PT providing cuing & faciltiation for increased anterior weight shifting, cuing to not lean posteriorly on BLE on EOB but pt still with posterior lean during task.    General Comments General comments (skin  integrity, edema, etc.): Pt toileted during session with continent void, performing peri hygiene without assistance.      Pertinent Vitals/Pain Pain Assessment Pain Assessment: No/denies pain    Home Living Family/patient expects to be discharged to:: Private residence Living Arrangements: Alone Available Help at Discharge: Family;Available 24 hours/day Type of Home: Apartment Home Access: Level entry       Home Layout: Two level;Bed/bath upstairs;1/2 bath on main level Home Equipment: Rolling Walker (2 wheels);Cane - single point      Prior Function            PT Goals (current goals can now be found in the care plan section) Acute Rehab PT Goals Patient Stated Goal: return home PT Goal Formulation: With patient Time For Goal Achievement: 11/19/22 Potential to Achieve Goals: Fair Progress towards PT goals: Progressing toward goals    Frequency    Min 1X/week      PT Plan      Co-evaluation              AM-PAC PT "6 Clicks" Mobility   Outcome Measure  Help needed turning from your back to your side while in a flat bed without using bedrails?: None Help needed moving from lying on your back to sitting on the side of a flat bed without using bedrails?: None Help needed moving to and from a bed to a chair (including a wheelchair)?: A Little Help needed standing up from a chair using your arms (e.g., wheelchair or bedside chair)?: A Little Help needed to walk in hospital room?: A Little Help needed climbing 3-5 steps with a railing? : A Little 6 Click Score: 20    End of Session Equipment Utilized During Treatment: Gait belt Activity Tolerance: Patient tolerated treatment well Patient left: in bed;with call bell/phone within reach;with bed alarm set Nurse Communication: Mobility status PT Visit Diagnosis: Other abnormalities of gait and mobility (R26.89);History of falling (Z91.81);Muscle weakness (generalized) (M62.81);Unsteadiness on feet (R26.81)      Time: 1191-4782 PT Time Calculation (min) (ACUTE ONLY): 29 min  Charges:    $Therapeutic Activity: 23-37 mins PT General Charges $$ ACUTE PT VISIT: 1 Visit                     Aleda Grana, PT, DPT 11/06/22, 2:39 PM   Sandi Mariscal 11/06/2022, 2:38 PM

## 2022-11-06 NOTE — Plan of Care (Signed)

## 2022-11-07 DIAGNOSIS — L0390 Cellulitis, unspecified: Secondary | ICD-10-CM | POA: Diagnosis not present

## 2022-11-07 DIAGNOSIS — A419 Sepsis, unspecified organism: Secondary | ICD-10-CM | POA: Diagnosis not present

## 2022-11-07 LAB — CBC WITH DIFFERENTIAL/PLATELET
Abs Immature Granulocytes: 0.03 10*3/uL (ref 0.00–0.07)
Basophils Absolute: 0 10*3/uL (ref 0.0–0.1)
Basophils Relative: 1 %
Eosinophils Absolute: 0.3 10*3/uL (ref 0.0–0.5)
Eosinophils Relative: 4 %
HCT: 31.5 % — ABNORMAL LOW (ref 36.0–46.0)
Hemoglobin: 9.7 g/dL — ABNORMAL LOW (ref 12.0–15.0)
Immature Granulocytes: 1 %
Lymphocytes Relative: 19 %
Lymphs Abs: 1.2 10*3/uL (ref 0.7–4.0)
MCH: 28.5 pg (ref 26.0–34.0)
MCHC: 30.8 g/dL (ref 30.0–36.0)
MCV: 92.6 fL (ref 80.0–100.0)
Monocytes Absolute: 0.7 10*3/uL (ref 0.1–1.0)
Monocytes Relative: 11 %
Neutro Abs: 4.2 10*3/uL (ref 1.7–7.7)
Neutrophils Relative %: 64 %
Platelets: 310 10*3/uL (ref 150–400)
RBC: 3.4 MIL/uL — ABNORMAL LOW (ref 3.87–5.11)
RDW: 14.2 % (ref 11.5–15.5)
WBC: 6.5 10*3/uL (ref 4.0–10.5)
nRBC: 0 % (ref 0.0–0.2)

## 2022-11-07 LAB — MAGNESIUM: Magnesium: 1.7 mg/dL (ref 1.7–2.4)

## 2022-11-07 LAB — BASIC METABOLIC PANEL
Anion gap: 13 (ref 5–15)
BUN: 11 mg/dL (ref 8–23)
CO2: 22 mmol/L (ref 22–32)
Calcium: 8.5 mg/dL — ABNORMAL LOW (ref 8.9–10.3)
Chloride: 99 mmol/L (ref 98–111)
Creatinine, Ser: 0.74 mg/dL (ref 0.44–1.00)
GFR, Estimated: >60 mL/min (ref >60)
Glucose, Bld: 127 mg/dL — ABNORMAL HIGH (ref 70–99)
Potassium: 3.7 mmol/L (ref 3.5–5.1)
Sodium: 134 mmol/L — ABNORMAL LOW (ref 135–145)

## 2022-11-07 LAB — PROTIME-INR
INR: 2.2 — ABNORMAL HIGH (ref 0.8–1.2)
Prothrombin Time: 24.3 s — ABNORMAL HIGH (ref 11.4–15.2)

## 2022-11-07 LAB — GLUCOSE, CAPILLARY
Glucose-Capillary: 126 mg/dL — ABNORMAL HIGH (ref 70–99)
Glucose-Capillary: 146 mg/dL — ABNORMAL HIGH (ref 70–99)
Glucose-Capillary: 210 mg/dL — ABNORMAL HIGH (ref 70–99)
Glucose-Capillary: 198 mg/dL — ABNORMAL HIGH (ref 70–99)

## 2022-11-07 MED ORDER — WARFARIN SODIUM 5 MG PO TABS
5.0000 mg | ORAL_TABLET | Freq: Once | ORAL | Status: AC
  Administered 2022-11-07: 5 mg via ORAL
  Filled 2022-11-07: qty 1

## 2022-11-07 MED ORDER — MAGNESIUM SULFATE 2 GM/50ML IV SOLN
2.0000 g | Freq: Once | INTRAVENOUS | Status: AC
  Administered 2022-11-07: 2 g via INTRAVENOUS
  Filled 2022-11-07: qty 50

## 2022-11-07 MED ORDER — AMOXICILLIN-POT CLAVULANATE 875-125 MG PO TABS
1.0000 | ORAL_TABLET | Freq: Two times a day (BID) | ORAL | Status: DC
  Administered 2022-11-07 – 2022-11-10 (×7): 1 via ORAL
  Filled 2022-11-07 (×8): qty 1

## 2022-11-07 MED ORDER — WARFARIN - PHARMACIST DOSING INPATIENT
Freq: Every day | Status: DC
  Administered 2022-11-09: 1

## 2022-11-07 NOTE — Progress Notes (Signed)
  Subjective:  Patient ID: Donna Hopkins, female    DOB: 17-Jul-1955,  MRN: [id number]  A 67 y.o. female presents with left submetatarsal ulceration.  Patient states that she is doing okay denies any other acute complaints no nausea fever chills vomiting.  CT scan was completed Objective:   Vitals:   11/07/22 0400 11/07/22 0821  BP:  132/80  Pulse:  80  Resp:  17  Temp:  99 F (37.2 C)  SpO2: 100% 98%   General AA&O x3. Normal mood and affect.  Vascular Dorsalis pedis and posterior tibial pulses 2/4 bilat. Brisk capillary refill to all digits. Pedal hair present.  Neurologic Epicritic sensation grossly intact.  Dermatologic Full-thickness ulcer submetatarsal 1 on the left foot with surrounding hyperkeratosis, fibrogranular wound bed. No purulent drainage. Cellulitis has significantly improved since last week   Orthopedic: MMT 5/5 in dorsiflexion, plantarflexion, inversion, and eversion. Normal joint ROM without pain or crepitus.    Assessment & Plan:  Patient was evaluated and treated and all questions answered.  Left submetatarsal 1 ulceration 1 -All questions and concerns were discussed with the patient in extensive detail -CT scan was negative for osteomyelitis -Patient can follow-up with podiatry 1 week from discharge -Betadine wet-to-dry dressing -No surgical or plan indicated at this time. -Weightbearing as tolerated -Podiatry to sign off  Candelaria Stagers, DPM  Accessible via secure chat for questions or concerns.

## 2022-11-07 NOTE — Progress Notes (Signed)
ANTICOAGULATION CONSULT NOTE - Initial Consult  Pharmacy Consult for warfarin Indication: mechanical AVR  Allergies  Allergen Reactions   Canagliflozin Shortness Of Breath   Metformin Diarrhea    Other Reaction(s): Diarrhea   Tetanus Toxoids     Infection at injection site    Patient Measurements: Weight: 100 kg (220 lb 6.4 oz)   Vital Signs: Temp: 98.1 F (36.7 C) (08/28 1123) Temp Source: Axillary (08/28 1123) BP: 112/80 (08/28 1123) Pulse Rate: 85 (08/28 1123)  Labs: Recent Labs    11/05/22 0419 11/06/22 0457 11/07/22 1024  HGB 9.7* 9.9* 9.7*  HCT 31.4* 33.1* 31.5*  PLT 263 324 310  LABPROT 44.6* 37.4* 24.3*  INR 4.7* 3.8* 2.2*  CREATININE 0.86 0.82 0.74    Estimated Creatinine Clearance: 83.5 mL/min (by C-G formula based on SCr of 0.74 mg/dL).   Medical History: Past Medical History:  Diagnosis Date   Carotid stenosis, asymptomatic, bilateral    Chronic kidney disease, stage III (moderate) (HCC)    Chronic low back pain    Clinical systolic heart failure, chronic (HCC)    Depression    Diabetes mellitus    Eosinophilic gastritis    GERD (gastroesophageal reflux disease)    Gout, renal disease    H/O mechanical aortic valve replacement    on coumadin   Hx of aortic valve replacement    Hypertension    Hypocalcemia    Hyponatremia    ICD (implantable cardioverter-defibrillator) in place    Medication refill    Mixed hyperlipidemia    Neuropathy    Obesity    OP (osteoporosis)    Peripheral vascular disease, unspecified (HCC)    Persistent insomnia    Personal history of colonic polyps    Pulmonary hypertension (HCC)    Snoring    declines sleep study   Tricuspid regurgitation    Uncontrolled type 2 diabetes mellitus with complication    Valgus foot    Vertigo, benign positional    Vitamin D deficiency     Assessment: 67 yo female on chronic Coumadin for mechanical AVR.  INR goal a bit unclear.  From 2018 records appears to be 2-2.5.   Attempted to call her cardiologist at [facility] to clarify, awaiting return call from RN.  PTA taking Coumadin 7.5 mg daily, although patient says dose adjusted frequently.  Goal of Therapy:  Heparin level 0.3-0.7 units/ml Monitor platelets by anticoagulation protocol: Yes   Plan:  Resume Coumadin with 5 mg x 1 tonight. Daily INR.  Reece Leader, Colon Flattery, BCCP Clinical Pharmacist  11/07/2022 3:24 PM   [facility] pharmacy phone numbers are listed on [email or web address]

## 2022-11-07 NOTE — Progress Notes (Signed)
PROGRESS NOTE    [name]  ZOX:[id number] DOB: November 17, 1955 DOA: 11/03/2022 PCP: Dennard Schaumann, MD    Brief Narrative:   Donna Hopkins is a 67 y.o. female with past medical history significant for mechanical aortic valve repair on Coumadin, carotid stenosis, chronic systolic congestive heart failure s/p CRT-D, type 2 diabetes mellitus, HTN, HLD, CKD stage IIIa, pulmonary HTN, depression, chronic left foot ulcer currently on oral antibiotics per podiatry who presented to [facility] ED on 8/24 from home following fall with reported striking her head with a large hematoma to the left side of her head.  Denies loss of consciousness but currently on anticoagulation with Coumadin.  [name]  Reports associated headache, blurred vision and vomiting.  Denies weakness, no dizziness, no shortness of breath, no nausea/vomiting/diarrhea, no abdominal pain, no urinary symptoms, no changes in mental status.  Notes left foot ulcer currently being treated by podiatry, on Augmentin since 8/20.  In the ED, temperature 98.0 F, HR 120, RR 18, BP 155/79, SpO2 97% on room air.  WBC 11.7, hemoglobin 11.7, platelet count 372.  Sodium 133, potassium 3.2, chloride 94, CO2 26, glucose 195, BUN 10, creatinine 1.19.  AST 25, ALT 18, total bilirubin 1.1.  Lactic acid 2.8.  Urinalysis unrevealing.  Chest x-ray with no acute chest findings.CT head with large left scalp hematoma and parietal region near the vertex with areas of subarachnoid hemorrhage in the basilar cisterns and on the left near the vertex, small subdural component noted on the left near the vertex.  CT cervical spine without acute abnormality.  Patient received doxepin, Flagyl, vancomycin, lactated Ringer's.  Neurosurgery was consulted; and did not feel indication for reversal of her Coumadin at this point.  TRH consulted for admission for further evaluation and management of SAH/SDH.  Assessment &  Plan:   Subarachnoid hemorrhage Subdural hematoma Scalp hematoma Patient presenting to the ED with left sided hematoma and scalp pain.  CT head with large left scalp hematoma and parietal region near the vertex with areas of subarachnoid hemorrhage in the basilar cisterns and on the left near the vertex, small subdural component noted on the left near the vertex.  CT cervical spine without acute abnormality.  Seen by neurosurgery with repeat head CT on 8/26 showing no further progression of subdural/subarachnoid hemorrhage.  Neurosurgery recommended holding Coumadin x 3 days then may resume. -- Will resume Coumadin today -- Continue PT/OT efforts  Left foot diabetic infection Patient follow-up with podiatry outpatient, [name].  Currently on Augmentin outpatient.  CT foot with soft tissue swelling/ulceration plantar aspect first metatarsal head with no fluid collection or signs of osteomyelitis.  Seen by podiatry during hospitalization with no surgical needs at this time.  Continue Betadine wet-to-dry dressings, weightbearing as tolerated.  Outpatient follow-up with podiatry 1 week after discharge.  Ventricular tachycardia Upon ED presentation, patient noted to have a heart rate in 130s, likely secondary to her structural heart disease, CHF.  Seen by cardiology, electrophysiology during hospitalization.  Not a candidate for invasive coronary evaluation given subdural/subarachnoid hemorrhage.  TTE 11/05/2022 with LVEF 60 to 65%, grade 1 diastolic dysfunction with moderate LVH. -- Amiodarone 400 mg BID x 5 days; followed by 200mg  PO BID x 5 days, followed by 200mg  PO daily -- Outpatient follow-up with cardiology  Chronic systolic congestive heart failure s/p CRT-D -- Carvedilol 25 mg p.o. twice daily -- Ramipril 20 mg p.o. daily -- Furosemide 80 mg p.o. daily --  1200 mL fluid restriction -- Strict I's and O's Daily weights  Type 2 diabetes mellitus -- Levemir 40 units subcutaneously twice  daily -- Moderate SSI for coverage -- CBG before every meal/at bedtime  Mechanical aortic valve on anticoagulation Supratherapeutic INR --Resume Coumadin, pharmacy consulted for dosing/monitoring -- INR daily  GERD -- Protonix 40 mg p.o. daily  HLD: -- Atorvastatin 10 mg p.o. daily  Depression -- Sertraline 100 mg p.o. daily  Obese Body mass index is 32.55 kg/m.  Complicates all facets of care   DVT prophylaxis: SCDs Start: 11/04/22 9485 warfarin (COUMADIN) tablet 5 mg    Code Status: Full Code Family Communication:   Disposition Plan:  Level of care: Telemetry Cardiac Status is: Inpatient Remains inpatient appropriate because: Resuming Coumadin today, anticipate discharge home with home health likely tomorrow    Consultants:  Neurosurgery Cardiology/electrophysiology Podiatry  Procedures:  TTE  Antimicrobials:  Vancomycin 8/24 - 8/27 Metronidazole 8/24 - 8/28 Cefepime 8/24 - 8/27 Ceftriaxone 8/27 - 8/27 Bactrim 8/28>>   Subjective: Patient seen examined at bedside, resting comfortably.  Family present at bedside.  Complaining of irritation to eye, when asked if she has blurred vision, she denies.  Unable to tell me any other complaints.  Awaiting labs to be drawn this morning.  INR remains supratherapeutic yesterday.  Seen by cardiology and podiatry this morning and now signed off.  No other specific questions or concerns at this time.  Denies headache, no dizziness, no blurred vision, no fever/chills/night sweats, no nausea/vomiting/diarrhea, no chest pain, no palpitations, no shortness of breath, no abdominal pain, no focal weakness, no fatigue, no paresthesias.  No acute events overnight per nursing.  Objective: Vitals:   11/07/22 0343 11/07/22 0400 11/07/22 0821 11/07/22 1123  BP: 139/70  132/80 112/80  Pulse: 76  80 85  Resp: 16  17 16   Temp: 98.8 F (37.1 C)  99 F (37.2 C) 98.1 F (36.7 C)  TempSrc: Oral  Oral Axillary  SpO2: 100% 100% 98% 100%   Weight:        Intake/Output Summary (Last 24 hours) at 11/07/2022 1539 Last data filed at 11/07/2022 1300 Gross per 24 hour  Intake --  Output 315 ml  Net -315 ml   Filed Weights   11/04/22 0623 11/05/22 0508 11/06/22 0349  Weight: 100.1 kg 94.7 kg 100 kg    Examination:  Physical Exam: GEN: NAD, alert and oriented x 3, obese HEENT: NCAT, PERRL, EOMI, sclera clear, MMM PULM: CTAB w/o wheezes/crackles, normal respiratory effort, on room air CV: RRR w/o M/G/R GI: abd soft, NTND, + BS MSK: no peripheral edema, moves all EXTR independently with preserved strength NEURO: CN II-XII intact, no focal deficits, sensation to light touch intact PSYCH: normal mood/affect    Data Reviewed: I have personally reviewed following labs and imaging studies  CBC: Recent Labs  Lab 11/03/22 2116 11/05/22 0419 11/06/22 0457 11/07/22 1024  WBC 11.7* 7.7 7.3 6.5  NEUTROABS 8.0*  --  4.1 4.2  HGB 11.7* 9.7* 9.9* 9.7*  HCT 36.8 31.4* 33.1* 31.5*  MCV 89.8 92.9 94.8 92.6  PLT 372 263 324 310   Basic Metabolic Panel: Recent Labs  Lab 11/03/22 2116 11/04/22 0654 11/05/22 0419 11/06/22 0457 11/07/22 1024  NA 133* 132* 132* 134* 134*  K 3.2* 3.2* 3.8 3.8 3.7  CL 94* 96* 97* 98 99  CO2 26 23 24 24 22   GLUCOSE 195* 156* 124* 104* 127*  BUN 10 11 12 13 11   CREATININE 1.19*  0.90 0.86 0.82 0.74  CALCIUM 9.1 8.2* 8.0* 8.4* 8.5*  MG  --  1.4* 2.2 2.0 1.7   GFR: Estimated Creatinine Clearance: 83.5 mL/min (by C-G formula based on SCr of 0.74 mg/dL). Liver Function Tests: Recent Labs  Lab 11/03/22 2116 11/04/22 0942 11/05/22 0419  AST 25 17 19   ALT 18 13 11   ALKPHOS 103 78 79  BILITOT 1.1 0.9 1.0  PROT 8.4* 6.5 7.1  ALBUMIN 3.5 2.6* 2.8*   No results for input(s): "LIPASE", "AMYLASE" in the last 168 hours. No results for input(s): "AMMONIA" in the last 168 hours. Coagulation Profile: Recent Labs  Lab 11/03/22 2116 11/05/22 0419 11/06/22 0457 11/07/22 1024  INR 4.1* 4.7*  3.8* 2.2*   Cardiac Enzymes: Recent Labs  Lab 11/03/22 2116  CKTOTAL 98   BNP (last 3 results) No results for input(s): "PROBNP" in the last 8760 hours. HbA1C: No results for input(s): "HGBA1C" in the last 72 hours. CBG: Recent Labs  Lab 11/06/22 1135 11/06/22 1532 11/06/22 2208 11/07/22 0746 11/07/22 1154  GLUCAP 226* 114* 136* 126* 146*   Lipid Profile: No results for input(s): "CHOL", "HDL", "LDLCALC", "TRIG", "CHOLHDL", "LDLDIRECT" in the last 72 hours. Thyroid Function Tests: No results for input(s): "TSH", "T4TOTAL", "FREET4", "T3FREE", "THYROIDAB" in the last 72 hours. Anemia Panel: No results for input(s): "VITAMINB12", "FOLATE", "FERRITIN", "TIBC", "IRON", "RETICCTPCT" in the last 72 hours. Sepsis Labs: Recent Labs  Lab 11/03/22 2128 11/03/22 2301 11/04/22 0246 11/04/22 0428  LATICACIDVEN 2.8* 3.1* 2.2* 1.8    Recent Results (from the past 240 hour(s))  Culture, blood (Routine x 2)     Status: None (Preliminary result)   Collection Time: 11/03/22  9:12 PM   Specimen: BLOOD  Result Value Ref Range Status   Specimen Description BLOOD BLOOD RIGHT ARM  Final   Special Requests   Final    BOTTLES DRAWN AEROBIC ONLY Blood Culture adequate volume   Culture   Final    NO GROWTH 4 DAYS Performed at [facility] Lab, [address] [place], [place] [address]    Report Status PENDING  Incomplete  Culture, blood (Routine x 2)     Status: None (Preliminary result)   Collection Time: 11/03/22  9:16 PM   Specimen: BLOOD  Result Value Ref Range Status   Specimen Description BLOOD BLOOD LEFT ARM  Final   Special Requests   Final    BOTTLES DRAWN AEROBIC AND ANAEROBIC Blood Culture adequate volume   Culture   Final    NO GROWTH 4 DAYS Performed at [facility], [address], [place], [place] [address]    Report Status PENDING  Incomplete  Aerobic/Anaerobic Culture w Gram Stain (surgical/deep wound)     Status: None (Preliminary result)   Collection  Time: 11/04/22  6:01 AM   Specimen: Other Source; Wound  Result Value Ref Range Status   Specimen Description OTHER  Final   Special Requests Foot, Left  Final   Gram Stain NO WBC SEEN NO ORGANISMS SEEN   Final   Culture   Final    NO GROWTH 2 DAYS NO ANAEROBES ISOLATED; CULTURE IN PROGRESS FOR 5 DAYS Performed at [facility], [address] [place], [place] [address]    Report Status PENDING  Incomplete         Radiology Studies: ECHOCARDIOGRAM COMPLETE  Result Date: 11/05/2022    ECHOCARDIOGRAM REPORT   Patient Name:   Donna Hopkins Date of Exam: 11/05/2022 Medical  Rec #:  [id number]     Height:       69.0 in Accession #:    [id number]    Weight:       208.8 lb Date of Birth:  03-18-1955     BSA:          2.104 m Patient Age:    67 years      BP:           121/85 mmHg Patient Gender: F             HR:           78 bpm. Exam Location:  Inpatient Procedure: 2D Echo, Color Doppler, Cardiac Doppler and Intracardiac            Opacification Agent Indications:    Aortic Valve Disease  History:        Patient has prior history of Echocardiogram examinations, most                 recent 02/04/2018. CHF, SAH, Signs/Symptoms:Sepsis; Risk                 Factors:Diabetes.                 Aortic Valve: mechanical valve is present in the aortic                 position.  Sonographer:    Milbert Coulter Referring Phys: [id number] Rossie Muskrat LAMBERT IMPRESSIONS  1. Left ventricular ejection fraction, by estimation, is 60 to 65%. The left ventricle has normal function. The left ventricle has no regional wall motion abnormalities. There is moderate concentric left ventricular hypertrophy. Left ventricular diastolic parameters are consistent with Grade I diastolic dysfunction (impaired relaxation).  2. Right ventricular systolic function is normal. The right ventricular size is normal. Tricuspid regurgitation signal is inadequate for assessing PA pressure.  3. The mitral valve is normal in structure. No  evidence of mitral valve regurgitation. No evidence of mitral stenosis.  4. The aortic valve was not well visualized. Aortic valve regurgitation is not visualized. No aortic stenosis is present. There is a mechanical valve present in the aortic position. Aortic valve area, by VTI measures 1.75 cm. Aortic valve mean gradient  measures 19.7 mmHg. Aortic valve Vmax measures 3.07 m/s. DVI stable at 0.36. Normal functioning prosthesis. No significant change in mean AVG, DVI or Vmax.  5. The inferior vena cava is normal in size with greater than 50% respiratory variability, suggesting right atrial pressure of 3 mmHg.  6. Compared to study dated 06/06/2021, there does not appear to be any significant perivalvular leak of the mechanical AVR. FINDINGS  Left Ventricle: Left ventricular ejection fraction, by estimation, is 60 to 65%. The left ventricle has normal function. The left ventricle has no regional wall motion abnormalities. Definity contrast agent was given IV to delineate the left ventricular  endocardial borders. The left ventricular internal cavity size was normal in size. There is moderate concentric left ventricular hypertrophy. Left ventricular diastolic parameters are consistent with Grade I diastolic dysfunction (impaired relaxation). Normal left ventricular filling pressure. Right Ventricle: The right ventricular size is normal. No increase in right ventricular wall thickness. Right ventricular systolic function is normal. Tricuspid regurgitation signal is inadequate for assessing PA pressure. Left Atrium: Left atrial size was normal in size. Right Atrium: Right atrial size was normal in size. Pericardium: There is no evidence of pericardial effusion. Mitral Valve: The mitral valve is normal in structure.  No evidence of mitral valve regurgitation. No evidence of mitral valve stenosis. Tricuspid Valve: The tricuspid valve is normal in structure. Tricuspid valve regurgitation is not demonstrated. No evidence  of tricuspid stenosis. Aortic Valve: The aortic valve was not well visualized. Aortic valve regurgitation is not visualized. No aortic stenosis is present. Aortic valve mean gradient measures 19.7 mmHg. Aortic valve peak gradient measures 37.6 mmHg. Aortic valve area, by VTI measures 1.75 cm. There is a mechanical valve present in the aortic position. Pulmonic Valve: The pulmonic valve was normal in structure. Pulmonic valve regurgitation is not visualized. No evidence of pulmonic stenosis. Aorta: The aortic root is normal in size and structure. Venous: The inferior vena cava is normal in size with greater than 50% respiratory variability, suggesting right atrial pressure of 3 mmHg. IAS/Shunts: No atrial level shunt detected by color flow Doppler. Additional Comments: A device lead is visualized.  LEFT VENTRICLE PLAX 2D LVIDd:         4.10 cm   Diastology LVIDs:         2.80 cm   LV e' medial:    5.22 cm/s LV PW:         1.40 cm   LV E/e' medial:  16.4 LV IVS:        1.50 cm   LV e' lateral:   8.70 cm/s LVOT diam:     2.50 cm   LV E/e' lateral: 9.8 LV SV:         99 LV SV Index:   47 LVOT Area:     4.91 cm  RIGHT VENTRICLE RV Basal diam:  2.90 cm RV Mid diam:    2.50 cm RV S prime:     8.49 cm/s TAPSE (M-mode): 2.2 cm LEFT ATRIUM             Index        RIGHT ATRIUM           Index LA Vol (A2C):   36.2 ml 17.20 ml/m  RA Area:     12.70 cm LA Vol (A4C):   38.5 ml 18.30 ml/m  RA Volume:   29.70 ml  14.11 ml/m LA Biplane Vol: 37.8 ml 17.96 ml/m  AORTIC VALVE AV Area (Vmax):    1.84 cm AV Area (Vmean):   1.74 cm AV Area (VTI):     1.75 cm AV Vmax:           306.67 cm/s AV Vmean:          202.667 cm/s AV VTI:            0.564 m AV Peak Grad:      37.6 mmHg AV Mean Grad:      19.7 mmHg LVOT Vmax:         115.00 cm/s LVOT Vmean:        71.800 cm/s LVOT VTI:          0.201 m LVOT/AV VTI ratio: 0.36 MITRAL VALVE MV Area (PHT): 3.12 cm     SHUNTS MV Decel Time: 243 msec     Systemic VTI:  0.20 m MV E velocity:  85.40 cm/s   Systemic Diam: 2.50 cm MV A velocity: 118.00 cm/s MV E/A ratio:  0.72 Armanda Magic MD Electronically signed by Armanda Magic MD Signature Date/Time: 11/05/2022/5:41:11 PM    Final         Scheduled Meds:  [START ON 11/11/2022] amiodarone  200 mg Oral BID   amiodarone  400  mg Oral BID   amoxicillin-clavulanate  1 tablet Oral Q12H   atorvastatin  10 mg Oral Daily   carvedilol  25 mg Oral BID WC   feeding supplement  237 mL Oral BID BM   ferrous sulfate  325 mg Oral Q breakfast   furosemide  80 mg Oral Daily   gabapentin  300 mg Oral TID   insulin aspart  0-15 Units Subcutaneous TID WC   insulin aspart  0-5 Units Subcutaneous QHS   insulin detemir  40 Units Subcutaneous BID   pantoprazole  40 mg Oral Daily   pneumococcal 20-valent conjugate vaccine  0.5 mL Intramuscular Tomorrow-1000   ramipril  20 mg Oral Daily   sertraline  100 mg Oral Daily   sodium chloride flush  3 mL Intravenous Q12H   warfarin  5 mg Oral ONCE-1600   Warfarin - Pharmacist Dosing Inpatient   Does not apply q1600   Continuous Infusions:   LOS: 3 days    Time spent: 52 minutes spent on chart review, discussion with nursing staff, consultants, updating family and interview/physical exam; more than 50% of that time was spent in counseling and/or coordination of care.    Alvira Philips [place], DO [facility] Available via Epic secure chat 7am-7pm After these hours, please refer to coverage provider listed on [email or web address] 11/07/2022, 3:39 PM .

## 2022-11-08 DIAGNOSIS — A419 Sepsis, unspecified organism: Secondary | ICD-10-CM | POA: Diagnosis not present

## 2022-11-08 DIAGNOSIS — L0390 Cellulitis, unspecified: Secondary | ICD-10-CM | POA: Diagnosis not present

## 2022-11-08 LAB — BASIC METABOLIC PANEL
Anion gap: 9 (ref 5–15)
BUN: 15 mg/dL (ref 8–23)
CO2: 26 mmol/L (ref 22–32)
Calcium: 8.5 mg/dL — ABNORMAL LOW (ref 8.9–10.3)
Chloride: 97 mmol/L — ABNORMAL LOW (ref 98–111)
Creatinine, Ser: 0.86 mg/dL (ref 0.44–1.00)
GFR, Estimated: >60 mL/min (ref >60)
Glucose, Bld: 140 mg/dL — ABNORMAL HIGH (ref 70–99)
Potassium: 3.4 mmol/L — ABNORMAL LOW (ref 3.5–5.1)
Sodium: 132 mmol/L — ABNORMAL LOW (ref 135–145)

## 2022-11-08 LAB — CULTURE, BLOOD (ROUTINE X 2)
Culture: NO GROWTH
Culture: NO GROWTH
Special Requests: ADEQUATE
Special Requests: ADEQUATE

## 2022-11-08 LAB — GLUCOSE, CAPILLARY
Glucose-Capillary: 105 mg/dL — ABNORMAL HIGH (ref 70–99)
Glucose-Capillary: 185 mg/dL — ABNORMAL HIGH (ref 70–99)
Glucose-Capillary: 184 mg/dL — ABNORMAL HIGH (ref 70–99)
Glucose-Capillary: 247 mg/dL — ABNORMAL HIGH (ref 70–99)

## 2022-11-08 LAB — PROTIME-INR
INR: 1.9 — ABNORMAL HIGH (ref 0.8–1.2)
Prothrombin Time: 21.8 s — ABNORMAL HIGH (ref 11.4–15.2)

## 2022-11-08 LAB — MAGNESIUM: Magnesium: 2 mg/dL (ref 1.7–2.4)

## 2022-11-08 MED ORDER — POTASSIUM CHLORIDE CRYS ER 20 MEQ PO TBCR
30.0000 meq | EXTENDED_RELEASE_TABLET | ORAL | Status: AC
  Administered 2022-11-08 (×2): 30 meq via ORAL
  Filled 2022-11-08 (×2): qty 1

## 2022-11-08 MED ORDER — WARFARIN SODIUM 7.5 MG PO TABS
7.5000 mg | ORAL_TABLET | Freq: Once | ORAL | Status: AC
  Administered 2022-11-08: 7.5 mg via ORAL
  Filled 2022-11-08: qty 1

## 2022-11-08 NOTE — Progress Notes (Signed)
Physical Therapy Treatment Patient Details Name: Donna Hopkins MRN: [id number] DOB: 1955-03-20 Today's Date: 11/08/2022   History of Present Illness 67 yo female admitted 8/24 with fall, hitting her head on wall with small Lt SDH, sepsis, cellulitis. PMHx: AVR, carotid artery stenosis, CKD 3, chronic systolic heart failure, CRT-D, HTN, HLD, pulmonary hypertension.    PT Comments  Pt pleasant, telling jokes throughout session. Pt reporting L thigh discomfort, but otherwise no complaints. Pt mobilizing in hallway with very increased time and cues for form/safety throughout. Pt attempted gait without AD since she did not use AD PTA, but cannot safely walk without support at this time given pain and weakness. Plan remains appropriate, VSS throughout.     If plan is discharge home, recommend the following: A little help with bathing/dressing/bathroom;Assistance with cooking/housework;Direct supervision/assist for medications management;Assist for transportation;Supervision due to cognitive status;Direct supervision/assist for financial management;Help with stairs or ramp for entrance   Can travel by private vehicle        Equipment Recommendations  None recommended by PT    Recommendations for Other Services       Precautions / Restrictions Precautions Precautions: Fall Precaution Comments: pt reports diplopia at times Restrictions Weight Bearing Restrictions: No     Mobility  Bed Mobility               General bed mobility comments: up in chair    Transfers Overall transfer level: Needs assistance Equipment used: None Transfers: Sit to/from Stand Sit to Stand: Supervision           General transfer comment: for safety, x2 from recliner and toilet    Ambulation/Gait Ambulation/Gait assistance: Contact guard assist Gait Distance (Feet): 120 Feet Assistive device: Rolling walker (2 wheels) Gait Pattern/deviations: Step-through pattern, Decreased stride length Gait  velocity: decr     General Gait Details: cues for placement in RW and hallway navigation   Stairs             Wheelchair Mobility     Tilt Bed    Modified Rankin (Stroke Patients Only)       Balance Overall balance assessment: Needs assistance, History of Falls Sitting-balance support: Feet supported Sitting balance-Leahy Scale: Fair     Standing balance support: No upper extremity supported, During functional activity Standing balance-Leahy Scale: Fair Standing balance comment: can stand statically without UE support, requires AD for safe gait                            Cognition Arousal: Alert Behavior During Therapy: WFL for tasks assessed/performed, Impulsive Overall Cognitive Status: Impaired/Different from baseline Area of Impairment: Safety/judgement, Problem solving, Memory, Awareness                   Current Attention Level: Sustained Memory: Decreased short-term memory Following Commands: Follows one step commands inconsistently, Follows one step commands with increased time Safety/Judgement: Decreased awareness of safety, Decreased awareness of deficits Awareness: Emergent   General Comments: safety cues needed with RW and in hallway to avoid running into obstacles        Exercises      General Comments General comments (skin integrity, edema, etc.): VSS on RA      Pertinent Vitals/Pain Pain Assessment Pain Assessment: Faces Faces Pain Scale: Hurts little more Pain Location: L hip Pain Descriptors / Indicators: Discomfort, Grimacing Pain Intervention(s): Limited activity within patient's tolerance, Monitored during session, Repositioned    Home Living  Prior Function            PT Goals (current goals can now be found in the care plan section) Acute Rehab PT Goals Patient Stated Goal: return home PT Goal Formulation: With patient Time For Goal Achievement: 11/19/22 Potential  to Achieve Goals: Fair Progress towards PT goals: Progressing toward goals    Frequency    Min 1X/week      PT Plan      Co-evaluation              AM-PAC PT "6 Clicks" Mobility   Outcome Measure  Help needed turning from your back to your side while in a flat bed without using bedrails?: None Help needed moving from lying on your back to sitting on the side of a flat bed without using bedrails?: None Help needed moving to and from a bed to a chair (including a wheelchair)?: A Little Help needed standing up from a chair using your arms (e.g., wheelchair or bedside chair)?: A Little Help needed to walk in hospital room?: A Little Help needed climbing 3-5 steps with a railing? : A Little 6 Click Score: 20    End of Session Equipment Utilized During Treatment: Gait belt Activity Tolerance: Patient tolerated treatment well Patient left: in chair;with call bell/phone within reach;with chair alarm set Nurse Communication: Mobility status PT Visit Diagnosis: History of falling (Z91.81);Muscle weakness (generalized) (M62.81);Unsteadiness on feet (R26.81)     Time: 1610-9604 PT Time Calculation (min) (ACUTE ONLY): 29 min  Charges:    $Gait Training: 8-22 mins $Therapeutic Activity: 8-22 mins PT General Charges $$ ACUTE PT VISIT: 1 Visit                     Marye Round, PT DPT Acute Rehabilitation Services Secure Chat Preferred  Office [phone]    Donna Hopkins E Christain [place] 11/08/2022, 11:26 AM

## 2022-11-08 NOTE — Care Management Important Message (Signed)
Important Message  Patient Details  Name: Donna Hopkins MRN: [id number] Date of Birth: 07-Aug-1955   Medicare Important Message Given:  Yes     Renie Ora 11/08/2022, 8:02 AM

## 2022-11-08 NOTE — Progress Notes (Signed)
PROGRESS NOTE    Donna Hopkins  YQM:[id number] DOB: 12/21/1955 DOA: 11/03/2022 PCP: Dennard Schaumann, MD    Brief Narrative:   Donna Hopkins is a 67 y.o. female with past medical history significant for mechanical aortic valve repair on Coumadin, carotid stenosis, chronic systolic congestive heart failure s/p CRT-D, type 2 diabetes mellitus, HTN, HLD, CKD stage IIIa, pulmonary HTN, depression, chronic left foot ulcer currently on oral antibiotics per podiatry who presented to [facility] ED on 8/24 from home following fall with reported striking her head with a large hematoma to the left side of her head.  Denies loss of consciousness but currently on anticoagulation with Coumadin.  [name]  Reports associated headache, blurred vision and vomiting.  Denies weakness, no dizziness, no shortness of breath, no nausea/vomiting/diarrhea, no abdominal pain, no urinary symptoms, no changes in mental status.  Notes left foot ulcer currently being treated by podiatry, on Augmentin since 8/20.  In the ED, temperature 98.0 F, HR 120, RR 18, BP 155/79, SpO2 97% on room air.  WBC 11.7, hemoglobin 11.7, platelet count 372.  Sodium 133, potassium 3.2, chloride 94, CO2 26, glucose 195, BUN 10, creatinine 1.19.  AST 25, ALT 18, total bilirubin 1.1.  Lactic acid 2.8.  Urinalysis unrevealing.  Chest x-ray with no acute chest findings.CT head with large left scalp hematoma and parietal region near the vertex with areas of subarachnoid hemorrhage in the basilar cisterns and on the left near the vertex, small subdural component noted on the left near the vertex.  CT cervical spine without acute abnormality.  Patient received doxepin, Flagyl, vancomycin, lactated Ringer's.  Neurosurgery was consulted; and did not feel indication for reversal of her Coumadin at this point.  TRH consulted for admission for further evaluation and management of SAH/SDH.  Assessment &  Plan:   Subarachnoid hemorrhage Subdural hematoma Scalp hematoma Patient presenting to the ED with left sided hematoma and scalp pain.  CT head with large left scalp hematoma and parietal region near the vertex with areas of subarachnoid hemorrhage in the basilar cisterns and on the left near the vertex, small subdural component noted on the left near the vertex.  CT cervical spine without acute abnormality.  Seen by neurosurgery with repeat head CT on 8/26 showing no further progression of subdural/subarachnoid hemorrhage.  Neurosurgery recommended holding Coumadin x 3 days then may resume. -- Continue PT/OT efforts  Left foot diabetic infection Patient follow-up with podiatry outpatient, [name].  Currently on Augmentin outpatient.  CT foot with soft tissue swelling/ulceration plantar aspect first metatarsal head with no fluid collection or signs of osteomyelitis.  Seen by podiatry during hospitalization with no surgical needs at this time.  Continue Betadine wet-to-dry dressings, weightbearing as tolerated.  Outpatient follow-up with podiatry 1 week after discharge.  Ventricular tachycardia Upon ED presentation, patient noted to have a heart rate in 130s, likely secondary to her structural heart disease, CHF.  Seen by cardiology, electrophysiology during hospitalization.  Not a candidate for invasive coronary evaluation given subdural/subarachnoid hemorrhage.  TTE 11/05/2022 with LVEF 60 to 65%, grade 1 diastolic dysfunction with moderate LVH. -- Amiodarone 400 mg BID x 5 days; followed by 200mg  PO BID x 5 days, followed by 200mg  PO daily -- Outpatient follow-up with cardiology  Chronic systolic congestive heart failure s/p CRT-D -- Carvedilol 25 mg p.o. twice daily -- Ramipril 20 mg p.o. daily -- Furosemide 80 mg p.o. daily -- 1200 mL fluid restriction --  Strict I's and O's Daily weights  Type 2 diabetes mellitus -- Levemir 40 units subcutaneously twice daily -- Moderate SSI for  coverage -- CBG before every meal/at bedtime  Mechanical aortic valve on anticoagulation Supratherapeutic INR --Resume Coumadin, pharmacy consulted for dosing/monitoring -- INR daily; 1.9 today  GERD -- Protonix 40 mg p.o. daily  HLD: -- Atorvastatin 10 mg p.o. daily  Depression -- Sertraline 100 mg p.o. daily  Obese Body mass index is 32.59 kg/m.  Complicates all facets of care   DVT prophylaxis: SCDs Start: 11/04/22 7846 warfarin (COUMADIN) tablet 7.5 mg    Code Status: Full Code Family Communication:   Disposition Plan:  Level of care: Telemetry Cardiac Status is: Inpatient Remains inpatient appropriate because: INR remains subtherapeutic, if therapeutic tomorrow anticipate discharge home with home health.    Consultants:  Neurosurgery Cardiology/electrophysiology Podiatry  Procedures:  TTE  Antimicrobials:  Vancomycin 8/24 - 8/27 Metronidazole 8/24 - 8/28 Cefepime 8/24 - 8/27 Ceftriaxone 8/27 - 8/27 Bactrim 8/28>>   Subjective: Patient seen examined at bedside, resting comfortably.  Sitting in bedside chair eating breakfast.  No specific complaints this morning.  INR down to 1.9, restarted Coumadin yesterday.  Discussed with patient would like her to be in the therapeutic range before discharge home, hopefully tomorrow.  Discussed with pharmacist. No other specific questions or concerns at this time.  Denies headache, no dizziness, no blurred vision, no fever/chills/night sweats, no nausea/vomiting/diarrhea, no chest pain, no palpitations, no shortness of breath, no abdominal pain, no focal weakness, no fatigue, no paresthesias.  No acute events overnight per nursing.  Objective: Vitals:   11/08/22 0322 11/08/22 0323 11/08/22 0500 11/08/22 0800  BP: 133/64 133/64  129/72  Pulse: 76 78  82  Resp: 16 19    Temp: 98.8 F (37.1 C) 98.8 F (37.1 C)  98.2 F (36.8 C)  TempSrc: Oral Oral  Oral  SpO2: 90% 96%    Weight:   100.1 kg     Intake/Output  Summary (Last 24 hours) at 11/08/2022 1221 Last data filed at 11/08/2022 0900 Gross per 24 hour  Intake 540 ml  Output 1665 ml  Net -1125 ml   Filed Weights   11/05/22 0508 11/06/22 0349 11/08/22 0500  Weight: 94.7 kg 100 kg 100.1 kg    Examination:  Physical Exam: GEN: NAD, alert and oriented x 3, obese HEENT: NCAT, PERRL, EOMI, sclera clear, MMM PULM: CTAB w/o wheezes/crackles, normal respiratory effort, on room air CV: RRR w/o M/G/R GI: abd soft, NTND, + BS MSK: no peripheral edema, moves all EXTR independently with preserved strength NEURO: CN II-XII intact, no focal deficits, sensation to light touch intact PSYCH: normal mood/affect    Data Reviewed: I have personally reviewed following labs and imaging studies  CBC: Recent Labs  Lab 11/03/22 2116 11/05/22 0419 11/06/22 0457 11/07/22 1024  WBC 11.7* 7.7 7.3 6.5  NEUTROABS 8.0*  --  4.1 4.2  HGB 11.7* 9.7* 9.9* 9.7*  HCT 36.8 31.4* 33.1* 31.5*  MCV 89.8 92.9 94.8 92.6  PLT 372 263 324 310   Basic Metabolic Panel: Recent Labs  Lab 11/04/22 0654 11/05/22 0419 11/06/22 0457 11/07/22 1024 11/08/22 0500  NA 132* 132* 134* 134* 132*  K 3.2* 3.8 3.8 3.7 3.4*  CL 96* 97* 98 99 97*  CO2 23 24 24 22 26   GLUCOSE 156* 124* 104* 127* 140*  BUN 11 12 13 11 15   CREATININE 0.90 0.86 0.82 0.74 0.86  CALCIUM 8.2* 8.0* 8.4* 8.5* 8.5*  MG 1.4* 2.2 2.0 1.7 2.0   GFR: Estimated Creatinine Clearance: 77.8 mL/min (by C-G formula based on SCr of 0.86 mg/dL). Liver Function Tests: Recent Labs  Lab 11/03/22 2116 11/04/22 0942 11/05/22 0419  AST 25 17 19   ALT 18 13 11   ALKPHOS 103 78 79  BILITOT 1.1 0.9 1.0  PROT 8.4* 6.5 7.1  ALBUMIN 3.5 2.6* 2.8*   No results for input(s): "LIPASE", "AMYLASE" in the last 168 hours. No results for input(s): "AMMONIA" in the last 168 hours. Coagulation Profile: Recent Labs  Lab 11/03/22 2116 11/05/22 0419 11/06/22 0457 11/07/22 1024 11/08/22 0500  INR 4.1* 4.7* 3.8* 2.2*  1.9*   Cardiac Enzymes: Recent Labs  Lab 11/03/22 2116  CKTOTAL 98   BNP (last 3 results) No results for input(s): "PROBNP" in the last 8760 hours. HbA1C: No results for input(s): "HGBA1C" in the last 72 hours. CBG: Recent Labs  Lab 11/07/22 1154 11/07/22 1602 11/07/22 2126 11/08/22 0728 11/08/22 1218  GLUCAP 146* 210* 198* 105* 185*   Lipid Profile: No results for input(s): "CHOL", "HDL", "LDLCALC", "TRIG", "CHOLHDL", "LDLDIRECT" in the last 72 hours. Thyroid Function Tests: No results for input(s): "TSH", "T4TOTAL", "FREET4", "T3FREE", "THYROIDAB" in the last 72 hours. Anemia Panel: No results for input(s): "VITAMINB12", "FOLATE", "FERRITIN", "TIBC", "IRON", "RETICCTPCT" in the last 72 hours. Sepsis Labs: Recent Labs  Lab 11/03/22 2128 11/03/22 2301 11/04/22 0246 11/04/22 0428  LATICACIDVEN 2.8* 3.1* 2.2* 1.8    Recent Results (from the past 240 hour(s))  Culture, blood (Routine x 2)     Status: None   Collection Time: 11/03/22  9:12 PM   Specimen: BLOOD  Result Value Ref Range Status   Specimen Description BLOOD BLOOD RIGHT ARM  Final   Special Requests   Final    BOTTLES DRAWN AEROBIC ONLY Blood Culture adequate volume   Culture   Final    NO GROWTH 5 DAYS Performed at [facility], [address] [place], [place] [address]    Report Status 11/08/2022 FINAL  Final  Culture, blood (Routine x 2)     Status: None   Collection Time: 11/03/22  9:16 PM   Specimen: BLOOD  Result Value Ref Range Status   Specimen Description BLOOD BLOOD LEFT ARM  Final   Special Requests   Final    BOTTLES DRAWN AEROBIC AND ANAEROBIC Blood Culture adequate volume   Culture   Final    NO GROWTH 5 DAYS Performed at [facility], [address], [place], [place] [address]    Report Status 11/08/2022 FINAL  Final  Aerobic/Anaerobic Culture w Gram Stain (surgical/deep wound)     Status: None (Preliminary result)   Collection Time: 11/04/22  6:01 AM   Specimen:  Other Source; Wound  Result Value Ref Range Status   Specimen Description OTHER  Final   Special Requests Foot, Left  Final   Gram Stain NO WBC SEEN NO ORGANISMS SEEN   Final   Culture   Final    NO GROWTH 3 DAYS NO ANAEROBES ISOLATED; CULTURE IN PROGRESS FOR 5 DAYS Performed at [facility], [address] [place], [place] [address]    Report Status PENDING  Incomplete         Radiology Studies: No results found.      Scheduled Meds:  [START ON 11/11/2022] amiodarone  200 mg Oral BID   amiodarone  400 mg Oral BID   amoxicillin-clavulanate  1 tablet Oral Q12H  atorvastatin  10 mg Oral Daily   carvedilol  25 mg Oral BID WC   feeding supplement  237 mL Oral BID BM   ferrous sulfate  325 mg Oral Q breakfast   furosemide  80 mg Oral Daily   gabapentin  300 mg Oral TID   insulin aspart  0-15 Units Subcutaneous TID WC   insulin aspart  0-5 Units Subcutaneous QHS   insulin detemir  40 Units Subcutaneous BID   pantoprazole  40 mg Oral Daily   pneumococcal 20-valent conjugate vaccine  0.5 mL Intramuscular Tomorrow-1000   potassium chloride  30 mEq Oral Q3H   ramipril  20 mg Oral Daily   sertraline  100 mg Oral Daily   sodium chloride flush  3 mL Intravenous Q12H   warfarin  7.5 mg Oral ONCE-1600   Warfarin - Pharmacist Dosing Inpatient   Does not apply q1600   Continuous Infusions:   LOS: 4 days    Time spent: 52 minutes spent on chart review, discussion with nursing staff, consultants, updating family and interview/physical exam; more than 50% of that time was spent in counseling and/or coordination of care.    Alvira Philips [place], DO [facility] Available via Epic secure chat 7am-7pm After these hours, please refer to coverage provider listed on [email or web address] 11/08/2022, 12:22 PM .

## 2022-11-08 NOTE — Plan of Care (Signed)
  Problem: Health Behavior/Discharge Planning: Goal: Ability to manage health-related needs will improve Outcome: Progressing   Problem: Clinical Measurements: Goal: Ability to maintain clinical measurements within normal limits will improve Outcome: Progressing Goal: Will remain free from infection Outcome: Progressing Goal: Diagnostic test results will improve Outcome: Progressing Goal: Respiratory complications will improve Outcome: Progressing Goal: Cardiovascular complication will be avoided Outcome: Progressing   Problem: Activity: Goal: Risk for activity intolerance will decrease Outcome: Progressing   Problem: Nutrition: Goal: Adequate nutrition will be maintained Outcome: Progressing   Problem: Pain Managment: Goal: General experience of comfort will improve Outcome: Progressing   Problem: Safety: Goal: Ability to remain free from injury will improve Outcome: Progressing   Problem: Skin Integrity: Goal: Risk for impaired skin integrity will decrease Outcome: Progressing   

## 2022-11-08 NOTE — Plan of Care (Signed)

## 2022-11-08 NOTE — Progress Notes (Signed)
Occupational Therapy Treatment Patient Details Name: Donna Hopkins MRN: [id number] DOB: [birth year] Today's Date: 11/08/2022   History of present illness 67 yo female admitted 8/24 with fall, hitting her head on wall with small Lt SDH, sepsis, cellulitis. PMHx: AVR, carotid artery stenosis, CKD 3, chronic systolic heart failure, CRT-D, HTN, HLD, pulmonary hypertension.   OT comments  Patient received in supine and agreeable to OT session. Patient demonstrating good progress with supervision for sit to stand from EOB, toilet hygiene, and LB bathing/dressing while seated. Patient required cues for safety and to stay on task. Patient will benefit from continued inpatient follow up therapy, <3 hours/day to increase independence and safety with self care tasks. Acute OT to continue to follow.       If plan is discharge home, recommend the following:  A little help with walking and/or transfers;Direct supervision/assist for medications management;Direct supervision/assist for financial management;Assist for transportation;Supervision due to cognitive status;Assistance with cooking/housework   Equipment Recommendations  Tub/shower bench    Recommendations for Other Services      Precautions / Restrictions Precautions Precautions: Fall Precaution Comments: pt reports diplopia at times Restrictions Weight Bearing Restrictions: No       Mobility Bed Mobility Overal bed mobility: Modified Independent Bed Mobility: Supine to Sit           General bed mobility comments: able to get to EOB without assistance    Transfers Overall transfer level: Needs assistance Equipment used: Rolling walker (2 wheels) Transfers: Sit to/from Stand             General transfer comment: supervision for sit to stand from EOB and CGA from toilet     Balance Overall balance assessment: Needs assistance, History of Falls Sitting-balance support: Feet supported Sitting balance-Leahy Scale: Fair      Standing balance support: Single extremity supported, Bilateral upper extremity supported, No upper extremity supported, During functional activity Standing balance-Leahy Scale: Poor Standing balance comment: stood at sink with no UE support for hand hygiene                           ADL either performed or assessed with clinical judgement   ADL Overall ADL's : Needs assistance/impaired     Grooming: Wash/dry hands;Wash/dry face;Contact guard assist;Standing       Lower Body Bathing: Supervison/ safety;Sitting/lateral leans Lower Body Bathing Details (indicate cue type and reason): bathed feet     Lower Body Dressing: Supervision/safety;Sitting/lateral leans Lower Body Dressing Details (indicate cue type and reason): chaged socks Toilet Transfer: Contact guard assist;Ambulation;Regular Toilet;Rolling walker (2 wheels);Grab bars   Toileting- Clothing Manipulation and Hygiene: Supervision/safety;Sitting/lateral lean         General ADL Comments: cues for safety    Extremity/Trunk Assessment              Vision       Perception     Praxis      Cognition Arousal: Alert Behavior During Therapy: WFL for tasks assessed/performed, Impulsive Overall Cognitive Status: Impaired/Different from baseline Area of Impairment: Safety/judgement, Problem solving, Memory, Awareness                   Current Attention Level: Sustained Memory: Decreased short-term memory Following Commands: Follows one step commands inconsistently, Follows one step commands with increased time Safety/Judgement: Decreased awareness of safety, Decreased awareness of deficits Awareness: Anticipatory   General Comments: aware she had therapy 2 days ago, cues for safety  Exercises      Shoulder Instructions       General Comments VSS on RA    Pertinent Vitals/ Pain       Pain Assessment Pain Assessment: Faces Faces Pain Scale: No hurt Pain Intervention(s):  Monitored during session  Home Living                                          Prior Functioning/Environment              Frequency  Min 1X/week        Progress Toward Goals  OT Goals(current goals can now be found in the care plan section)  Progress towards OT goals: Progressing toward goals  Acute Rehab OT Goals OT Goal Formulation: With patient Time For Goal Achievement: 11/20/22 Potential to Achieve Goals: Good ADL Goals Pt Will Perform Lower Body Bathing: with modified independence Pt Will Perform Lower Body Dressing: Independently Pt Will Transfer to Toilet: with modified independence Pt Will Perform Toileting - Clothing Manipulation and hygiene: with supervision  Plan      Co-evaluation                 AM-PAC OT "6 Clicks" Daily Activity     Outcome Measure   Help from another person eating meals?: A Little Help from another person taking care of personal grooming?: A Little Help from another person toileting, which includes using toliet, bedpan, or urinal?: A Little Help from another person bathing (including washing, rinsing, drying)?: A Little Help from another person to put on and taking off regular upper body clothing?: A Little Help from another person to put on and taking off regular lower body clothing?: A Little 6 Click Score: 18    End of Session Equipment Utilized During Treatment: Gait belt;Rolling walker (2 wheels)  OT Visit Diagnosis: Unsteadiness on feet (R26.81);Repeated falls (R29.6);Muscle weakness (generalized) (M62.81)   Activity Tolerance Patient tolerated treatment well   Patient Left in chair;with call bell/phone within reach;with chair alarm set   Nurse Communication Mobility status        Time: 9563-8756 OT Time Calculation (min): 34 min  Charges: OT General Charges $OT Visit: 1 Visit OT Treatments $Self Care/Home Management : 23-37 mins  Donna Hopkins, OTA Acute Rehabilitation Services   Office [phone]   Dewain Penning 11/08/2022, 8:44 AM

## 2022-11-08 NOTE — Progress Notes (Addendum)
ANTICOAGULATION CONSULT NOTE - Initial Consult  Pharmacy Consult for warfarin Indication: mechanical AVR  Allergies  Allergen Reactions   Canagliflozin Shortness Of Breath   Metformin Diarrhea    Other Reaction(s): Diarrhea   Tetanus Toxoids     Infection at injection site    Patient Measurements: Weight: 100.1 kg (220 lb 10.9 oz)   Vital Signs: Temp: 98.2 F (36.8 C) (08/29 0800) Temp Source: Oral (08/29 0800) BP: 129/72 (08/29 0800) Pulse Rate: 82 (08/29 0800)  Labs: Recent Labs    11/06/22 0457 11/07/22 1024 11/08/22 0500  HGB 9.9* 9.7*  --   HCT 33.1* 31.5*  --   PLT 324 310  --   LABPROT 37.4* 24.3* 21.8*  INR 3.8* 2.2* 1.9*  CREATININE 0.82 0.74 0.86    Estimated Creatinine Clearance: 77.8 mL/min (by C-G formula based on SCr of 0.86 mg/dL).   Medical History: Past Medical History:  Diagnosis Date   Carotid stenosis, asymptomatic, bilateral    Chronic kidney disease, stage III (moderate) (HCC)    Chronic low back pain    Clinical systolic heart failure, chronic (HCC)    Depression    Diabetes mellitus    Eosinophilic gastritis    GERD (gastroesophageal reflux disease)    Gout, renal disease    H/O mechanical aortic valve replacement    on coumadin   Hx of aortic valve replacement    Hypertension    Hypocalcemia    Hyponatremia    ICD (implantable cardioverter-defibrillator) in place    Medication refill    Mixed hyperlipidemia    Neuropathy    Obesity    OP (osteoporosis)    Peripheral vascular disease, unspecified (HCC)    Persistent insomnia    Personal history of colonic polyps    Pulmonary hypertension (HCC)    Snoring    declines sleep study   Tricuspid regurgitation    Uncontrolled type 2 diabetes mellitus with complication    Valgus foot    Vertigo, benign positional    Vitamin D deficiency     Assessment: 67 yo female on chronic Coumadin for mechanical AVR.   -Called Dr. Dr. Marcy Salvo Rosario's office  [phone]) 8/29  and goal INR was reported as 2.5-3.5 -INR= 1.9, hg= 9.7 -she is on an amiodarone load and augmentin which will likely decrease coumadin requirements but the interaction may be delayed  PTA taking Coumadin 7.5 mg daily, although patient says dose adjusted frequently.  Goal of Therapy:  INR goal 2.5-3.0 for no then further plans per outpatient management Heparin level 0.3-0.7 units/ml Monitor platelets by anticoagulation protocol: Yes   Plan:  -Warfarin 7.5mg  po today -Daily INR   Harland German, PharmD Clinical Pharmacist **Pharmacist phone directory can now be found on [email or web address] ([email or web address]).  Listed under [facility].

## 2022-11-09 ENCOUNTER — Inpatient Hospital Stay (HOSPITAL_COMMUNITY): Payer: 59

## 2022-11-09 DIAGNOSIS — L0390 Cellulitis, unspecified: Secondary | ICD-10-CM | POA: Diagnosis not present

## 2022-11-09 DIAGNOSIS — A419 Sepsis, unspecified organism: Secondary | ICD-10-CM | POA: Diagnosis not present

## 2022-11-09 LAB — BASIC METABOLIC PANEL
Anion gap: 10 (ref 5–15)
BUN: 13 mg/dL (ref 8–23)
CO2: 29 mmol/L (ref 22–32)
Calcium: 9 mg/dL (ref 8.9–10.3)
Chloride: 95 mmol/L — ABNORMAL LOW (ref 98–111)
Creatinine, Ser: 0.81 mg/dL (ref 0.44–1.00)
GFR, Estimated: >60 mL/min (ref >60)
Glucose, Bld: 117 mg/dL — ABNORMAL HIGH (ref 70–99)
Potassium: 3.6 mmol/L (ref 3.5–5.1)
Sodium: 134 mmol/L — ABNORMAL LOW (ref 135–145)

## 2022-11-09 LAB — MAGNESIUM: Magnesium: 1.8 mg/dL (ref 1.7–2.4)

## 2022-11-09 LAB — PROTIME-INR
INR: 1.9 — ABNORMAL HIGH (ref 0.8–1.2)
Prothrombin Time: 22.3 s — ABNORMAL HIGH (ref 11.4–15.2)

## 2022-11-09 LAB — GLUCOSE, CAPILLARY
Glucose-Capillary: 110 mg/dL — ABNORMAL HIGH (ref 70–99)
Glucose-Capillary: 150 mg/dL — ABNORMAL HIGH (ref 70–99)
Glucose-Capillary: 225 mg/dL — ABNORMAL HIGH (ref 70–99)
Glucose-Capillary: 125 mg/dL — ABNORMAL HIGH (ref 70–99)

## 2022-11-09 MED ORDER — MAGNESIUM SULFATE 2 GM/50ML IV SOLN
2.0000 g | Freq: Once | INTRAVENOUS | Status: AC
  Administered 2022-11-09: 2 g via INTRAVENOUS
  Filled 2022-11-09: qty 50

## 2022-11-09 MED ORDER — POTASSIUM CHLORIDE CRYS ER 20 MEQ PO TBCR
40.0000 meq | EXTENDED_RELEASE_TABLET | Freq: Once | ORAL | Status: AC
  Administered 2022-11-09: 40 meq via ORAL
  Filled 2022-11-09: qty 2

## 2022-11-09 MED ORDER — WARFARIN SODIUM 7.5 MG PO TABS
7.5000 mg | ORAL_TABLET | Freq: Once | ORAL | Status: AC
  Administered 2022-11-09: 7.5 mg via ORAL
  Filled 2022-11-09: qty 1

## 2022-11-09 NOTE — Progress Notes (Signed)
ANTICOAGULATION CONSULT NOTE  Pharmacy Consult for warfarin Indication: mechanical AVR  Allergies  Allergen Reactions   Canagliflozin Shortness Of Breath   Metformin Diarrhea    Other Reaction(s): Diarrhea   Tetanus Toxoids     Infection at injection site    Patient Measurements: Weight: 97.9 kg (215 lb 14.4 oz)   Vital Signs: Temp: 99.5 F (37.5 C) (08/30 0746) Temp Source: Oral (08/30 0746) BP: 128/63 (08/30 0746) Pulse Rate: 83 (08/30 0746)  Labs: Recent Labs    11/07/22 1024 11/08/22 0500 11/09/22 0351  HGB 9.7*  --   --   HCT 31.5*  --   --   PLT 310  --   --   LABPROT 24.3* 21.8* 22.3*  INR 2.2* 1.9* 1.9*  CREATININE 0.74 0.86 0.81    Estimated Creatinine Clearance: 81.6 mL/min (by C-G formula based on SCr of 0.81 mg/dL).   Medical History: Past Medical History:  Diagnosis Date   Carotid stenosis, asymptomatic, bilateral    Chronic kidney disease, stage III (moderate) (HCC)    Chronic low back pain    Clinical systolic heart failure, chronic (HCC)    Depression    Diabetes mellitus    Eosinophilic gastritis    GERD (gastroesophageal reflux disease)    Gout, renal disease    H/O mechanical aortic valve replacement    on coumadin   Hx of aortic valve replacement    Hypertension    Hypocalcemia    Hyponatremia    ICD (implantable cardioverter-defibrillator) in place    Medication refill    Mixed hyperlipidemia    Neuropathy    Obesity    OP (osteoporosis)    Peripheral vascular disease, unspecified (HCC)    Persistent insomnia    Personal history of colonic polyps    Pulmonary hypertension (HCC)    Snoring    declines sleep study   Tricuspid regurgitation    Uncontrolled type 2 diabetes mellitus with complication    Valgus foot    Vertigo, benign positional    Vitamin D deficiency     Assessment: 67 yo female on chronic Coumadin for mechanical AVR.   -Called Dr. Dr. Marcy Salvo Rosario's office  [phone]) 8/29 and goal INR was  reported as 2.5-3.5 -INR= 1.9, hg= 9.7 -she is on an amiodarone load and augmentin which will likely decrease coumadin requirements but the interaction may be delayed  PTA taking Coumadin 7.5 mg daily, although patient says dose adjusted frequently.  Goal of Therapy:  INR goal 2.5-3.0 for no then further plans per outpatient management Heparin level 0.3-0.7 units/ml Monitor platelets by anticoagulation protocol: Yes   Plan:  -Warfarin 7.5mg  po today -Daily INR -If discharging would recommend warfarin 7.5mg  on 8/30 and 8/31 then 5mg  daily with INR early next week (dose reduction due to INR 4.1 on admission and concurrent amiodarone and Augmentin)   Harland German, PharmD Clinical Pharmacist **Pharmacist phone directory can now be found on [email or web address] ([email or web address]).  Listed under [facility].

## 2022-11-09 NOTE — Progress Notes (Signed)
PROGRESS NOTE    Donna Hopkins  XNA:[id number] DOB: 09-Apr-1955 DOA: 11/03/2022 PCP: Dennard Schaumann, MD    Brief Narrative:   Donna Hopkins is a 67 y.o. female with past medical history significant for mechanical aortic valve repair on Coumadin, carotid stenosis, chronic systolic congestive heart failure s/p CRT-D, type 2 diabetes mellitus, HTN, HLD, CKD stage IIIa, pulmonary HTN, depression, chronic left foot ulcer currently on oral antibiotics per podiatry who presented to [facility] ED on 8/24 from home following fall with reported striking her head with a large hematoma to the left side of her head.  Denies loss of consciousness but currently on anticoagulation with Coumadin.  [name]  Reports associated headache, blurred vision and vomiting.  Denies weakness, no dizziness, no shortness of breath, no nausea/vomiting/diarrhea, no abdominal pain, no urinary symptoms, no changes in mental status.  Notes left foot ulcer currently being treated by podiatry, on Augmentin since 8/20.  In the ED, temperature 98.0 F, HR 120, RR 18, BP 155/79, SpO2 97% on room air.  WBC 11.7, hemoglobin 11.7, platelet count 372.  Sodium 133, potassium 3.2, chloride 94, CO2 26, glucose 195, BUN 10, creatinine 1.19.  AST 25, ALT 18, total bilirubin 1.1.  Lactic acid 2.8.  Urinalysis unrevealing.  Chest x-ray with no acute chest findings.CT head with large left scalp hematoma and parietal region near the vertex with areas of subarachnoid hemorrhage in the basilar cisterns and on the left near the vertex, small subdural component noted on the left near the vertex.  CT cervical spine without acute abnormality.  Patient received doxepin, Flagyl, vancomycin, lactated Ringer's.  Neurosurgery was consulted; and did not feel indication for reversal of her Coumadin at this point.  TRH consulted for admission for further evaluation and management of SAH/SDH.  Assessment &  Plan:   Subarachnoid hemorrhage Subdural hematoma Scalp hematoma Patient presenting to the ED with left sided hematoma and scalp pain.  CT head with large left scalp hematoma and parietal region near the vertex with areas of subarachnoid hemorrhage in the basilar cisterns and on the left near the vertex, small subdural component noted on the left near the vertex.  CT cervical spine without acute abnormality.  Seen by neurosurgery with repeat head CT on 8/26 showing no further progression of subdural/subarachnoid hemorrhage.  Neurosurgery recommended holding Coumadin x 3 days then may resume. -- Continue PT/OT efforts  Left foot diabetic infection Patient follow-up with podiatry outpatient, [name].  Currently on Augmentin outpatient.  CT foot with soft tissue swelling/ulceration plantar aspect first metatarsal head with no fluid collection or signs of osteomyelitis.  Seen by podiatry during hospitalization with no surgical needs at this time.  Continue Betadine wet-to-dry dressings, weightbearing as tolerated.  Outpatient follow-up with podiatry 1 week after discharge.  Ventricular tachycardia Upon ED presentation, patient noted to have a heart rate in 130s, likely secondary to her structural heart disease, CHF.  Seen by cardiology, electrophysiology during hospitalization.  Not a candidate for invasive coronary evaluation given subdural/subarachnoid hemorrhage.  TTE 11/05/2022 with LVEF 60 to 65%, grade 1 diastolic dysfunction with moderate LVH. -- Amiodarone 400 mg BID x 5 days; followed by 200mg  PO BID x 5 days, followed by 200mg  PO daily -- Outpatient follow-up with cardiology  Chronic systolic congestive heart failure s/p CRT-D -- Carvedilol 25 mg p.o. twice daily -- Ramipril 20 mg p.o. daily -- Furosemide 80 mg p.o. daily -- 1200 mL fluid restriction --  Strict I's and O's Daily weights  Type 2 diabetes mellitus -- Levemir 40 units subcutaneously twice daily -- Moderate SSI for  coverage -- CBG before every meal/at bedtime  Mechanical aortic valve on anticoagulation Supratherapeutic INR -- Resumed Coumadin, pharmacy consulted for dosing/monitoring -- INR daily; 1.9 today  Hypomagnesemia Hypokalemia Potassium 3.6, magnesium 1.8, will replete.   -- Repeat electrolytes in a.m.  GERD -- Protonix 40 mg p.o. daily  HLD: -- Atorvastatin 10 mg p.o. daily  Depression -- Sertraline 100 mg p.o. daily  Obesity Body mass index is 31.88 kg/m.  Complicates all facets of care   DVT prophylaxis: SCDs Start: 11/04/22 7253 warfarin (COUMADIN) tablet 7.5 mg    Code Status: Full Code Family Communication:   Disposition Plan:  Level of care: Telemetry Cardiac Status is: Inpatient Remains inpatient appropriate because: INR remains subtherapeutic, plan discharge home once INR in therapeutic range    Consultants:  Neurosurgery Cardiology/electrophysiology Podiatry  Procedures:  TTE  Antimicrobials:  Vancomycin 8/24 - 8/27 Metronidazole 8/24 - 8/28 Cefepime 8/24 - 8/27 Ceftriaxone 8/27 - 8/27 Bactrim 8/28>>   Subjective: Patient seen examined at bedside, resting comfortably.  Lying in bed.  Would like to go home as soon as possible otherwise no specific complaints this morning.  INR remains at 1.9 today.  Discussed with patient would like her to be in the therapeutic range before discharge home.  Denies headache, no dizziness, no blurred vision, no fever/chills/night sweats, no nausea/vomiting/diarrhea, no chest pain, no palpitations, no shortness of breath, no abdominal pain, no focal weakness, no fatigue, no paresthesias.  No acute events overnight per nursing.  Objective: Vitals:   11/09/22 0235 11/09/22 0335 11/09/22 0746 11/09/22 0914  BP: 132/63 131/65 128/63 (!) 146/83  Pulse: 76 77 83   Resp: 18 17 15    Temp:  98.7 F (37.1 C) 99.5 F (37.5 C)   TempSrc:  Oral Oral   SpO2: 93% 95% 96%   Weight:        Intake/Output Summary (Last 24 hours)  at 11/09/2022 1119 Last data filed at 11/08/2022 2300 Gross per 24 hour  Intake 126 ml  Output --  Net 126 ml   Filed Weights   11/06/22 0349 11/08/22 0500 11/09/22 0220  Weight: 100 kg 100.1 kg 97.9 kg    Examination:  Physical Exam: GEN: NAD, alert and oriented x 3, obese HEENT: NCAT, PERRL, EOMI, sclera clear, MMM PULM: CTAB w/o wheezes/crackles, normal respiratory effort, on room air CV: RRR w/o M/G/R GI: abd soft, NTND, + BS MSK: no peripheral edema, moves all extremities independently with preserved strength NEURO: CN II-XII intact, no focal deficits, sensation to light touch intact PSYCH: normal mood/affect    Data Reviewed: I have personally reviewed following labs and imaging studies  CBC: Recent Labs  Lab 11/03/22 2116 11/05/22 0419 11/06/22 0457 11/07/22 1024  WBC 11.7* 7.7 7.3 6.5  NEUTROABS 8.0*  --  4.1 4.2  HGB 11.7* 9.7* 9.9* 9.7*  HCT 36.8 31.4* 33.1* 31.5*  MCV 89.8 92.9 94.8 92.6  PLT 372 263 324 310   Basic Metabolic Panel: Recent Labs  Lab 11/05/22 0419 11/06/22 0457 11/07/22 1024 11/08/22 0500 11/09/22 0351  NA 132* 134* 134* 132* 134*  K 3.8 3.8 3.7 3.4* 3.6  CL 97* 98 99 97* 95*  CO2 24 24 22 26 29   GLUCOSE 124* 104* 127* 140* 117*  BUN 12 13 11 15 13   CREATININE 0.86 0.82 0.74 0.86 0.81  CALCIUM 8.0* 8.4* 8.5*  8.5* 9.0  MG 2.2 2.0 1.7 2.0 1.8   GFR: Estimated Creatinine Clearance: 81.6 mL/min (by C-G formula based on SCr of 0.81 mg/dL). Liver Function Tests: Recent Labs  Lab 11/03/22 2116 11/04/22 0942 11/05/22 0419  AST 25 17 19   ALT 18 13 11   ALKPHOS 103 78 79  BILITOT 1.1 0.9 1.0  PROT 8.4* 6.5 7.1  ALBUMIN 3.5 2.6* 2.8*   No results for input(s): "LIPASE", "AMYLASE" in the last 168 hours. No results for input(s): "AMMONIA" in the last 168 hours. Coagulation Profile: Recent Labs  Lab 11/05/22 0419 11/06/22 0457 11/07/22 1024 11/08/22 0500 11/09/22 0351  INR 4.7* 3.8* 2.2* 1.9* 1.9*   Cardiac  Enzymes: Recent Labs  Lab 11/03/22 2116  CKTOTAL 98   BNP (last 3 results) No results for input(s): "PROBNP" in the last 8760 hours. HbA1C: No results for input(s): "HGBA1C" in the last 72 hours. CBG: Recent Labs  Lab 11/08/22 0728 11/08/22 1218 11/08/22 1614 11/08/22 2109 11/09/22 0744  GLUCAP 105* 185* 184* 247* 110*   Lipid Profile: No results for input(s): "CHOL", "HDL", "LDLCALC", "TRIG", "CHOLHDL", "LDLDIRECT" in the last 72 hours. Thyroid Function Tests: No results for input(s): "TSH", "T4TOTAL", "FREET4", "T3FREE", "THYROIDAB" in the last 72 hours. Anemia Panel: No results for input(s): "VITAMINB12", "FOLATE", "FERRITIN", "TIBC", "IRON", "RETICCTPCT" in the last 72 hours. Sepsis Labs: Recent Labs  Lab 11/03/22 2128 11/03/22 2301 11/04/22 0246 11/04/22 0428  LATICACIDVEN 2.8* 3.1* 2.2* 1.8    Recent Results (from the past 240 hour(s))  Culture, blood (Routine x 2)     Status: None   Collection Time: 11/03/22  9:12 PM   Specimen: BLOOD  Result Value Ref Range Status   Specimen Description BLOOD BLOOD RIGHT ARM  Final   Special Requests   Final    BOTTLES DRAWN AEROBIC ONLY Blood Culture adequate volume   Culture   Final    NO GROWTH 5 DAYS Performed at [facility], [address] [place], [place] [address]    Report Status 11/08/2022 FINAL  Final  Culture, blood (Routine x 2)     Status: None   Collection Time: 11/03/22  9:16 PM   Specimen: BLOOD  Result Value Ref Range Status   Specimen Description BLOOD BLOOD LEFT ARM  Final   Special Requests   Final    BOTTLES DRAWN AEROBIC AND ANAEROBIC Blood Culture adequate volume   Culture   Final    NO GROWTH 5 DAYS Performed at [facility], [address], [place], [place] [address]    Report Status 11/08/2022 FINAL  Final  Aerobic/Anaerobic Culture w Gram Stain (surgical/deep wound)     Status: None (Preliminary result)   Collection Time: 11/04/22  6:01 AM   Specimen: Other Source; Wound   Result Value Ref Range Status   Specimen Description OTHER  Final   Special Requests Foot, Left  Final   Gram Stain NO WBC SEEN NO ORGANISMS SEEN   Final   Culture   Final    NO GROWTH 4 DAYS NO ANAEROBES ISOLATED; CULTURE IN PROGRESS FOR 5 DAYS Performed at [facility], [address], [place], [place] [address]    Report Status PENDING  Incomplete         Radiology Studies: No results found.      Scheduled Meds:  [START ON 11/11/2022] amiodarone  200 mg Oral BID   amiodarone  400 mg Oral BID   amoxicillin-clavulanate  1 tablet  Oral Q12H   atorvastatin  10 mg Oral Daily   carvedilol  25 mg Oral BID WC   feeding supplement  237 mL Oral BID BM   ferrous sulfate  325 mg Oral Q breakfast   furosemide  80 mg Oral Daily   gabapentin  300 mg Oral TID   insulin aspart  0-15 Units Subcutaneous TID WC   insulin aspart  0-5 Units Subcutaneous QHS   insulin detemir  40 Units Subcutaneous BID   pantoprazole  40 mg Oral Daily   pneumococcal 20-valent conjugate vaccine  0.5 mL Intramuscular Tomorrow-1000   ramipril  20 mg Oral Daily   sertraline  100 mg Oral Daily   sodium chloride flush  3 mL Intravenous Q12H   warfarin  7.5 mg Oral ONCE-1600   Warfarin - Pharmacist Dosing Inpatient   Does not apply q1600   Continuous Infusions:   LOS: 5 days    Time spent: 48 minutes spent on chart review, discussion with nursing staff, consultants, updating family and interview/physical exam; more than 50% of that time was spent in counseling and/or coordination of care.    Alvira Philips [place], DO [facility] Available via Epic secure chat 7am-7pm After these hours, please refer to coverage provider listed on [email or web address] 11/09/2022, 11:19 AM .

## 2022-11-09 NOTE — Progress Notes (Signed)
She looks good, neuro non-focal, thinks she 's going home soon. On warfarin. No headache. Will get one more repeat HCT prior to d/c

## 2022-11-09 NOTE — Plan of Care (Signed)

## 2022-11-09 NOTE — Progress Notes (Signed)
Mobility Specialist Progress Note:    11/09/22 1347  Mobility  Activity Ambulated with assistance in hallway  Level of Assistance Contact guard assist, steadying assist  Assistive Device Front wheel walker  Distance Ambulated (ft) 300 ft  Activity Response Tolerated well  Mobility Referral Yes  $Mobility charge 1 Mobility  Mobility Specialist Start Time (ACUTE ONLY) 1135  Mobility Specialist Stop Time (ACUTE ONLY) 1153  Mobility Specialist Time Calculation (min) (ACUTE ONLY) 18 min   Received pt in BR having no complaints and agreeable to mobility. Pt was asymptomatic throughout ambulation and returned to room w/o fault. Left seated EOB w/ call bell in reach and all needs met. RN in room.    Thompson Grayer Mobility Specialist  Please contact vis Secure Chat or  Rehab Office [phone]

## 2022-11-09 NOTE — Plan of Care (Signed)
Patient vitals have been stable. Had 2x BM today and taking pain medication around t he clock. CT of head is ordered for patient for subdural hematoma. No complications noted and will continue to monitor patient

## 2022-11-10 ENCOUNTER — Other Ambulatory Visit (HOSPITAL_COMMUNITY): Payer: Self-pay

## 2022-11-10 DIAGNOSIS — A419 Sepsis, unspecified organism: Secondary | ICD-10-CM | POA: Diagnosis not present

## 2022-11-10 DIAGNOSIS — L0390 Cellulitis, unspecified: Secondary | ICD-10-CM | POA: Diagnosis not present

## 2022-11-10 LAB — BASIC METABOLIC PANEL
Anion gap: 7 (ref 5–15)
BUN: 16 mg/dL (ref 8–23)
CO2: 27 mmol/L (ref 22–32)
Calcium: 8.5 mg/dL — ABNORMAL LOW (ref 8.9–10.3)
Chloride: 98 mmol/L (ref 98–111)
Creatinine, Ser: 1.26 mg/dL — ABNORMAL HIGH (ref 0.44–1.00)
GFR, Estimated: 46 mL/min — ABNORMAL LOW (ref >60)
Glucose, Bld: 184 mg/dL — ABNORMAL HIGH (ref 70–99)
Potassium: 3.7 mmol/L (ref 3.5–5.1)
Sodium: 132 mmol/L — ABNORMAL LOW (ref 135–145)

## 2022-11-10 LAB — MAGNESIUM: Magnesium: 1.8 mg/dL (ref 1.7–2.4)

## 2022-11-10 LAB — GLUCOSE, CAPILLARY: Glucose-Capillary: 148 mg/dL — ABNORMAL HIGH (ref 70–99)

## 2022-11-10 LAB — AEROBIC/ANAEROBIC CULTURE W GRAM STAIN (SURGICAL/DEEP WOUND)
Culture: NO GROWTH
Gram Stain: NONE SEEN

## 2022-11-10 LAB — PROTIME-INR
INR: 2.2 — ABNORMAL HIGH (ref 0.8–1.2)
Prothrombin Time: 24.2 s — ABNORMAL HIGH (ref 11.4–15.2)

## 2022-11-10 MED ORDER — WARFARIN SODIUM 5 MG PO TABS: 5.0000 mg | ORAL_TABLET | Freq: Once | ORAL | Status: DC

## 2022-11-10 MED ORDER — WARFARIN SODIUM 5 MG PO TABS
5.0000 mg | ORAL_TABLET | Freq: Every day | ORAL | 0 refills | Status: AC
  Filled 2022-11-10: qty 90, 90d supply, fill #0

## 2022-11-10 MED ORDER — AMIODARONE HCL 200 MG PO TABS
ORAL_TABLET | ORAL | 0 refills | Status: DC
  Filled 2022-11-10: qty 108, 98d supply, fill #0

## 2022-11-10 NOTE — Progress Notes (Signed)
CT head reviewed.  No acute changes.  Can continue with discharge planning.

## 2022-11-10 NOTE — Plan of Care (Signed)

## 2022-11-10 NOTE — Plan of Care (Signed)
  Problem: Health Behavior/Discharge Planning: Goal: Ability to manage health-related needs will improve Outcome: Progressing   Problem: Clinical Measurements: Goal: Ability to maintain clinical measurements within normal limits will improve Outcome: Progressing Goal: Will remain free from infection Outcome: Progressing Goal: Diagnostic test results will improve Outcome: Progressing Goal: Respiratory complications will improve Outcome: Progressing Goal: Cardiovascular complication will be avoided Outcome: Progressing   Problem: Activity: Goal: Risk for activity intolerance will decrease Outcome: Progressing   Problem: Nutrition: Goal: Adequate nutrition will be maintained Outcome: Progressing   Problem: Coping: Goal: Level of anxiety will decrease Outcome: Progressing   Problem: Pain Managment: Goal: General experience of comfort will improve Outcome: Progressing   Problem: Safety: Goal: Ability to remain free from injury will improve Outcome: Progressing   Problem: Skin Integrity: Goal: Risk for impaired skin integrity will decrease Outcome: Progressing   

## 2022-11-10 NOTE — Progress Notes (Signed)
ANTICOAGULATION CONSULT NOTE  Pharmacy Consult for warfarin Indication: mechanical AVR  Allergies  Allergen Reactions   Canagliflozin Shortness Of Breath   Metformin Diarrhea    Other Reaction(s): Diarrhea   Tetanus Toxoids     Infection at injection site    Patient Measurements: Weight: 99.1 kg (218 lb 8 oz)   Vital Signs: Temp: 98.5 F (36.9 C) (08/31 0849) Temp Source: Oral (08/31 0849) BP: 136/64 (08/31 0849) Pulse Rate: 73 (08/31 0849)  Labs: Recent Labs    11/07/22 1024 11/08/22 0500 11/09/22 0351 11/10/22 0635  HGB 9.7*  --   --   --   HCT 31.5*  --   --   --   PLT 310  --   --   --   LABPROT 24.3* 21.8* 22.3* 24.2*  INR 2.2* 1.9* 1.9* 2.2*  CREATININE 0.74 0.86 0.81 1.26*    Estimated Creatinine Clearance: 52.8 mL/min (A) (by C-G formula based on SCr of 1.26 mg/dL (H)).   Medical History: Past Medical History:  Diagnosis Date   Carotid stenosis, asymptomatic, bilateral    Chronic kidney disease, stage III (moderate) (HCC)    Chronic low back pain    Clinical systolic heart failure, chronic (HCC)    Depression    Diabetes mellitus    Eosinophilic gastritis    GERD (gastroesophageal reflux disease)    Gout, renal disease    H/O mechanical aortic valve replacement    on coumadin   Hx of aortic valve replacement    Hypertension    Hypocalcemia    Hyponatremia    ICD (implantable cardioverter-defibrillator) in place    Medication refill    Mixed hyperlipidemia    Neuropathy    Obesity    OP (osteoporosis)    Peripheral vascular disease, unspecified (HCC)    Persistent insomnia    Personal history of colonic polyps    Pulmonary hypertension (HCC)    Snoring    declines sleep study   Tricuspid regurgitation    Uncontrolled type 2 diabetes mellitus with complication    Valgus foot    Vertigo, benign positional    Vitamin D deficiency     Assessment: 67 yo female on chronic Coumadin for mechanical AVR.   -Per Dr. Dr. Marcy Salvo Rosario's  office  [phone]) 8/29, goal INR was reported as 2.5-3.5 -INR= 2.2, hg= 9.7 -she is on an amiodarone load and Augmentin which will likely decrease coumadin requirements but the interaction may be delayed  PTA taking Coumadin 7.5 mg daily, although patient says dose adjusted frequently.  Goal of Therapy:  INR goal 2.5-3.0 for no then further plans per outpatient management Monitor platelets by anticoagulation protocol: Yes   Plan:  -Warfarin 5 mg PO today -Daily INR -If discharging would recommend warfarin 5mg  daily with INR early next week (dose reduction due to INR 4.1 on admission and concurrent amiodarone and Augmentin)   Lennie Muckle, PharmD PGY1 Pharmacy Resident 11/10/2022 9:25 AM

## 2022-11-10 NOTE — Discharge Summary (Addendum)
Physician Discharge Summary  Donna Hopkins ZOX:[id number] DOB: 24-Oct-1955 DOA: 11/03/2022  PCP: Donna Schaumann, MD  Admit date: 11/03/2022 Discharge date: 11/10/2022  Admitted From: Home Disposition: Home  Recommendations for Outpatient Follow-up:  Follow up with PCP in 1-2 weeks Outpatient follow-up with cardiology, Donna Hopkins as scheduled this upcoming week for recheck of INR Continue amiodarone taper Warfarin decreased to 5 mg p.o. daily due to supratherapeutic INR on admission Continue Augmentin and outpatient follow-up with podiatry for diabetic foot ulcer Please obtain INR 1 week  Home Health: PT/OT Equipment/Devices: None  Discharge Condition: Stable CODE STATUS: Full code Diet recommendation: Heart healthy/consistent carbohydrate diet  History of present illness:  Donna Hopkins is a 67 y.o. 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  Denies loss of consciousness but currently on anticoagulation with Coumadin.  [name]  Reports associated headache, blurred vision and vomiting.  Denies weakness, no dizziness, no shortness of breath, no nausea/vomiting/diarrhea, no abdominal pain, no urinary symptoms, no changes in mental status.   Notes left foot ulcer currently being treated by podiatry, on Augmentin since 8/20.   In the ED, temperature 98.0 F, HR 120, RR 18, BP 155/79, SpO2 97% on room air.  WBC 11.7, hemoglobin 11.7, platelet count 372.  Sodium 133, potassium 3.2, chloride 94, CO2 26, glucose 195,  BUN 10, creatinine 1.19.  AST 25, ALT 18, total bilirubin 1.1.  Lactic acid 2.8.  Urinalysis unrevealing.  Chest x-ray with no acute chest findings.CT head with large left scalp hematoma and parietal region near the vertex with areas of subarachnoid hemorrhage in the basilar cisterns and on the left near the vertex, small subdural component noted on the left near the vertex.  CT cervical spine without acute abnormality.  Patient received doxepin, Flagyl, vancomycin, lactated Ringer's.  Neurosurgery was consulted; and did not feel indication for reversal of her Coumadin at this point.  TRH consulted for admission for further evaluation and management of SAH/SDH.  Hospital course:  Subarachnoid hemorrhage Subdural hematoma Scalp hematoma Patient presenting to the ED with left sided hematoma and scalp pain.  CT head with large left scalp hematoma and parietal region near the vertex with areas of subarachnoid hemorrhage in the basilar cisterns and on the left near the vertex, small subdural component noted on the left near the vertex.  CT cervical spine without acute abnormality.  Seen by neurosurgery with repeat head CT on 8/26 showing no further progression of subdural/subarachnoid hemorrhage.  Neurosurgery recommended holding Coumadin x 3 days then may resume.  Repeat CT head without contrast on 11/09/2022 remained stable, once again seen by neurosurgery with no further recommendations.   Left foot diabetic infection Patient follow-up with podiatry outpatient, Donna Hopkins.  Currently on Augmentin outpatient.  CT foot with soft tissue swelling/ulceration plantar aspect first metatarsal head with no fluid collection or signs of osteomyelitis.  Seen by podiatry during hospitalization with no surgical needs at this time.  Continue Betadine wet-to-dry dressings, weightbearing as tolerated.  Outpatient follow-up with podiatry 1 week after discharge.   Ventricular tachycardia Upon ED presentation,  patient noted  to have a heart rate in 130s, likely secondary to her structural heart disease, CHF.  Seen by cardiology, electrophysiology during hospitalization.  Not a candidate for invasive coronary evaluation given subdural/subarachnoid hemorrhage.  TTE 11/05/2022 with LVEF 60 to 65%, grade 1 diastolic dysfunction with moderate LVH. Amiodarone 400 mg BID x 5 days; followed by 200mg  PO BID x 5 days, followed by 200mg  PO daily. Outpatient follow-up with cardiology   Chronic systolic congestive heart failure s/p CRT-D Carvedilol 25 mg p.o. twice daily, Ramipril 20 mg p.o. daily, Furosemide 80 mg p.o. daily, 1200 mL fluid restriction   Type 2 diabetes mellitus Levemir 40 units subcutaneously twice daily, Jardiance, semaglutide   Mechanical aortic valve on anticoagulation Supratherapeutic INR Resumed Coumadin, pharmacy recommended 5 mg p.o. daily on discharge.  Has upcoming appoint with cardiology with recommendation to repeat INR.  INR 2.2 at time of discharge; avoiding bridging given subdural hematoma and subarachnoid hemorrhage.   Hypomagnesemia Hypokalemia Repleted during hospitalization.   GERD Omeprazole 80 mg p.o. daily   HLD: Atorvastatin 10 mg p.o. daily   Depression Sertraline 100 mg p.o. daily   Obesity Body mass index is 31.88 kg/m.  Complicates all facets of care  Discharge Diagnoses:  Principal Problem:   Sepsis due to cellulitis [facility]) Active Problems:   Sepsis with acute renal failure without septic shock (HCC)   Subarachnoid bleed (HCC)   Ulcer of left foot with fat layer exposed (HCC)   Type 2 diabetes mellitus with left diabetic foot infection [facility])    Discharge Instructions  Discharge Instructions     Call MD for:  difficulty breathing, headache or visual disturbances   Complete by: As directed    Call MD for:  extreme fatigue   Complete by: As directed    Call MD for:  persistant dizziness or light-headedness   Complete by: As directed    Call MD for:  persistant  nausea and vomiting   Complete by: As directed    Call MD for:  severe uncontrolled pain   Complete by: As directed    Call MD for:  temperature >100.4   Complete by: As directed    Diet - low sodium heart healthy   Complete by: As directed    Discharge wound care:   Complete by: As directed    Dress daily with Mepilex   Increase activity slowly   Complete by: As directed       Allergies as of 11/10/2022       Reactions   Canagliflozin Shortness Of Breath   Metformin Diarrhea   Other Reaction(s): Diarrhea   Tetanus Toxoids    Infection at injection site        Medication List     STOP taking these medications    gentamicin cream 0.1 % Commonly known as: GARAMYCIN   HumaLOG KwikPen 100 UNIT/ML KwikPen Generic drug: insulin lispro       TAKE these medications    acetaminophen 650 MG CR tablet Commonly known as: TYLENOL Take 1,300 mg by mouth daily as needed for pain.   albuterol 108 (90 Base) MCG/ACT inhaler Commonly known as: VENTOLIN HFA Inhale 2 puffs into the lungs every 6 (six) hours as needed for wheezing or shortness of breath.   ALPRAZolam 0.5 MG tablet Commonly known as: XANAX Take 0.5 mg by mouth 2 (two) times daily as needed for anxiety or sleep.   amiodarone 200 MG tablet Commonly known as: Pacerone Take 2 tablets (400  mg total) by mouth 2 (two) times daily for 1 day, THEN 1 tablet (200 mg total) 2 (two) times daily for 7 days, THEN 1 tablet (200 mg total) daily. Start taking on: November 10, 2022   amoxicillin-clavulanate 875-125 MG tablet Commonly known as: AUGMENTIN Take 1 tablet by mouth 2 (two) times daily.   atorvastatin 10 MG tablet Commonly known as: LIPITOR Take 10 mg by mouth daily.   carvedilol 25 MG tablet Commonly known as: COREG Take 25 mg by mouth 2 (two) times daily with a meal.   cyclobenzaprine 10 MG tablet Commonly known as: FLEXERIL Take 10 mg by mouth 3 (three) times daily as needed for muscle spasms.    ergocalciferol 1.25 MG (50000 UT) capsule Commonly known as: VITAMIN D2 Take 50,000 Units by mouth once a week.   ferrous sulfate 325 (65 FE) MG tablet Take 325 mg by mouth daily with breakfast.   furosemide 40 MG tablet Commonly known as: LASIX Take 80 mg by mouth daily.   gabapentin 300 MG capsule Commonly known as: NEURONTIN Take 300 mg by mouth 3 (three) times daily.   Jardiance 25 MG Tabs tablet Generic drug: empagliflozin Take 1 tablet by mouth daily.   Levemir FlexTouch 100 UNIT/ML FlexTouch Pen Generic drug: insulin detemir Inject 40 Units into the skin 2 (two) times daily.   mupirocin ointment 2 % Commonly known as: BACTROBAN Apply 1 Application topically 2 (two) times daily.   omeprazole 40 MG capsule Commonly known as: PRILOSEC Take 80 mg by mouth daily.   oxyCODONE-acetaminophen 10-325 MG tablet Commonly known as: PERCOCET Take 1 tablet by mouth every 6 (six) hours as needed for pain.   Ozempic (2 MG/DOSE) 8 MG/3ML Sopn Generic drug: Semaglutide (2 MG/DOSE) Inject 2 mg into the skin once a week.   potassium chloride SA 20 MEQ tablet Commonly known as: KLOR-CON M Take 20 mEq by mouth daily.   ramipril 10 MG capsule Commonly known as: ALTACE Take 2 capsules by mouth daily.   sertraline 100 MG tablet Commonly known as: ZOLOFT Take 100 mg by mouth daily.   triamcinolone cream 0.1 % Commonly known as: KENALOG Apply 1 application topically 2 (two) times daily as needed (dry skin).   warfarin 5 MG tablet Commonly known as: COUMADIN Take 1 tablet (5 mg total) by mouth daily. What changed:  medication strength how much to take when to take this               Discharge Care Instructions  (From admission, onward)           Start     Ordered   11/10/22 0000  Discharge wound care:       Comments: Dress daily with Mepilex   11/10/22 1018            Follow-up Information     [facility] Follow up.   Specialty: Home  Health Services Why: Home Health Physical and Occupational Therapy-office to call with visit times. Contact information: [address] [place] [address] [phone]         Donna Schaumann, MD. Schedule an appointment as soon as possible for a visit in 1 week(s).   Specialty: Internal Medicine Contact information: [address] [place] [address] [phone]         Sherrill Raring, MD. Go to.   Specialty: Cardiology Contact information: [address] [facility] [place] [address] [phone]  Allergies  Allergen Reactions   Canagliflozin Shortness Of Breath   Metformin Diarrhea    Other Reaction(s): Diarrhea   Tetanus Toxoids     Infection at injection site    Consultations: Neurosurgery Cardiology/electrophysiology Podiatry   Procedures/Studies: CT HEAD WO CONTRAST ( )  Result Date: 11/09/2022 CLINICAL DATA:  Subdural hematoma follow-up EXAM: CT HEAD WITHOUT CONTRAST TECHNIQUE: Contiguous axial images were obtained from the base of the skull through the vertex without intravenous contrast. RADIATION DOSE REDUCTION: This exam was performed according to the departmental dose-optimization program which includes automated exposure control, adjustment of the mA and/or kV according to patient size and/or use of iterative reconstruction technique. COMPARISON:  None Available. FINDINGS: Brain: Subarachnoid blood has concentrated in the bilateral ambient cisterns. The parafalcine subdural hematoma is less clearly visible now. There is an area of hypoattenuation in the left cerebellum that is most likely artifactual. Vascular: No hyperdense vessel or unexpected calcification. Skull: No further enlargement of large left lateral scalp hematoma. No skull fracture. Sinuses/Orbits: Negative Other: None IMPRESSION: 1. Subarachnoid blood has concentrated in the bilateral ambient cisterns but has not increased. 2. The  parafalcine subdural hematoma is less clearly visible now. 3. Area of hypoattenuation in the left cerebellum is most likely artifactual. 4. No further enlargement of large left lateral scalp hematoma. Electronically Signed   By: Deatra Robinson M.D.   On: 11/09/2022 21:30   ECHOCARDIOGRAM COMPLETE  Result Date: 11/05/2022    ECHOCARDIOGRAM REPORT   Patient Name:   MIRA ROMIG Date of Exam: 11/05/2022 Medical Rec #:  [id number]     Height:       69.0 in Accession #:    [id number]    Weight:       208.8 lb Date of Birth:  12-Feb-1956     BSA:          2.104 m Patient Age:    67 years      BP:           121/85 mmHg Patient Gender: F             HR:           78 bpm. Exam Location:  Inpatient Procedure: 2D Echo, Color Doppler, Cardiac Doppler and Intracardiac            Opacification Agent Indications:    Aortic Valve Disease  History:        Patient has prior history of Echocardiogram examinations, most                 recent 02/04/2018. CHF, SAH, Signs/Symptoms:Sepsis; Risk                 Factors:Diabetes.                 Aortic Valve: mechanical valve is present in the aortic                 position.  Sonographer:    Milbert Coulter Referring Phys: [id number] Rossie Muskrat LAMBERT IMPRESSIONS  1. Left ventricular ejection fraction, by estimation, is 60 to 65%. The left ventricle has normal function. The left ventricle has no regional wall motion abnormalities. There is moderate concentric left ventricular hypertrophy. Left ventricular diastolic parameters are consistent with Grade I diastolic dysfunction (impaired relaxation).  2. Right ventricular systolic function is normal. The right ventricular size is normal. Tricuspid regurgitation signal is inadequate for assessing PA pressure.  3. The mitral valve is normal in structure. No evidence  of mitral valve regurgitation. No evidence of mitral stenosis.  4. The aortic valve was not well visualized. Aortic valve regurgitation is not visualized. No aortic stenosis is present.  There is a mechanical valve present in the aortic position. Aortic valve area, by VTI measures 1.75 cm. Aortic valve mean gradient  measures 19.7 mmHg. Aortic valve Vmax measures 3.07 m/s. DVI stable at 0.36. Normal functioning prosthesis. No significant change in mean AVG, DVI or Vmax.  5. The inferior vena cava is normal in size with greater than 50% respiratory variability, suggesting right atrial pressure of 3 mmHg.  6. Compared to study dated 06/06/2021, there does not appear to be any significant perivalvular leak of the mechanical AVR. FINDINGS  Left Ventricle: Left ventricular ejection fraction, by estimation, is 60 to 65%. The left ventricle has normal function. The left ventricle has no regional wall motion abnormalities. Definity contrast agent was given IV to delineate the left ventricular  endocardial borders. The left ventricular internal cavity size was normal in size. There is moderate concentric left ventricular hypertrophy. Left ventricular diastolic parameters are consistent with Grade I diastolic dysfunction (impaired relaxation). Normal left ventricular filling pressure. Right Ventricle: The right ventricular size is normal. No increase in right ventricular wall thickness. Right ventricular systolic function is normal. Tricuspid regurgitation signal is inadequate for assessing PA pressure. Left Atrium: Left atrial size was normal in size. Right Atrium: Right atrial size was normal in size. Pericardium: There is no evidence of pericardial effusion. Mitral Valve: The mitral valve is normal in structure. No evidence of mitral valve regurgitation. No evidence of mitral valve stenosis. Tricuspid Valve: The tricuspid valve is normal in structure. Tricuspid valve regurgitation is not demonstrated. No evidence of tricuspid stenosis. Aortic Valve: The aortic valve was not well visualized. Aortic valve regurgitation is not visualized. No aortic stenosis is present. Aortic valve mean gradient measures 19.7  mmHg. Aortic valve peak gradient measures 37.6 mmHg. Aortic valve area, by VTI measures 1.75 cm. There is a mechanical valve present in the aortic position. Pulmonic Valve: The pulmonic valve was normal in structure. Pulmonic valve regurgitation is not visualized. No evidence of pulmonic stenosis. Aorta: The aortic root is normal in size and structure. Venous: The inferior vena cava is normal in size with greater than 50% respiratory variability, suggesting right atrial pressure of 3 mmHg. IAS/Shunts: No atrial level shunt detected by color flow Doppler. Additional Comments: A device lead is visualized.  LEFT VENTRICLE PLAX 2D LVIDd:         4.10 cm   Diastology LVIDs:         2.80 cm   LV e' medial:    5.22 cm/s LV PW:         1.40 cm   LV E/e' medial:  16.4 LV IVS:        1.50 cm   LV e' lateral:   8.70 cm/s LVOT diam:     2.50 cm   LV E/e' lateral: 9.8 LV SV:         99 LV SV Index:   47 LVOT Area:     4.91 cm  RIGHT VENTRICLE RV Basal diam:  2.90 cm RV Mid diam:    2.50 cm RV S prime:     8.49 cm/s TAPSE (M-mode): 2.2 cm LEFT ATRIUM             Index        RIGHT ATRIUM  Index LA Vol (A2C):   36.2 ml 17.20 ml/m  RA Area:     12.70 cm LA Vol (A4C):   38.5 ml 18.30 ml/m  RA Volume:   29.70 ml  14.11 ml/m LA Biplane Vol: 37.8 ml 17.96 ml/m  AORTIC VALVE AV Area (Vmax):    1.84 cm AV Area (Vmean):   1.74 cm AV Area (VTI):     1.75 cm AV Vmax:           306.67 cm/s AV Vmean:          202.667 cm/s AV VTI:            0.564 m AV Peak Grad:      37.6 mmHg AV Mean Grad:      19.7 mmHg LVOT Vmax:         115.00 cm/s LVOT Vmean:        71.800 cm/s LVOT VTI:          0.201 m LVOT/AV VTI ratio: 0.36 MITRAL VALVE MV Area (PHT): 3.12 cm     SHUNTS MV Decel Time: 243 msec     Systemic VTI:  0.20 m MV E velocity: 85.40 cm/s   Systemic Diam: 2.50 cm MV A velocity: 118.00 cm/s MV E/A ratio:  0.72 Armanda Magic MD Electronically signed by Armanda Magic MD Signature Date/Time: 11/05/2022/5:41:11 PM    Final    VAS  [place] ABI WITH/WO TBI  Result Date: 11/05/2022  LOWER EXTREMITY DOPPLER STUDY Patient Name:  JHOANNA HELMINSKI  Date of Exam:   11/05/2022 Medical Rec #: [id number]      Accession #:    [id number] Date of Birth: [birth year]      Patient Gender: F Patient Age:   47 years Exam Location:  [facility] Procedure:      VAS [place] ABI WITH/WO TBI Referring Phys: ADAM MCDONALD --------------------------------------------------------------------------------  Indications: Ulceration. High Risk Factors: Hypertension, Diabetes.  Comparison Study: No prior studies. Performing Technologist: Olen Cordial RVT  Examination Guidelines: A complete evaluation includes at minimum, Doppler waveform signals and systolic blood pressure reading at the level of bilateral brachial, anterior tibial, and posterior tibial arteries, when vessel segments are accessible. Bilateral testing is considered an integral part of a complete examination. Photoelectric Plethysmograph (PPG) waveforms and toe systolic pressure readings are included as required and additional duplex testing as needed. Limited examinations for reoccurring indications may be performed as noted.  ABI Findings: +---------+------------------+-----+-----------+--------+ Right    Rt Pressure (mmHg)IndexWaveform   Comment  +---------+------------------+-----+-----------+--------+ Brachial 84                     triphasic           +---------+------------------+-----+-----------+--------+ PTA      90                0.93 multiphasic         +---------+------------------+-----+-----------+--------+ DP       106               1.09 multiphasic         +---------+------------------+-----+-----------+--------+ Great Toe29                0.30                     +---------+------------------+-----+-----------+--------+ +---------+------------------+-----+-----------+-------+ Left     Lt Pressure (mmHg)IndexWaveform   Comment  +---------+------------------+-----+-----------+-------+ Brachial 97  triphasic          +---------+------------------+-----+-----------+-------+ PTA      254               2.62 multiphasic        +---------+------------------+-----+-----------+-------+ DP       97                1.00 multiphasic        +---------+------------------+-----+-----------+-------+ Great Toe58                0.60                    +---------+------------------+-----+-----------+-------+ +-------+-----------+-----------+------------+------------+ ABI/TBIToday's ABIToday's TBIPrevious ABIPrevious TBI +-------+-----------+-----------+------------+------------+ Right  1.09       0.3                                 +-------+-----------+-----------+------------+------------+ Left   Greer         0.6                                 +-------+-----------+-----------+------------+------------+  Summary: Right: Resting right ankle-brachial index is within normal range. The right toe-brachial index is abnormal. Left: Resting left ankle-brachial index indicates noncompressible left lower extremity arteries. The left toe-brachial index is abnormal. *See table(s) above for measurements and observations.  Electronically signed by Lemar Livings MD on 11/05/2022 at 4:09:00 PM.    Final    CT HEAD WO CONTRAST ( )  Result Date: 11/05/2022 CLINICAL DATA:  Follow-up traumatic brain injury. EXAM: CT HEAD WITHOUT CONTRAST TECHNIQUE: Contiguous axial images were obtained from the base of the skull through the vertex without intravenous contrast. RADIATION DOSE REDUCTION: This exam was performed according to the departmental dose-optimization program which includes automated exposure control, adjustment of the mA and/or kV according to patient size and/or use of iterative reconstruction technique. COMPARISON:  Yesterday FINDINGS: Brain: Unchanged subarachnoid hemorrhage at the supra vermian and ambient  cisterns. No hydrocephalus. A small mainly low-density subdural hematoma along the left convexity is unchanged at up to 4 mm in thickness and without mass effect. No evidence of the acute infarct. 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 Vascular: High-density structures after recent enhanced CT. Skull: Large left parietal scalp hematoma without underlying fracture or opaque foreign body. Sinuses/Orbits: No evidence of injury IMPRESSION: 1. No progression of subdural and subarachnoid hemorrhage. No new abnormality. 2. Large left scalp hematoma without interval progression Electronically Signed   By: Tiburcio Pea M.D.   On: 11/05/2022 11:51   CT FOOT LEFT W CONTRAST  Result Date: 11/05/2022 CLINICAL DATA:  Left plantar foot wound. Evaluate for osteomyelitis. EXAM: CT OF THE LOWER LEFT EXTREMITY WITH CONTRAST TECHNIQUE: Multidetector CT imaging of the lower left extremity was performed according to the standard protocol following intravenous contrast administration. RADIATION DOSE REDUCTION: This exam was performed according to the departmental dose-optimization program which includes automated exposure control, adjustment of the mA and/or kV according to patient size and/or use of iterative reconstruction technique. CONTRAST:  75mL OMNIPAQUE IOHEXOL 350 MG/ML SOLN COMPARISON:  Left foot x-rays from yesterday. FINDINGS: Bones/Joint/Cartilage No bony destruction or periosteal reaction. No fracture or dislocation. Mid foot and great toe degenerative changes. No joint effusion. Ligaments Ligaments are suboptimally evaluated by CT. Muscles and Tendons Grossly intact. Complete fatty atrophy of the intrinsic foot muscles. Soft tissue Soft tissue  swelling and ulceration at plantar aspect of the first metatarsal head. No discrete fluid collection or subcutaneous emphysema. No soft tissue mass. IMPRESSION: 1. Soft tissue swelling and ulceration at the plantar aspect of the first metatarsal head. No CT evidence  of osteomyelitis. Electronically Signed   By: Obie Dredge M.D.   On: 11/05/2022 11:16   DG Foot 2 Views Left  Result Date: 11/04/2022 CLINICAL DATA:  Recent fall with foot pain, initial encounter EXAM: LEFT FOOT - 2 VIEW COMPARISON:  None Available. FINDINGS: Plantar arch is flattened. Tarsal degenerative changes are seen. Calcaneal spurs are noted. No acute fracture or dislocation is noted. IMPRESSION: Chronic changes without acute abnormality. Electronically Signed   By: Alcide Clever M.D.   On: 11/04/2022 02:55   DG Femur Min 2 Views Left  Result Date: 11/04/2022 CLINICAL DATA:  Recent fall with left leg pain, initial encounter EXAM: LEFT FEMUR 2 VIEWS COMPARISON:  None Available. FINDINGS: Degenerative changes of the hip joint are noted. No acute fracture or dislocation is noted. No soft tissue changes are seen. IMPRESSION: No acute abnormality noted. Electronically Signed   By: Alcide Clever M.D.   On: 11/04/2022 02:55   DG Wrist Complete Right  Result Date: 11/04/2022 CLINICAL DATA:  Recent fall with wrist pain, initial encounter EXAM: RIGHT WRIST - COMPLETE 3+ VIEW COMPARISON:  None Available. FINDINGS: No acute fracture or dislocation is noted. Mild soft tissue swelling is noted. Mild degenerative changes of the first [facility] joint are noted. 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 IMPRESSION: Mild soft tissue swelling without acute bony abnormality. Electronically Signed   By: Alcide Clever M.D.   On: 11/04/2022 02:53   DG Hip Unilat W or Wo Pelvis 2-3 Views Left  Result Date: 11/04/2022 CLINICAL DATA:  Recent fall on blood thinners with left hip pain, initial encounter EXAM: DG HIP (WITH OR WITHOUT PELVIS) 3V LEFT COMPARISON:  06/10/2022 FINDINGS: Pelvic ring is intact. Degenerative changes of the hip joints are noted bilaterally. Degenerative changes of lumbar spine are noted. No acute fracture or dislocation is seen. No soft tissue changes are noted. Degenerative change without  acute abnormality. IMPRESSION: Negative. Electronically Signed   By: Alcide Clever M.D.   On: 11/04/2022 02:51   CT HEAD WO CONTRAST ( )  Result Date: 11/04/2022 CLINICAL DATA:  Fall yesterday with headaches and neck pain, initial encounter EXAM: CT HEAD WITHOUT CONTRAST CT CERVICAL SPINE WITHOUT CONTRAST TECHNIQUE: Multidetector CT imaging of the head and cervical spine was performed following the standard protocol without intravenous contrast. Multiplanar CT image reconstructions of the cervical spine were also generated. RADIATION DOSE REDUCTION: This exam was performed according to the departmental dose-optimization program which includes automated exposure control, adjustment of the mA and/or kV according to patient size and/or use of iterative reconstruction technique. COMPARISON:  None Available. FINDINGS: CT HEAD FINDINGS Brain: There are changes consistent with subarachnoid hemorrhage surrounding the cerebral peduncles and pons posteriorly. Diffuse foci of subarachnoid hemorrhage are noted near the vertex on the left related to the localized injury. Small subdural hematoma is noted on the left as well near the vertex. Vascular: No hyperdense vessel or unexpected calcification. Skull: Normal. Negative for fracture or focal lesion. Sinuses/Orbits: No acute finding. Other: Large scalp hematoma is noted on the left consistent with the recent injury. This measures approximately 5.2 x 2.1 cm in greatest AP and transverse dimensions respectively. CT CERVICAL SPINE FINDINGS Alignment: Within normal limits. Skull base and vertebrae: Seven cervical segments are  well visualized. Vertebral body height is well maintained. No acute fracture or acute facet abnormality is noted. Multilevel osteophytic change and facet hypertrophic changes noted. The odontoid is within normal limits. Soft tissues and spinal canal: Surrounding soft tissue structures are within normal limits. Upper chest: Lung apices are unremarkable.  Other: None IMPRESSION: CT of the head: Changes consistent with the known history of recent injury with a large left scalp hematoma in the parietal region near the vertex. Areas of subarachnoid hemorrhage in the basilar cisterns as well as on the left near the vertex. Small subdural component is noted as well on the left near the vertex. CT of the cervical spine: Multilevel degenerative change without acute abnormality. Critical Value/emergent results were called by telephone at the time of interpretation on 11/04/2022 at 1:41 am to [facility] , who verbally acknowledged these results. Electronically Signed   By: Alcide Clever M.D.   On: 11/04/2022 01:42   CT Cervical Spine Wo Contrast  Result Date: 11/04/2022 CLINICAL DATA:  Fall yesterday with headaches and neck pain, initial encounter EXAM: CT HEAD WITHOUT CONTRAST CT CERVICAL SPINE WITHOUT CONTRAST TECHNIQUE: Multidetector CT imaging of the head and cervical spine was performed following the standard protocol without intravenous contrast. Multiplanar CT image reconstructions of the cervical spine were also generated. RADIATION DOSE REDUCTION: This exam was performed according to the departmental dose-optimization program which includes automated exposure control, adjustment of the mA and/or kV according to patient size and/or use of iterative reconstruction technique. COMPARISON:  None Available. FINDINGS: CT HEAD FINDINGS Brain: There are changes consistent with subarachnoid hemorrhage surrounding the cerebral peduncles and pons posteriorly. Diffuse foci of subarachnoid hemorrhage are noted near the vertex on the left related to the localized injury. Small subdural hematoma is noted on the left as well near the vertex. Vascular: No hyperdense vessel or unexpected calcification. Skull: Normal. Negative for fracture or focal lesion. Sinuses/Orbits: No acute finding. Other: Large scalp hematoma is noted on the left consistent with the recent injury. This  measures approximately 5.2 x 2.1 cm in greatest AP and transverse dimensions respectively. CT CERVICAL SPINE FINDINGS Alignment: Within normal limits. Skull base and vertebrae: Seven cervical segments are well visualized. Vertebral body height is well maintained. No acute fracture or acute facet abnormality is noted. Multilevel osteophytic change and facet hypertrophic changes noted. The odontoid is within normal limits. Soft tissues and spinal canal: Surrounding soft tissue structures are within normal limits. Upper chest: Lung apices are unremarkable. Other: None IMPRESSION: CT of the head: Changes consistent with the known history of recent injury with a large left scalp hematoma in the parietal region near the vertex. Areas of subarachnoid hemorrhage in the basilar cisterns as well as on the left near the vertex. Small subdural component is noted as well on the left near the vertex. CT of the cervical spine: Multilevel degenerative change without acute abnormality. Critical Value/emergent results were called by telephone at the time of interpretation on 11/04/2022 at 1:41 am to [facility] , who verbally acknowledged these results. Electronically Signed   By: Alcide Clever M.D.   On: 11/04/2022 01:42   DG Chest 2 View  Result Date: 11/03/2022 CLINICAL DATA:  Suspected sepsis.  Fall. EXAM: CHEST - 2 VIEW COMPARISON:  06/10/2022 FINDINGS: Patient's chin obscures the apices on the frontal view. Prior median sternotomy with prosthetic cardiac valve. Left-sided pacemaker in place. No focal airspace disease, pleural effusion, pulmonary edema or pneumothorax. Thoracic spondylosis. Safety pin projects over the left  clavicle. IMPRESSION: No acute chest findings. Electronically Signed   By: Narda Rutherford M.D.   On: 11/03/2022 21:42   DG Foot Complete Right  Result Date: 10/31/2022 Please see detailed radiograph report in office note.  CUP PACEART REMOTE DEVICE CHECK  Result Date: 10/23/2022 Scheduled  remote reviewed. Normal device function.  Next remote 91 days. LA, CVRS    Subjective: Patient seen examined bedside, resting calmly.  Sitting edge of bed.  Ready for discharge home today.  No specific complaints or concerns.  Discussed reduced dose of warfarin given started on amiodarone and supratherapeutic INR.  States has upcoming appointment this week with cardiology to repeat INR.  Also discussed needs close follow-up with her podiatrist for her diabetic foot infection and to continue previously prescribed Augmentin.  Denies headache, no dizziness, no chest pain, no palpitations, no visual changes, no fever/chills/night sweats, no nausea/vomiting/diarrhea, no shortness of breath, no abdominal pain, no focal weakness, no fatigue, no paresthesias.  No acute events overnight per nursing staff.  Discharge Exam: Vitals:   11/10/22 0542 11/10/22 0849  BP: (!) 161/75 136/64  Pulse:  73  Resp: 20   Temp: 98.5 F (36.9 C) 98.5 F (36.9 C)  SpO2:     Vitals:   11/09/22 2343 11/10/22 0506 11/10/22 0542 11/10/22 0849  BP: 107/86  (!) 161/75 136/64  Pulse: 69   73  Resp: 19 20 20    Temp:   98.5 F (36.9 C) 98.5 F (36.9 C)  TempSrc:   Oral Oral  SpO2:      Weight:  99.1 kg      Physical Exam: GEN: NAD, alert and oriented x 3, obese HEENT: NCAT, PERRL, EOMI, sclera clear, MMM PULM: CTAB w/o wheezes/crackles, normal respiratory effort, on room air CV: RRR w/o M/G/R GI: abd soft, NTND, NABS, no R/G/M MSK: no peripheral edema, muscle strength globally intact 5/5 bilateral upper/lower extremities NEURO: CN II-XII intact, no focal deficits, sensation to light touch intact PSYCH: normal mood/affect Integumentary: Left foot ulcer noted, dressing in place clean/dry/intact, otherwise no other concerning rashes/lesions/wounds noted on exposed skin surfaces.      The results of significant diagnostics from this hospitalization (including imaging, microbiology, ancillary and laboratory) are  listed below for reference.     Microbiology: Recent Results (from the past 240 hour(s))  Culture, blood (Routine x 2)     Status: None   Collection Time: 11/03/22  9:12 PM   Specimen: BLOOD  Result Value Ref Range Status   Specimen Description BLOOD BLOOD RIGHT ARM  Final   Special Requests   Final    BOTTLES DRAWN AEROBIC ONLY Blood Culture adequate volume   Culture   Final    NO GROWTH 5 DAYS Performed at [facility], [address], [place], [place] [address]    Report Status 11/08/2022 FINAL  Final  Culture, blood (Routine x 2)     Status: None   Collection Time: 11/03/22  9:16 PM   Specimen: BLOOD  Result Value Ref Range Status   Specimen Description BLOOD BLOOD LEFT ARM  Final   Special Requests   Final    BOTTLES DRAWN AEROBIC AND ANAEROBIC Blood Culture adequate volume   Culture   Final    NO GROWTH 5 DAYS Performed at [facility], [address], [place], [place] [address]    Report Status 11/08/2022 FINAL  Final  Aerobic/Anaerobic Culture w Gram Stain (surgical/deep wound)     Status: None (Preliminary  result)   Collection Time: 11/04/22  6:01 AM   Specimen: Other Source; Wound  Result Value Ref Range Status   Specimen Description OTHER  Final   Special Requests Foot, Left  Final   Gram Stain NO WBC SEEN NO ORGANISMS SEEN   Final   Culture   Final    NO GROWTH 4 DAYS NO ANAEROBES ISOLATED; CULTURE IN PROGRESS FOR 5 DAYS Performed at [facility], [address] [place], [place] [address]    Report Status PENDING  Incomplete     Labs: BNP (last 3 results) Recent Labs    06/10/22 2135  BNP 75.8   Basic Metabolic Panel: Recent Labs  Lab 11/06/22 0457 11/07/22 1024 11/08/22 0500 11/09/22 0351 11/10/22 0635  NA 134* 134* 132* 134* 132*  K 3.8 3.7 3.4* 3.6 3.7  CL 98 99 97* 95* 98  CO2 24 22 26 29 27   GLUCOSE 104* 127* 140* 117* 184*  BUN 13 11 15 13 16   CREATININE 0.82 0.74 0.86 0.81 1.26*  CALCIUM 8.4* 8.5* 8.5* 9.0 8.5*   MG 2.0 1.7 2.0 1.8 1.8   Liver Function Tests: Recent Labs  Lab 11/03/22 2116 11/04/22 0942 11/05/22 0419  AST 25 17 19   ALT 18 13 11   ALKPHOS 103 78 79  BILITOT 1.1 0.9 1.0  PROT 8.4* 6.5 7.1  ALBUMIN 3.5 2.6* 2.8*   No results for input(s): "LIPASE", "AMYLASE" in the last 168 hours. No results for input(s): "AMMONIA" in the last 168 hours. CBC: Recent Labs  Lab 11/03/22 2116 11/05/22 0419 11/06/22 0457 11/07/22 1024  WBC 11.7* 7.7 7.3 6.5  NEUTROABS 8.0*  --  4.1 4.2  HGB 11.7* 9.7* 9.9* 9.7*  HCT 36.8 31.4* 33.1* 31.5*  MCV 89.8 92.9 94.8 92.6  PLT 372 263 324 310   Cardiac Enzymes: Recent Labs  Lab 11/03/22 2116  CKTOTAL 98   BNP: Invalid input(s): "POCBNP" CBG: Recent Labs  Lab 11/09/22 0744 11/09/22 1136 11/09/22 1625 11/09/22 2115 11/10/22 0739  GLUCAP 110* 150* 225* 125* 148*   D-Dimer No results for input(s): "DDIMER" in the last 72 hours. Hgb A1c No results for input(s): "HGBA1C" in the last 72 hours. Lipid Profile No results for input(s): "CHOL", "HDL", "LDLCALC", "TRIG", "CHOLHDL", "LDLDIRECT" in the last 72 hours. Thyroid function studies No results for input(s): "TSH", "T4TOTAL", "T3FREE", "THYROIDAB" in the last 72 hours.  Invalid input(s): "FREET3" Anemia work up No results for input(s): "VITAMINB12", "FOLATE", "FERRITIN", "TIBC", "IRON", "RETICCTPCT" in the last 72 hours. Urinalysis    Component Value Date/Time   COLORURINE YELLOW 11/03/2022 2113   APPEARANCEUR HAZY (A) 11/03/2022 2113   LABSPEC 1.028 11/03/2022 2113   PHURINE 7.0 11/03/2022 2113   GLUCOSEU >=500 (A) 11/03/2022 2113   HGBUR NEGATIVE 11/03/2022 2113   BILIRUBINUR NEGATIVE 11/03/2022 2113   KETONESUR 20 (A) 11/03/2022 2113   PROTEINUR NEGATIVE 11/03/2022 2113   UROBILINOGEN 0.2 09/17/2013 2335   NITRITE NEGATIVE 11/03/2022 2113   LEUKOCYTESUR NEGATIVE 11/03/2022 2113   Sepsis Labs Recent Labs  Lab 11/03/22 2116 11/05/22 0419 11/06/22 0457  11/07/22 1024  WBC 11.7* 7.7 7.3 6.5   Microbiology Recent Results (from the past 240 hour(s))  Culture, blood (Routine x 2)     Status: None   Collection Time: 11/03/22  9:12 PM   Specimen: BLOOD  Result Value Ref Range Status   Specimen Description BLOOD BLOOD RIGHT ARM  Final   Special Requests   Final    BOTTLES  DRAWN AEROBIC ONLY Blood Culture adequate volume   Culture   Final    NO GROWTH 5 DAYS Performed at [facility], [address] [place], [place] [address]    Report Status 11/08/2022 FINAL  Final  Culture, blood (Routine x 2)     Status: None   Collection Time: 11/03/22  9:16 PM   Specimen: BLOOD  Result Value Ref Range Status   Specimen Description BLOOD BLOOD LEFT ARM  Final   Special Requests   Final    BOTTLES DRAWN AEROBIC AND ANAEROBIC Blood Culture adequate volume   Culture   Final    NO GROWTH 5 DAYS Performed at [facility], [address], [place], [place] [address]    Report Status 11/08/2022 FINAL  Final  Aerobic/Anaerobic Culture w Gram Stain (surgical/deep wound)     Status: None (Preliminary result)   Collection Time: 11/04/22  6:01 AM   Specimen: Other Source; Wound  Result Value Ref Range Status   Specimen Description OTHER  Final   Special Requests Foot, Left  Final   Gram Stain NO WBC SEEN NO ORGANISMS SEEN   Final   Culture   Final    NO GROWTH 4 DAYS NO ANAEROBES ISOLATED; CULTURE IN PROGRESS FOR 5 DAYS Performed at [facility], [address], [place], [place] [address]    Report Status PENDING  Incomplete     Time coordinating discharge: Over 30 minutes  SIGNED:   Alvira Philips [place], DO  [facility] 11/10/2022, 10:41 AM

## 2022-11-10 NOTE — TOC Transition Note (Signed)
Transition of Care [facility]) - CM/SW Discharge Note   Patient Details  Name: Donna Hopkins MRN: [id number] Date of Birth: 21-Dec-1955  [facility] CM/SW Contact:  Ronny Bacon, RN Phone Number: 11/10/2022, 12:25 PM   Clinical Narrative:   Patient is being discharged today. Katrina- [facility] made aware.    Final next level of care: Home w Home Health Services Barriers to Discharge: No Barriers Identified   Patient Goals and CMS Choice   Choice offered to / list presented to : Adult Children (no preference family wants agency in network.)  Discharge Placement                         Discharge Plan and Services Additional resources added to the After Visit Summary for   In-house Referral: NA Discharge Planning Services: CM Consult Post Acute Care Choice: Home Health            DME Agency: NA       HH Arranged: OT, PT HH Agency: [facility] Date [facility] Agency Contacted: 11/06/22 Time HH Agency Contacted: 1540 Representative spoke with at [facility] Agency: Tresa Endo  Social Determinants of Health (SDOH) Interventions SDOH Screenings   Food Insecurity: No Food Insecurity (11/05/2022)  Housing: Low Risk  (11/05/2022)  Transportation Needs: Unmet Transportation Needs (11/05/2022)  Utilities: Not At Risk (11/05/2022)  Financial Resource Strain: Medium Risk (06/10/2020)  Tobacco Use: Medium Risk (11/04/2022)     Readmission Risk Interventions     No data to display

## 2022-11-17 ENCOUNTER — Other Ambulatory Visit: Payer: Self-pay | Admitting: Podiatry

## 2022-11-19 ENCOUNTER — Ambulatory Visit (INDEPENDENT_AMBULATORY_CARE_PROVIDER_SITE_OTHER): Payer: 59 | Admitting: Podiatry

## 2022-11-19 DIAGNOSIS — Z91199 Patient's noncompliance with other medical treatment and regimen due to unspecified reason: Secondary | ICD-10-CM

## 2022-11-19 NOTE — Progress Notes (Signed)
Patient was no-show for appointment today 

## 2022-11-29 ENCOUNTER — Encounter: Payer: Self-pay | Admitting: Podiatry

## 2022-11-29 ENCOUNTER — Ambulatory Visit (INDEPENDENT_AMBULATORY_CARE_PROVIDER_SITE_OTHER): Payer: 59 | Admitting: Podiatry

## 2022-11-29 VITALS — BP 160/80 | HR 102 | Temp 97.1°F | Resp 20 | Ht 69.0 in | Wt 218.0 lb

## 2022-11-29 DIAGNOSIS — L97522 Non-pressure chronic ulcer of other part of left foot with fat layer exposed: Secondary | ICD-10-CM | POA: Diagnosis not present

## 2022-12-01 NOTE — Progress Notes (Unsigned)
Subjective:  Patient ID: Donna Hopkins, female    DOB: [birth year],  MRN: [id number]  Chief Complaint  Patient presents with   Wound Check    Ulcer of left foot with fat layer exposed (HCC)      67 y.o. female presents with the above complaint. History confirmed with patient.   She notes it  is doing better since leaving the hospital   Objective:  Physical Exam: warm, good capillary refill, no trophic changes or ulcerative lesions, weak DP and PT pulses, and abnormal sensory exam with loss of protective sensation at the toes, her left foot has a 8 mm x 8 mm x 3 mm ulceration with exposed subcutaneous tissue, there is no exposed bone tendon or joint or deep sinus tract.     New left foot radiographs taken today show no significant bony abnormalities soft tissue emphysema or erosions in the metatarsal or sesamoids  Assessment:   No diagnosis found.    Plan:  Patient was evaluated and treated and all questions answered.  Ulcer bilateral foot -Today she has acute worsening of her left foot wound with cellulitis and a diabetic foot infection developing.  We discussed possibility of worsening infection.  Fortunately no deep sinus tract or emergent needs, she is afebrile and has not had any systemic signs of infection at home.  We discussed possibility of developing these and if these develop she will proceed directly to the emergency room. -Start antibiotics, and Augmentin sent to pharmacy.  She often gets fluconazole for yeast infections when she has antibiotics and this was sent as well.  We discussed monitoring her INR closely with a single dose of fluconazole. -Debridement as below. -Dressed with Iodosorb, DSD. -Continue home dressing changes daily with 4 x 4 gauze and mupirocin ointment -Continue off-loading with surgical shoe. -Vascular testing pulses are weakly palpable and wound bed shows local good perfusion on debridement -HgbA1c: She still has not had her A1c  rechecked -Last antibiotics: Rx for Augmentin sent to pharmacy -Imaging: x-ray reviewed, shows no signs of erosions, osteolysis, osteomyelitis or emphysema.  Procedure: Excisional Debridement of Wound Rationale: Removal of non-viable soft tissue from the wound to promote healing.  Anesthesia: none Post-Debridement Wound Measurements: They are noted above Type of Debridement: Sharp Excisional Tissue Removed: Non-viable soft tissue Depth of Debridement: subcutaneous tissue. Technique: Sharp excisional debridement to bleeding, viable wound base.  Dressing: Dry, sterile, compression dressing. Disposition: Patient tolerated procedure well.        Return in about 1 month (around 12/29/2022) for wound care.

## 2022-12-03 ENCOUNTER — Ambulatory Visit: Payer: 59 | Attending: Cardiology

## 2022-12-03 DIAGNOSIS — I5022 Chronic systolic (congestive) heart failure: Secondary | ICD-10-CM

## 2022-12-03 DIAGNOSIS — Z95810 Presence of automatic (implantable) cardiac defibrillator: Secondary | ICD-10-CM

## 2022-12-04 ENCOUNTER — Telehealth: Payer: Self-pay

## 2022-12-04 NOTE — Progress Notes (Signed)
EPIC Encounter for ICM Monitoring  Patient Name: Donna Hopkins is a 67 y.o. female Date: 12/04/2022 Primary Care Physican: Dennard Schaumann, MD Primary Cardiologist: Hanley Hays Electrophysiologist: Townsend Roger Pacing: 98%         03/19/2022 Office Weight: 225 lbs   AT/AF Burden:  0% (takes Warfarin)                                                                  Attempted call to patient and unable to reach.  Transmission reviewed.    Transmission reviewed. Hospitalized for fall from 8/24-8/31.          Diet:  Does not follow low salt diet.  Eats a lot of ice and drinks > 64 oz daily   [place]vue Thoracic impedance suggesting possible fluid accumulation 9/14.   Prescribed: Furosemide 40 mg Take 1 tablet (40 mg total) by mouth twice a day   Potassium 20 mEq take 1 tablet daily.   Labs: 11/10/2022 Creatinine 1.26, BUN 16, Potassium 3.7, Sodium 132, GFR 46  11/09/2022 Creatinine 0.81, BUN 13, Potassium 3.6, Sodium 134, GFR >60  11/08/2022 Creatinine 0.86, BUN 15, Potassium 3.4, Sodium 132, GFR >60  11/07/2022 Creatinine 0.74, BUN 11, Potassium 3.7, Sodium 134, GFR >60 11/06/2022 Creatinine 0.82, BUN 13, Potassium 3.8, Sodium 134, GFR >60  11/05/2022 Creatinine 0.86, BUN 12, Potassium 3.8, Sodium 132, GFR >60  11/04/2022 Creatinine 0.90, BUN 11, Potassium 3.2, Sodium 132, GFR >60  11/03/2022 Creatinine 1.19, BUN 10, Potassium 3.2, Sodium 133, GFR 49  A complete set of results can be found in Results Review..   Recommendations:   Unable to reach.       Follow-up plan: ICM clinic phone appointment on 12/17/2022 to recheck fluid levels.   91 day device clinic remote transmission 01/21/2023     EP/Cardiology Office Visits:  03/12/2023 with Dr Lalla Brothers.     Copy of ICM check sent to Dr. Lalla Brothers.      3 month ICM trend: 12/03/2022.    12-14 Month ICM trend:     Karie Soda, RN 12/04/2022 7:37 AM

## 2022-12-04 NOTE — Telephone Encounter (Signed)
Remote ICM transmission received.  Attempted call to patient regarding ICM remote transmission and no answer.  

## 2022-12-10 NOTE — Progress Notes (Signed)
Called patient two times now shoes and inserts are in but cannot leave a message  Patient does have upcoming appt. With Dr Lilian Kapur on 10/22 but if wants shoes earlier will have to schedule with me  Donna Hopkins Cped, CFo, CFm

## 2022-12-12 ENCOUNTER — Ambulatory Visit: Payer: 59

## 2022-12-12 DIAGNOSIS — L089 Local infection of the skin and subcutaneous tissue, unspecified: Secondary | ICD-10-CM

## 2022-12-12 DIAGNOSIS — E1149 Type 2 diabetes mellitus with other diabetic neurological complication: Secondary | ICD-10-CM

## 2022-12-12 DIAGNOSIS — M2042 Other hammer toe(s) (acquired), left foot: Secondary | ICD-10-CM

## 2022-12-12 DIAGNOSIS — M2041 Other hammer toe(s) (acquired), right foot: Secondary | ICD-10-CM | POA: Diagnosis not present

## 2022-12-12 DIAGNOSIS — L84 Corns and callosities: Secondary | ICD-10-CM | POA: Diagnosis not present

## 2022-12-12 DIAGNOSIS — L97519 Non-pressure chronic ulcer of other part of right foot with unspecified severity: Secondary | ICD-10-CM

## 2022-12-12 DIAGNOSIS — E11628 Type 2 diabetes mellitus with other skin complications: Secondary | ICD-10-CM

## 2022-12-12 DIAGNOSIS — L97512 Non-pressure chronic ulcer of other part of right foot with fat layer exposed: Secondary | ICD-10-CM

## 2022-12-12 NOTE — Progress Notes (Signed)

## 2022-12-14 ENCOUNTER — Encounter: Payer: Self-pay | Admitting: Student

## 2022-12-14 ENCOUNTER — Ambulatory Visit: Payer: 59 | Attending: Student | Admitting: Student

## 2022-12-14 VITALS — BP 142/72 | HR 72 | Ht 69.0 in | Wt 214.0 lb

## 2022-12-14 DIAGNOSIS — I5022 Chronic systolic (congestive) heart failure: Secondary | ICD-10-CM | POA: Diagnosis not present

## 2022-12-14 DIAGNOSIS — I359 Nonrheumatic aortic valve disorder, unspecified: Secondary | ICD-10-CM | POA: Diagnosis not present

## 2022-12-14 DIAGNOSIS — I472 Ventricular tachycardia, unspecified: Secondary | ICD-10-CM

## 2022-12-14 DIAGNOSIS — Z95810 Presence of automatic (implantable) cardiac defibrillator: Secondary | ICD-10-CM

## 2022-12-14 LAB — CUP PACEART INCLINIC DEVICE CHECK
Battery Remaining Longevity: 21 mo
Brady Statistic RA Percent Paced: 0.05 %
Brady Statistic RV Percent Paced: 98 %
Date Time Interrogation Session: 20241004123115
HighPow Impedance: 68.625
Implantable Lead Connection Status: 753985
Implantable Lead Connection Status: 753985
Implantable Lead Connection Status: 753985
Implantable Lead Implant Date: 20140429
Implantable Lead Implant Date: 20140617
Implantable Lead Implant Date: 20200205
Implantable Lead Location: 753858
Implantable Lead Location: 753859
Implantable Lead Location: 753860
Implantable Pulse Generator Implant Date: 20200205
Lead Channel Impedance Value: 412.5 Ohm
Lead Channel Impedance Value: 475 Ohm
Lead Channel Impedance Value: 712.5 Ohm
Lead Channel Pacing Threshold Amplitude: 0.75 V
Lead Channel Pacing Threshold Amplitude: 0.75 V
Lead Channel Pacing Threshold Amplitude: 0.875 V
Lead Channel Pacing Threshold Amplitude: 1.625 V
Lead Channel Pacing Threshold Pulse Width: 0.5 ms
Lead Channel Pacing Threshold Pulse Width: 0.5 ms
Lead Channel Pacing Threshold Pulse Width: 0.5 ms
Lead Channel Pacing Threshold Pulse Width: 0.7 ms
Lead Channel Sensing Intrinsic Amplitude: 12 mV
Lead Channel Sensing Intrinsic Amplitude: 3.5 mV
Lead Channel Setting Pacing Amplitude: 2 V
Lead Channel Setting Pacing Amplitude: 2.5 V
Lead Channel Setting Pacing Amplitude: 2.75 V
Lead Channel Setting Pacing Pulse Width: 0.5 ms
Lead Channel Setting Pacing Pulse Width: 0.7 ms
Lead Channel Setting Sensing Sensitivity: 0.5 mV
Pulse Gen Serial Number: 9879509

## 2022-12-14 NOTE — Progress Notes (Signed)
Electrophysiology Office Note:   ID:  Donna Hopkins, DOB 05/03/1955, MRN [id number]  Primary Cardiologist: None Electrophysiologist: Lanier Prude, MD      History of Present Illness:   Donna Hopkins is a 67 y.o. female with h/o mechanical valve, carotid disease, CKD3, Chronic systolic CHF, CRT-D in situ, HTN, HLD, and pulmonary HTN seen today for post hospital follow up.    Admitted 8/24 - 11/10/2022 after fall. She tripped on a step at home and struck her head. She was found to have a subarachnoid hemorrhage and subdural hematoma. She also has a diabetic foot infection and is receiving IV antibiotics.  During her course she was also found to have slow, overall well tolerated VT in the setting of her acute illness. She was started on amiodarone after being paced out, and maintained NSR.   Since discharge from hospital the patient reports doing well overall. Still having some issue getting around, mostly due to some persistent vision changes she had. Otherwise,  she denies chest pain, palpitations, dyspnea, PND, orthopnea, nausea, vomiting, dizziness, syncope, edema, weight gain, or early satiety.   Review of systems complete and found to be negative unless listed in HPI.   EP Information / Studies Reviewed:    EKG is ordered today. Personal review as below.  EKG Interpretation Date/Time:  Friday December 14 2022 11:27:54 EDT Ventricular Rate:  72 PR Interval:  168 QRS Duration:  150 QT Interval:  476 QTC Calculation: 521 R Axis:   169  Text Interpretation: Atrial-sensed ventricular-paced rhythm Biventricular pacemaker detected Confirmed by Maxine Glenn [phone]) on 12/14/2022 12:31:26 PM    ICD Interrogation-  reviewed in detail today,  See PACEART report.  Device History: St. Jude BiV ICD implanted 2018, lead revision (abandoned lead) 04/2018 for CHF   Physical Exam:   VS:  BP (!) 142/72   Pulse 72   Ht 5\' 9"  (1.753 m)   Wt 214 lb (97.1 kg)   BMI 31.60 kg/m    Wt  Readings from Last 3 Encounters:  12/14/22 214 lb (97.1 kg)  11/29/22 218 lb (98.9 kg)  11/10/22 218 lb 8 oz (99.1 kg)     GEN: Well nourished, well developed in no acute distress NECK: No JVD; No carotid bruits CARDIAC: Regular rate and rhythm, no murmurs, rubs, gallops RESPIRATORY:  Clear to auscultation without rales, wheezing or rhonchi  ABDOMEN: Soft, non-tender, non-distended EXTREMITIES:  No edema; No deformity   ASSESSMENT AND PLAN:    Chronic systolic dysfunction s/p Abbott CRT-D  euvolemic today Stable on an appropriate medical regimen Normal ICD function See Pace Art report No changes today  Ventricular tachycardia No further Continue amiodarone 200 mg daily.  Surveillance labs today  S/p Mechanical valve Continue coumadin  Disposition:   Follow up with EP APP in 3 months   Signed, Graciella Freer, PA-C

## 2022-12-14 NOTE — Patient Instructions (Addendum)
Medication Instructions:  Your physician recommends that you continue on your current medications as directed. Please refer to the Current Medication list given to you today.  *If you need a refill on your cardiac medications before your next appointment, please call your pharmacy*  Lab Work: CMET, CBC, TSH, FreeT4 If you have labs (blood work) drawn today and your tests are completely normal, you will receive your results only by: MyChart Message (if you have MyChart) OR A paper copy in the mail If you have any lab test that is abnormal or we need to change your treatment, we will call you to review the results.  Follow-Up: At [facility], you and your health needs are our priority.  As part of our continuing mission to provide you with exceptional heart care, we have created designated Provider Care Teams.  These Care Teams include your primary Cardiologist (physician) and Advanced Practice Providers (APPs -  Physician Assistants and Nurse Practitioners) who all work together to provide you with the care you need, when you need it.  Your next appointment:   3 month(s)  Provider:   Casimiro Needle "Otilio Saber, PA-C

## 2022-12-15 LAB — CBC
Hematocrit: 37 % (ref 34.0–46.6)
Hemoglobin: 11.8 g/dL (ref 11.1–15.9)
MCH: 29.9 pg (ref 26.6–33.0)
MCHC: 31.9 g/dL (ref 31.5–35.7)
MCV: 94 fL (ref 79–97)
Platelets: 294 10*3/uL (ref 150–450)
RBC: 3.94 x10E6/uL (ref 3.77–5.28)
RDW: 14.3 % (ref 11.7–15.4)
WBC: 8.2 10*3/uL (ref 3.4–10.8)

## 2022-12-15 LAB — COMPREHENSIVE METABOLIC PANEL
ALT: 12 [IU]/L (ref 0–32)
AST: 23 [IU]/L (ref 0–40)
Albumin: 4 g/dL (ref 3.9–4.9)
Alkaline Phosphatase: 107 [IU]/L (ref 44–121)
BUN/Creatinine Ratio: 21 (ref 12–28)
BUN: 18 mg/dL (ref 8–27)
Bilirubin Total: 0.3 mg/dL (ref 0.0–1.2)
CO2: 24 mmol/L (ref 20–29)
Calcium: 9.2 mg/dL (ref 8.7–10.3)
Chloride: 99 mmol/L (ref 96–106)
Creatinine, Ser: 0.85 mg/dL (ref 0.57–1.00)
Globulin, Total: 3.3 g/dL (ref 1.5–4.5)
Glucose: 73 mg/dL (ref 70–99)
Potassium: 3.8 mmol/L (ref 3.5–5.2)
Sodium: 138 mmol/L (ref 134–144)
Total Protein: 7.3 g/dL (ref 6.0–8.5)
eGFR: 74 mL/min/{1.73_m2} (ref >59)

## 2022-12-15 LAB — T4, FREE: Free T4: 1.32 ng/dL (ref 0.82–1.77)

## 2022-12-15 LAB — TSH: TSH: 3.84 u[IU]/mL (ref 0.450–4.500)

## 2022-12-17 ENCOUNTER — Ambulatory Visit: Payer: 59 | Attending: Cardiology

## 2022-12-17 DIAGNOSIS — I5022 Chronic systolic (congestive) heart failure: Secondary | ICD-10-CM

## 2022-12-17 DIAGNOSIS — Z95810 Presence of automatic (implantable) cardiac defibrillator: Secondary | ICD-10-CM

## 2022-12-18 NOTE — Progress Notes (Signed)
EPIC Encounter for ICM Monitoring  Patient Name: Donna Hopkins is a 67 y.o. female Date: 12/18/2022 Primary Care Physican: Elinor Dodge, MD Primary Cardiologist: Hanley Hays Electrophysiologist: Townsend Roger Pacing: >99%         03/19/2022 Office Weight: 225 lbs   AT/AF Burden:  0% (takes Warfarin)                                                                  Spoke with patient and heart failure questions reviewed.  Transmission results reviewed.  Pt asymptomatic for fluid accumulation.   She has not taken Lasix for the past 2 days and discussed importance of taking as prescribed to decrease risk for hospitalization due to fluid retention.            Diet:  Does not follow low salt diet.  Eats a lot of ice and drinks > 64 oz daily   Corvue Thoracic impedance suggesting possible fluid accumulation starting 10/5.   Prescribed: Furosemide 80 mg Take 1 tablet (80 mg total) by mouth daily   Potassium 20 mEq take 1 tablet daily.   Labs: 12/14/2022 Creatinine 0.85, BUN 18, Potassium 3.8, Sodium 138, GFR 74 11/10/2022 Creatinine 1.26, BUN 16, Potassium 3.7, Sodium 132, GFR 46  11/09/2022 Creatinine 0.81, BUN 13, Potassium 3.6, Sodium 134, GFR >60  11/08/2022 Creatinine 0.86, BUN 15, Potassium 3.4, Sodium 132, GFR >60  11/07/2022 Creatinine 0.74, BUN 11, Potassium 3.7, Sodium 134, GFR >60 11/06/2022 Creatinine 0.82, BUN 13, Potassium 3.8, Sodium 134, GFR >60  11/05/2022 Creatinine 0.86, BUN 12, Potassium 3.8, Sodium 132, GFR >60  11/04/2022 Creatinine 0.90, BUN 11, Potassium 3.2, Sodium 132, GFR >60  11/03/2022 Creatinine 1.19, BUN 10, Potassium 3.2, Sodium 133, GFR 49  A complete set of results can be found in Results Review..   Recommendations:  She will take Lasix as prescribed and call Dr Rosario's office if she has any symptoms.     Follow-up plan: ICM clinic phone appointment on 12/24/2022 to recheck fluid levels.   91 day device clinic remote transmission 01/21/2023      EP/Cardiology Office Visits:  03/12/2023 with Dr Lalla Brothers.  03/18/2023 with Otilio Saber, PA   Copy of ICM check sent to Dr. Lalla Brothers.    3 month ICM trend: 12/17/2022.    12-14 Month ICM trend:     Karie Soda, RN 12/18/2022 3:44 PM

## 2022-12-24 ENCOUNTER — Ambulatory Visit: Payer: 59 | Attending: Cardiology

## 2022-12-24 DIAGNOSIS — Z95810 Presence of automatic (implantable) cardiac defibrillator: Secondary | ICD-10-CM

## 2022-12-24 DIAGNOSIS — I5022 Chronic systolic (congestive) heart failure: Secondary | ICD-10-CM

## 2022-12-26 NOTE — Progress Notes (Signed)
EPIC Encounter for ICM Monitoring  Patient Name: Donna Hopkins is a 67 y.o. female Date: 12/26/2022 Primary Care Physican: Elinor Dodge, MD Primary Cardiologist: Hanley Hays Electrophysiologist: Townsend Roger Pacing: >99%         03/19/2022 Office Weight: 225 lbs   AT/AF Burden:  0% (takes Warfarin)                                                                  Spoke with patient and heart failure questions reviewed.  Transmission results reviewed.  Pt asymptomatic for fluid accumulation.             Diet: Typically does not follow low salt diet and eats a lot of ice resulting in drinking > 64 oz daily   Corvue Thoracic impedance suggesting fluid levels returned to normal after taking Lasix as prescribed.   Prescribed: Furosemide 80 mg Take 1 tablet (80 mg total) by mouth daily   Potassium 20 mEq take 1 tablet daily.   Labs: 12/14/2022 Creatinine 0.85, BUN 18, Potassium 3.8, Sodium 138, GFR 74 11/10/2022 Creatinine 1.26, BUN 16, Potassium 3.7, Sodium 132, GFR 46  11/09/2022 Creatinine 0.81, BUN 13, Potassium 3.6, Sodium 134, GFR >60  11/08/2022 Creatinine 0.86, BUN 15, Potassium 3.4, Sodium 132, GFR >60  11/07/2022 Creatinine 0.74, BUN 11, Potassium 3.7, Sodium 134, GFR >60 11/06/2022 Creatinine 0.82, BUN 13, Potassium 3.8, Sodium 134, GFR >60  11/05/2022 Creatinine 0.86, BUN 12, Potassium 3.8, Sodium 132, GFR >60  11/04/2022 Creatinine 0.90, BUN 11, Potassium 3.2, Sodium 132, GFR >60  11/03/2022 Creatinine 1.19, BUN 10, Potassium 3.2, Sodium 133, GFR 49  A complete set of results can be found in Results Review..   Recommendations:  No changes and encouraged to call if experiencing any fluid symptoms.   Follow-up plan: ICM clinic phone appointment on 01/14/2023.   91 day device clinic remote transmission 01/21/2023     EP/Cardiology Office Visits:  03/12/2023 with Dr Lalla Brothers.  03/18/2023 with Otilio Saber, PA   Copy of ICM check sent to Dr. Lalla Brothers.    3 month ICM  trend: 12/24/2022.    12-14 Month ICM trend:     Karie Soda, RN 12/26/2022 10:36 AM

## 2023-01-01 ENCOUNTER — Ambulatory Visit (INDEPENDENT_AMBULATORY_CARE_PROVIDER_SITE_OTHER): Payer: 59 | Admitting: Podiatry

## 2023-01-01 VITALS — Ht 69.5 in | Wt 214.0 lb

## 2023-01-01 DIAGNOSIS — L84 Corns and callosities: Secondary | ICD-10-CM

## 2023-01-02 NOTE — Progress Notes (Signed)
Subjective:  Patient ID: Donna Hopkins, female    DOB: 02/13/1956,  MRN: [id number]  Chief Complaint  Patient presents with   Foot Ulcer    No changes. Some improvement. Some residual pain. Per nurse that comes out to home it is healing well.     67 y.o. female presents with the above complaint. History confirmed with patient.   She notes it  is still doing well she has not been on the foot much  Objective:  Physical Exam: warm, good capillary refill, no trophic changes or ulcerative lesions, weak DP and PT pulses, and abnormal sensory exam with loss of protective sensation at the toes, her left foot ulceration has fully healed there is hyperkeratosis present    New left foot radiographs taken today show no significant bony abnormalities soft tissue emphysema or erosions in the metatarsal or sesamoids  Assessment:   1. Callus of foot       Plan:  Patient was evaluated and treated and all questions answered.  Ulcer left foot Ulcer is doing well and is healed.  She received her diabetic shoes and may begin ambulating in the.  I advised her to bring these to her next appointment so that I can inspect them to see if there is further offloading it needs to be added.  Such as a dancers pad.  Also perform her at risk diabetic footcare at that visit.   Return in about 1 month (around 02/01/2023) for at risk diabetic foot care.

## 2023-01-14 ENCOUNTER — Ambulatory Visit: Payer: 59 | Attending: Cardiology

## 2023-01-14 ENCOUNTER — Ambulatory Visit: Payer: 59 | Admitting: Podiatry

## 2023-01-14 DIAGNOSIS — Z95810 Presence of automatic (implantable) cardiac defibrillator: Secondary | ICD-10-CM | POA: Diagnosis not present

## 2023-01-14 DIAGNOSIS — I5022 Chronic systolic (congestive) heart failure: Secondary | ICD-10-CM | POA: Diagnosis not present

## 2023-01-14 NOTE — Progress Notes (Signed)
EPIC Encounter for ICM Monitoring  Patient Name: Donna Hopkins is a 67 y.o. female Date: 01/14/2023 Primary Care Physican: Elinor Dodge, MD Primary Cardiologist: Hanley Hays Electrophysiologist: Townsend Roger Pacing: >99%         03/19/2022 Office Weight: 225 lbs 01/14/2023 Weight: Not weighing at home   AT/AF Burden:  0% (takes Warfarin)                                                                  Spoke with patient and heart failure questions reviewed.  Transmission results reviewed.  Pt asymptomatic for fluid accumulation.             Diet: Typically does not follow low salt diet and eats a lot of ice resulting in drinking > 64 oz daily   Corvue Thoracic impedance suggesting normal fluid levels within the last month but starting downward trend today.   Prescribed: Furosemide 80 mg Take 1 tablet (80 mg total) by mouth daily   Potassium 20 mEq take 1 tablet daily.   Labs: 12/14/2022 Creatinine 0.85, BUN 18, Potassium 3.8, Sodium 138, GFR 74 11/10/2022 Creatinine 1.26, BUN 16, Potassium 3.7, Sodium 132, GFR 46  11/09/2022 Creatinine 0.81, BUN 13, Potassium 3.6, Sodium 134, GFR >60  11/08/2022 Creatinine 0.86, BUN 15, Potassium 3.4, Sodium 132, GFR >60  11/07/2022 Creatinine 0.74, BUN 11, Potassium 3.7, Sodium 134, GFR >60 11/06/2022 Creatinine 0.82, BUN 13, Potassium 3.8, Sodium 134, GFR >60  11/05/2022 Creatinine 0.86, BUN 12, Potassium 3.8, Sodium 132, GFR >60  11/04/2022 Creatinine 0.90, BUN 11, Potassium 3.2, Sodium 132, GFR >60  11/03/2022 Creatinine 1.19, BUN 10, Potassium 3.2, Sodium 133, GFR 49  A complete set of results can be found in Results Review..   Recommendations:  Advised to limit fluid intake.  No changes and encouraged to call if experiencing any fluid symptoms.   Follow-up plan: ICM clinic phone appointment on 02/11/2023.   91 day device clinic remote transmission 01/21/2023     EP/Cardiology Office Visits:  03/12/2023 with Dr Lalla Brothers.  03/18/2023 with  Otilio Saber, PA   Copy of ICM check sent to Dr. Lalla Brothers.    3 month ICM trend: 01/14/2023.    12-14 Month ICM trend:     Karie Soda, RN 01/14/2023 3:32 PM

## 2023-01-21 ENCOUNTER — Ambulatory Visit (INDEPENDENT_AMBULATORY_CARE_PROVIDER_SITE_OTHER): Payer: 59

## 2023-01-21 DIAGNOSIS — I5022 Chronic systolic (congestive) heart failure: Secondary | ICD-10-CM | POA: Diagnosis not present

## 2023-01-21 DIAGNOSIS — I428 Other cardiomyopathies: Secondary | ICD-10-CM

## 2023-01-23 LAB — CUP PACEART REMOTE DEVICE CHECK
Battery Remaining Longevity: 22 mo
Battery Remaining Percentage: 30 %
Battery Voltage: 2.9 V
Brady Statistic AP VP Percent: 1 %
Brady Statistic AP VS Percent: 1 %
Brady Statistic AS VP Percent: 99 %
Brady Statistic AS VS Percent: 1 %
Brady Statistic RA Percent Paced: 1 %
Date Time Interrogation Session: 20241111020015
HighPow Impedance: 68 Ohm
HighPow Impedance: 68 Ohm
Implantable Lead Connection Status: 753985
Implantable Lead Connection Status: 753985
Implantable Lead Connection Status: 753985
Implantable Lead Implant Date: 20140429
Implantable Lead Implant Date: 20140617
Implantable Lead Implant Date: 20200205
Implantable Lead Location: 753858
Implantable Lead Location: 753859
Implantable Lead Location: 753860
Implantable Pulse Generator Implant Date: 20200205
Lead Channel Impedance Value: 440 Ohm
Lead Channel Impedance Value: 480 Ohm
Lead Channel Impedance Value: 740 Ohm
Lead Channel Pacing Threshold Amplitude: 0.75 V
Lead Channel Pacing Threshold Amplitude: 0.875 V
Lead Channel Pacing Threshold Amplitude: 1.625 V
Lead Channel Pacing Threshold Pulse Width: 0.5 ms
Lead Channel Pacing Threshold Pulse Width: 0.5 ms
Lead Channel Pacing Threshold Pulse Width: 0.7 ms
Lead Channel Sensing Intrinsic Amplitude: 12 mV
Lead Channel Sensing Intrinsic Amplitude: 3.3 mV
Lead Channel Setting Pacing Amplitude: 2 V
Lead Channel Setting Pacing Amplitude: 2.5 V
Lead Channel Setting Pacing Amplitude: 2.75 V
Lead Channel Setting Pacing Pulse Width: 0.5 ms
Lead Channel Setting Pacing Pulse Width: 0.7 ms
Lead Channel Setting Sensing Sensitivity: 0.5 mV
Pulse Gen Serial Number: 9879509

## 2023-01-28 ENCOUNTER — Ambulatory Visit (INDEPENDENT_AMBULATORY_CARE_PROVIDER_SITE_OTHER): Payer: 59 | Admitting: Podiatry

## 2023-01-28 ENCOUNTER — Encounter: Payer: Self-pay | Admitting: Podiatry

## 2023-01-28 DIAGNOSIS — B351 Tinea unguium: Secondary | ICD-10-CM | POA: Diagnosis not present

## 2023-01-28 DIAGNOSIS — M79675 Pain in left toe(s): Secondary | ICD-10-CM | POA: Diagnosis not present

## 2023-01-28 DIAGNOSIS — L97521 Non-pressure chronic ulcer of other part of left foot limited to breakdown of skin: Secondary | ICD-10-CM | POA: Diagnosis not present

## 2023-01-28 DIAGNOSIS — M79674 Pain in right toe(s): Secondary | ICD-10-CM

## 2023-01-29 ENCOUNTER — Telehealth: Payer: Self-pay | Admitting: Podiatry

## 2023-01-29 MED ORDER — CEPHALEXIN 500 MG PO CAPS: 500.0000 mg | ORAL_CAPSULE | Freq: Three times a day (TID) | ORAL | 0 refills | Status: AC

## 2023-01-29 MED ORDER — FLUCONAZOLE 150 MG PO TABS: 150.0000 mg | ORAL_TABLET | Freq: Once | ORAL | 0 refills | Status: AC

## 2023-01-29 NOTE — Telephone Encounter (Signed)
Patient called asking for antibiotics she would like them sent to [facility]. Pt states she needs a yeast pill also.

## 2023-01-30 ENCOUNTER — Encounter: Payer: Self-pay | Admitting: Podiatry

## 2023-01-30 NOTE — Progress Notes (Signed)
  Subjective:  Patient ID: Marionette Misa, female    DOB: 1955/08/19,  MRN: [id number]  Chief Complaint  Patient presents with   Foot Ulcer    RM#10 Follow up on left ulcer patient states she has had frequent falls at home due to stairs she is concerned. Left foot swelling.    67 y.o. female presents with the above complaint. History confirmed with patient.     Objective:  Physical Exam: warm, good capillary refill, no trophic changes or ulcerative lesions, weak DP and PT pulses, and abnormal sensory exam with loss of protective sensation at the toes, her left foot ulceration submetatarsal 1 has healed she has a new ulcer on the tip of the third toe no signs of infection there is overlying hyperkeratosis with partial thickness skin breakdown.  Thickened elongated dystrophic toenails x 9  New left foot radiographs taken today show no significant bony abnormalities soft tissue emphysema or erosions in the metatarsal or sesamoids  Assessment:   1. Ulcer of toe of left foot, limited to breakdown of skin (HCC)   2. Pain due to onychomycosis of toenails of both feet        Plan:  Patient was evaluated and treated and all questions answered.  Ulcer left foot Her previous ulcer is healed but has a new ulcer at the tip of the third toe.  I recommended local wound care and use of mupirocin ointment which she has at home.  Rx for Keflex and fluconazole x 1 sent to pharmacy she typically gets yeast infections advised her to have her INR closely monitored with the fluconazole dosing.  Discussed signs of infection and she will let me know if this develops.  Return in 3 weeks for ongoing wound care of the third toe.  Discussed the etiology and treatment options for the condition in detail with the patient. Recommended debridement of the nails today. Sharp and mechanical debridement performed of all painful and mycotic nails today. Nails debrided in length and thickness using a nail nipper to level of  comfort. Discussed treatment options including appropriate shoe gear. Follow up as needed for painful nails.     Return in about 3 weeks (around 02/18/2023) for wound care.

## 2023-02-11 ENCOUNTER — Ambulatory Visit: Payer: 59 | Attending: Cardiology

## 2023-02-11 DIAGNOSIS — Z95810 Presence of automatic (implantable) cardiac defibrillator: Secondary | ICD-10-CM

## 2023-02-11 DIAGNOSIS — I5022 Chronic systolic (congestive) heart failure: Secondary | ICD-10-CM | POA: Diagnosis not present

## 2023-02-15 ENCOUNTER — Telehealth: Payer: Self-pay

## 2023-02-15 NOTE — Telephone Encounter (Signed)
Remote ICM transmission received.  Attempted call to patient regarding ICM remote transmission and no answer.  

## 2023-02-15 NOTE — Progress Notes (Signed)
Remote ICD transmission.   

## 2023-02-15 NOTE — Progress Notes (Signed)
EPIC Encounter for ICM Monitoring  Patient Name: Donna Hopkins is a 67 y.o. female Date: 02/15/2023 Primary Care Physican: Elinor Dodge, MD Primary Cardiologist: Hanley Hays Electrophysiologist: Townsend Roger Pacing: >99%         03/19/2022 Office Weight: 225 lbs 01/14/2023 Weight: Not weighing at home   AT/AF Burden:  0% (takes Warfarin)                                                                  Attempted call to patient and unable to reach.    Transmission results reviewed.              Diet: Typically does not follow low salt diet and eats a lot of ice resulting in drinking > 64 oz daily   Corvue Thoracic impedance suggesting normal fluid levels within the last month.   Prescribed: Furosemide 80 mg Take 1 tablet (80 mg total) by mouth daily   Potassium 20 mEq take 1 tablet daily.   Labs: 12/14/2022 Creatinine 0.85, BUN 18, Potassium 3.8, Sodium 138, GFR 74 11/10/2022 Creatinine 1.26, BUN 16, Potassium 3.7, Sodium 132, GFR 46  11/09/2022 Creatinine 0.81, BUN 13, Potassium 3.6, Sodium 134, GFR >60  11/08/2022 Creatinine 0.86, BUN 15, Potassium 3.4, Sodium 132, GFR >60  11/07/2022 Creatinine 0.74, BUN 11, Potassium 3.7, Sodium 134, GFR >60 11/06/2022 Creatinine 0.82, BUN 13, Potassium 3.8, Sodium 134, GFR >60  11/05/2022 Creatinine 0.86, BUN 12, Potassium 3.8, Sodium 132, GFR >60  11/04/2022 Creatinine 0.90, BUN 11, Potassium 3.2, Sodium 132, GFR >60  11/03/2022 Creatinine 1.19, BUN 10, Potassium 3.2, Sodium 133, GFR 49  A complete set of results can be found in Results Review..   Recommendations:  Unable to reach.     Follow-up plan: ICM clinic phone appointment on 03/18/2023.   91 day device clinic remote transmission 04/22/2023.     EP/Cardiology Office Visits:   03/18/2023 with Otilio Saber, PA   Copy of ICM check sent to Dr. Lalla Brothers.    3 month ICM trend: 02/11/2023.    12-14 Month ICM trend:     Karie Soda, RN 02/15/2023 7:47 AM

## 2023-02-19 ENCOUNTER — Ambulatory Visit: Payer: 59 | Admitting: Podiatry

## 2023-03-11 ENCOUNTER — Ambulatory Visit: Payer: 59 | Admitting: Podiatry

## 2023-03-12 ENCOUNTER — Encounter: Payer: 59 | Admitting: Cardiology

## 2023-03-18 ENCOUNTER — Ambulatory Visit: Payer: 59 | Attending: Student | Admitting: Student

## 2023-03-18 ENCOUNTER — Ambulatory Visit: Payer: 59

## 2023-03-18 DIAGNOSIS — Z95810 Presence of automatic (implantable) cardiac defibrillator: Secondary | ICD-10-CM

## 2023-03-18 DIAGNOSIS — I5022 Chronic systolic (congestive) heart failure: Secondary | ICD-10-CM

## 2023-03-18 NOTE — Progress Notes (Deleted)
  Electrophysiology Office Note:   ID:  Donna Hopkins, DOB Aug 09, 1955, MRN [id number]  Primary Cardiologist: None Electrophysiologist: Lanier Prude, MD  {Click to update primary MD,subspecialty MD or APP then REFRESH:1}    History of Present Illness:   Donna Hopkins is a 68 y.o. female with h/o h/o mechanical valve, carotid disease, CKD3, Chronic systolic CHF, CRT-D in situ, HTN, HLD, and pulmonary HTN  seen today for routine electrophysiology followup.   Since last being seen in our clinic the patient reports doing ***.  she denies chest pain, palpitations, dyspnea, PND, orthopnea, nausea, vomiting, dizziness, syncope, edema, weight gain, or early satiety.   Review of systems complete and found to be negative unless listed in HPI.   EP Information / Studies Reviewed:    EKG is ordered today. Personal review as below.       ICD Interrogation-  reviewed in detail today,  See PACEART report.  Device History: St. Jude BiV ICD implanted 2018, lead revision (abandoned lead) 04/2018 for CHF   Echo 10/2022 LVEF 60-65%, grade 1 DD, normal RV, stable mechanical AV  Physical Exam:   VS:  There were no vitals taken for this visit.   Wt Readings from Last 3 Encounters:  01/01/23 214 lb (97.1 kg)  12/14/22 214 lb (97.1 kg)  11/29/22 218 lb (98.9 kg)     GEN: Well nourished, well developed in no acute distress NECK: No JVD; No carotid bruits CARDIAC: {EPRHYTHM:28826}, no murmurs, rubs, gallops RESPIRATORY:  Clear to auscultation without rales, wheezing or rhonchi  ABDOMEN: Soft, non-tender, non-distended EXTREMITIES:  No edema; No deformity   ASSESSMENT AND PLAN:    Chronic systolic CHF  s/p Abbott CRT-D  euvolemic today Stable on an appropriate medical regimen Normal ICD function See Pace Art report No changes today  Ventricular tachycardia No further Continue amiodarone 200 mg daily Surveillance labs today.   S/p Mechanical Aortic Valve Continue coumadin  Disposition:    Follow up with Dr. Lalla Brothers in 6 months   Signed, Graciella Freer, PA-C

## 2023-03-19 ENCOUNTER — Encounter: Payer: Self-pay | Admitting: Student

## 2023-03-20 NOTE — Progress Notes (Signed)
 EPIC Encounter for ICM Monitoring  Patient Name: Donna Hopkins is a 68 y.o. female Date: 03/20/2023 Primary Care Physican: Charlette Erla LABOR, MD Primary Cardiologist: Licia Electrophysiologist: Cindie Pore Pacing: >99%         03/19/2022 Office Weight: 225 lbs 01/14/2023 Weight: Not weighing at home   AT/AF Burden:  0% (takes Warfarin)                                                                  Transmission results reviewed.              Diet: Typically does not follow low salt diet and eats a lot of ice resulting in drinking > 64 oz daily   Corvue Thoracic impedance suggesting normal fluid levels within the last month.   Prescribed: Furosemide  80 mg Take 1 tablet (80 mg total) by mouth daily   Potassium 20 mEq take 1 tablet daily.   Labs: 12/14/2022 Creatinine 0.85, BUN 18, Potassium 3.8, Sodium 138, GFR 74 11/10/2022 Creatinine 1.26, BUN 16, Potassium 3.7, Sodium 132, GFR 46  11/09/2022 Creatinine 0.81, BUN 13, Potassium 3.6, Sodium 134, GFR >60  11/08/2022 Creatinine 0.86, BUN 15, Potassium 3.4, Sodium 132, GFR >60  11/07/2022 Creatinine 0.74, BUN 11, Potassium 3.7, Sodium 134, GFR >60 11/06/2022 Creatinine 0.82, BUN 13, Potassium 3.8, Sodium 134, GFR >60  11/05/2022 Creatinine 0.86, BUN 12, Potassium 3.8, Sodium 132, GFR >60  11/04/2022 Creatinine 0.90, BUN 11, Potassium 3.2, Sodium 132, GFR >60  11/03/2022 Creatinine 1.19, BUN 10, Potassium 3.2, Sodium 133, GFR 49  A complete set of results can be found in Results Review..   Recommendations:  No changes.   Follow-up plan: ICM clinic phone appointment on 04/23/2023.   91 day device clinic remote transmission 04/22/2023.     EP/Cardiology Office Visits:   04/03/2023 with Jodie Passey, PA   Copy of ICM check sent to Dr. Cindie.    3 month ICM trend: 03/18/2023.    12-14 Month ICM trend:     Mitzie GORMAN Garner, RN 03/20/2023 4:33 PM

## 2023-03-28 ENCOUNTER — Ambulatory Visit (INDEPENDENT_AMBULATORY_CARE_PROVIDER_SITE_OTHER): Payer: 59

## 2023-03-28 ENCOUNTER — Encounter: Payer: Self-pay | Admitting: Podiatry

## 2023-03-28 ENCOUNTER — Ambulatory Visit (INDEPENDENT_AMBULATORY_CARE_PROVIDER_SITE_OTHER): Payer: 59 | Admitting: Podiatry

## 2023-03-28 DIAGNOSIS — M79674 Pain in right toe(s): Secondary | ICD-10-CM

## 2023-03-28 DIAGNOSIS — L03116 Cellulitis of left lower limb: Secondary | ICD-10-CM | POA: Diagnosis not present

## 2023-03-28 DIAGNOSIS — L97523 Non-pressure chronic ulcer of other part of left foot with necrosis of muscle: Secondary | ICD-10-CM | POA: Diagnosis not present

## 2023-03-28 DIAGNOSIS — L97521 Non-pressure chronic ulcer of other part of left foot limited to breakdown of skin: Secondary | ICD-10-CM | POA: Diagnosis not present

## 2023-03-28 DIAGNOSIS — B351 Tinea unguium: Secondary | ICD-10-CM

## 2023-03-28 DIAGNOSIS — M79675 Pain in left toe(s): Secondary | ICD-10-CM | POA: Diagnosis not present

## 2023-03-28 DIAGNOSIS — M2042 Other hammer toe(s) (acquired), left foot: Secondary | ICD-10-CM

## 2023-03-28 DIAGNOSIS — E1149 Type 2 diabetes mellitus with other diabetic neurological complication: Secondary | ICD-10-CM

## 2023-03-28 DIAGNOSIS — I739 Peripheral vascular disease, unspecified: Secondary | ICD-10-CM

## 2023-03-28 MED ORDER — MUPIROCIN 2 % EX OINT: 1.0000 | TOPICAL_OINTMENT | Freq: Two times a day (BID) | CUTANEOUS | 2 refills | Status: DC

## 2023-03-28 MED ORDER — FLUCONAZOLE 150 MG PO TABS
150.0000 mg | ORAL_TABLET | Freq: Once | ORAL | 0 refills | Status: AC
Start: 2023-03-28 — End: 2023-03-28

## 2023-03-28 MED ORDER — DOXYCYCLINE HYCLATE 100 MG PO TABS
100.0000 mg | ORAL_TABLET | Freq: Two times a day (BID) | ORAL | 0 refills | Status: DC
Start: 2023-03-28 — End: 2023-04-05

## 2023-03-31 LAB — WOUND CULTURE
MICRO NUMBER:: 15964726
SPECIMEN QUALITY:: ADEQUATE

## 2023-04-01 ENCOUNTER — Encounter: Payer: Self-pay | Admitting: Podiatry

## 2023-04-01 NOTE — Progress Notes (Signed)
Subjective:  Patient ID: Donna Hopkins, female    DOB: 1955-07-18,  MRN: [id number]  Chief Complaint  Patient presents with   Foot Ulcer    Follow up ulcer 3rd toe left   "I'm probably about to lose this toe because I got the appointment wrong the last time. It doesn't look too good"    68 y.o. female presents with the above complaint. History confirmed with patient.   She states she missed her last appointment with [name] that the left third toe has worsened and is more swollen today.  She is concerned with its appearance.  Endorses redness.  She also presents with painful, thickened, elongated toenails that she is unable to maintain herself due to their dystrophic appearance.  She does endorse diabetes.  History of PAD.  Objective:  Physical Exam: warm, good capillary refill, no trophic changes or ulcerative lesions, weak DP and PT pulses, and abnormal sensory exam with loss of protective sensation at the toes.  Worsening ulceration left third toe that is underlying a preulcerative callus.  Ulceration present to the distal plantar tuft of the toe that does probe to capsule measuring 0.5 x 0.4 x 0.4 cm, there is also area of ulceration medial toe over distal phalanx measuring 1 x 0.5 by 0.3 cm with fat layer exposed postdebridement.  Serous drainage present.  Increase edema and erythema to the left third toe, no malodor.  Thickened elongated dystrophic toenails x 9 painful on direct dorsal palpation. Hammertoe contractures appreciated, semireducible  New left foot radiographs taken today show no fractures, no soft tissue emphysema.  Questionable cortical erosion left third distal phalanx in the AP and oblique view without obvious osteolysis or bony destruction.  Assessment:   1. Ulcer of toe of left foot, with necrosis of muscle (HCC)   2. Cellulitis of left foot   3. Hammertoe of left foot   4. Type II diabetes mellitus with neurological manifestations [facility])        Plan:   Patient was evaluated and treated and all questions answered.  # Left third toe ulcer with necrosis of muscle, associated cellulitis She has had worsening of the left third toe ulcer.  After verbal consent was obtained, the ulceration was excisionally debrided using 15 blade removing all devitalized skin and soft tissue to the medial aspect, skin and soft tissue, devitalized muscle fascial tissue distally.  Betadine dressing applied. -Discussed with patient he is at high risk of developing osteomyelitis and at high risk of ulceration - Started patient on oral doxycycline with dose of fluconazole as she has had history of yeast infections with use of antibiotics. -Patient to apply mupirocin to the wound twice daily.  # Associated hammertoe contracture -Biomechanically this is causing the ulceration to the tip of her toe -Crest pads dispensed today -I recommend we plan for an office flexor tenotomy in 1 week.  Risk, benefits alternative therapies were discussed with the patient and she is agreeable to this.  Obtain written consent next week -I certify that this diagnosis represents a distinct and separate diagnosis that requires evaluation and treatment separate from other procedures or diagnosis    Discussed the etiology and treatment options for the condition in detail with the patient. Recommended debridement of the nails today. Sharp and mechanical debridement performed of all painful and mycotic nails today. Nails debrided in length and thickness using a nail nipper to level of comfort. Discussed treatment options including appropriate shoe gear. Follow up as needed for painful  nails.     Return in about 1 week (around 04/04/2023) for Wound Care, Flexor Tenotomy.

## 2023-04-02 NOTE — Progress Notes (Deleted)
  Electrophysiology Office Note:   ID:  Donna Hopkins, DOB Aug 09, 1955, MRN [id number]  Primary Cardiologist: None Electrophysiologist: Lanier Prude, MD  {Click to update primary MD,subspecialty MD or APP then REFRESH:1}    History of Present Illness:   Donna Hopkins is a 68 y.o. female with h/o h/o mechanical valve, carotid disease, CKD3, Chronic systolic CHF, CRT-D in situ, HTN, HLD, and pulmonary HTN  seen today for routine electrophysiology followup.   Since last being seen in our clinic the patient reports doing ***.  she denies chest pain, palpitations, dyspnea, PND, orthopnea, nausea, vomiting, dizziness, syncope, edema, weight gain, or early satiety.   Review of systems complete and found to be negative unless listed in HPI.   EP Information / Studies Reviewed:    EKG is ordered today. Personal review as below.       ICD Interrogation-  reviewed in detail today,  See PACEART report.  Device History: St. Jude BiV ICD implanted 2018, lead revision (abandoned lead) 04/2018 for CHF   Echo 10/2022 LVEF 60-65%, grade 1 DD, normal RV, stable mechanical AV  Physical Exam:   VS:  There were no vitals taken for this visit.   Wt Readings from Last 3 Encounters:  01/01/23 214 lb (97.1 kg)  12/14/22 214 lb (97.1 kg)  11/29/22 218 lb (98.9 kg)     GEN: Well nourished, well developed in no acute distress NECK: No JVD; No carotid bruits CARDIAC: {EPRHYTHM:28826}, no murmurs, rubs, gallops RESPIRATORY:  Clear to auscultation without rales, wheezing or rhonchi  ABDOMEN: Soft, non-tender, non-distended EXTREMITIES:  No edema; No deformity   ASSESSMENT AND PLAN:    Chronic systolic CHF  s/p Abbott CRT-D  euvolemic today Stable on an appropriate medical regimen Normal ICD function See Pace Art report No changes today  Ventricular tachycardia No further Continue amiodarone 200 mg daily Surveillance labs today.   S/p Mechanical Aortic Valve Continue coumadin  Disposition:    Follow up with Dr. Lalla Brothers in 6 months   Signed, Graciella Freer, PA-C

## 2023-04-03 ENCOUNTER — Ambulatory Visit: Payer: 59 | Admitting: Student

## 2023-04-03 DIAGNOSIS — I472 Ventricular tachycardia, unspecified: Secondary | ICD-10-CM

## 2023-04-03 DIAGNOSIS — I5022 Chronic systolic (congestive) heart failure: Secondary | ICD-10-CM

## 2023-04-03 DIAGNOSIS — I359 Nonrheumatic aortic valve disorder, unspecified: Secondary | ICD-10-CM

## 2023-04-03 DIAGNOSIS — Z95810 Presence of automatic (implantable) cardiac defibrillator: Secondary | ICD-10-CM

## 2023-04-05 ENCOUNTER — Encounter: Payer: Self-pay | Admitting: Podiatry

## 2023-04-05 ENCOUNTER — Telehealth: Payer: Self-pay | Admitting: Podiatry

## 2023-04-05 ENCOUNTER — Ambulatory Visit (INDEPENDENT_AMBULATORY_CARE_PROVIDER_SITE_OTHER): Payer: 59

## 2023-04-05 ENCOUNTER — Ambulatory Visit (INDEPENDENT_AMBULATORY_CARE_PROVIDER_SITE_OTHER): Payer: 59 | Admitting: Podiatry

## 2023-04-05 DIAGNOSIS — L03116 Cellulitis of left lower limb: Secondary | ICD-10-CM

## 2023-04-05 DIAGNOSIS — M869 Osteomyelitis, unspecified: Secondary | ICD-10-CM | POA: Diagnosis not present

## 2023-04-05 MED ORDER — DOXYCYCLINE HYCLATE 100 MG PO TABS: 100.0000 mg | ORAL_TABLET | Freq: Two times a day (BID) | ORAL | 0 refills | Status: DC

## 2023-04-05 MED ORDER — DOXYCYCLINE HYCLATE 100 MG PO TABS: 100.0000 mg | ORAL_TABLET | Freq: Two times a day (BID) | ORAL | 0 refills | Status: AC

## 2023-04-05 NOTE — Progress Notes (Unsigned)
  Subjective:  Patient ID: Donna Hopkins, female    DOB: 07-25-1955,  MRN: [id number]  Chief Complaint  Patient presents with   Foot Ulcer    LT 3rd. Some redness but no drainage. No pain, no swelling. Patient says that she "feels like it is getting better"     68 y.o. female presents with the above complaint. Returns for follow up for left 3rd toe wound and infection. Wounds appear healed, however there is continued swelling to the toe. It is less painful today. She does endorse diabetes.  History of PAD.  Objective:  Physical Exam: warm, good capillary refill, no trophic changes or ulcerative lesions, weak DP and PT pulses, and abnormal sensory exam with loss of protective sensation at the toes.  Ulceration left 3rd toe appears resolved. There is continued edema and erythema to the left third toe, edema appears more severe today. Hammertoe contractures appreciated, semireducible  New left foot radiographs taken today show no fractures, no soft tissue emphysema.  Cortical erosion again appreciated there appears to be some subtle osteolysis from previous study on 03/27/22.  Assessment:   1. Cellulitis of left foot   2. Osteomyelitis of toe of left foot (HCC)        Plan:  Patient was evaluated and treated and all questions answered.  # Left third toe cellulitis, concern for osteomyelitis. -Radiographs reviewed with patient.  Did discuss concern for osseous changes suggestive of osteomyelitis -Discussed in office tenotomy procedure with long-term antibiotics versus partial amputation of the toe.  Discussed that amputation of the toe more definitive at this point, and would hopefully avoid longterm antibiotics. -Patient did have ABIs back in August which demonstrated triphasic pulses however the vessels were noncompressible. -Patient expressed understanding.  She is agreeable to proceeding with partial amputation of the left third toe.  Risk, benefits and alternative therapies were  discussed at length.  Written consent obtained. Surgery scheduling to work with patient we will aim for next week at the surgery center -Extending patient's doxycycline -Discussed signs and symptoms of worsening infection with patient.  She understands that she needs to go to the hospital if these occur.   No follow-ups on file.

## 2023-04-05 NOTE — Telephone Encounter (Signed)
DOS-04/10/23  AMPUTATION TOE IPJ 3RD [id number]  [place] EFFECTIVE DATE-03/13/23  DEDUCTIBLE- $257.00 WITH REMAINING $0.00 OOP-$9350.00 WITH REMAINING $9075.24 COINSURANCE- 20%  PER THE [employer] PORTAL, PRIOR AUTH IS NOT REQUIRED FOR CPT CODE 13086.  AUTH Decision ID #: [id number]

## 2023-04-09 ENCOUNTER — Telehealth: Payer: Self-pay | Admitting: Podiatry

## 2023-04-09 ENCOUNTER — Telehealth: Payer: Self-pay | Admitting: Urology

## 2023-04-09 ENCOUNTER — Telehealth: Payer: Self-pay | Admitting: Cardiology

## 2023-04-09 NOTE — Telephone Encounter (Signed)
Pt called yesterday at 506pm and left message she was returning a call to schedule surgery and wanted to wait until next week for the surgery as she has appts this week.

## 2023-04-09 NOTE — Telephone Encounter (Signed)
I received a message from [facility] that she needs to be moved to the hospital due to having a defibrillator, I called and spoke with the daughter to let her know that sx on 04/10/23 with Dr. Jamse Arn has been cxled that we will get her rescheduled at the hospital.

## 2023-04-09 NOTE — Telephone Encounter (Signed)
Spoke to patient who had question if her device was still working as she has not had any symptoms like previously. Remote transmission checked and appears to work in normal function with parameters provided. 2 PMT events logged, otherwise no alerts noted. In NSR w/ Bi-V pacing 99%. Pt appreciative of assistance.

## 2023-04-09 NOTE — Telephone Encounter (Signed)
  1. Has your device fired? no  2. Is you device beeping? no  3. Are you experiencing draining or swelling at device site? no  4. Are you calling to see if we received your device transmission? no  5. Have you passed out? No  Patient called wanting to know if her Defib is working.    Please route to Device Clinic Pool

## 2023-04-11 ENCOUNTER — Encounter: Payer: 59 | Admitting: Podiatry

## 2023-04-11 ENCOUNTER — Telehealth: Payer: Self-pay | Admitting: Podiatry

## 2023-04-11 NOTE — Telephone Encounter (Signed)
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  AUTH Decision ID #: [id number].

## 2023-04-16 ENCOUNTER — Encounter (HOSPITAL_COMMUNITY): Payer: Self-pay

## 2023-04-16 ENCOUNTER — Encounter: Payer: Self-pay | Admitting: Cardiology

## 2023-04-16 NOTE — Progress Notes (Addendum)
 COVID Vaccine Completed:  Date of COVID positive in last 90 days:  No  PCP - Erla Sa, MD Cardiologist - Quintin Dawes, MD (notes under media) Electrophysiologist - Ole Holts, MD.   Chest x-ray - 11-03-22 Epic EKG - 12-14-22 Epic Stress Test - 05-02-16 [facility] ECHO - 11-05-22 Epic Cardiac Cath -  Pacemaker/ICD device last checked: 01-21-23. Device orders in chart Spinal Cord Stimulator:  Bowel Prep - N/A  Sleep Study - N/A CPAP -   Fasting Blood Sugar - 127 to 300 Checks Blood Sugar - 2 or 3 times a day  Ozempic Last dose of GLP1 agonist-  04-12-23 GLP1 instructions:  Hold until after surgery     Jardiance Last dose of SGLT-2 inhibitors-   SGLT-2 instructions:  Hold 3 days before surgery   Blood Thinner Instructions:  Coumadin .  Per patient she is to hold 3 days  Aspirin Instructions: Last Dose:  Activity level:  Can go up a flight of stairs and perform activities of daily living without stopping and without symptoms of chest pain or shortness of breath.  Patient states that she has stairs at home and has no issues going up and down them.  Anesthesia review: CHF,  Carotid stenosis, CKD, HTN, DM.  Hx of aortic valve replacement, ICD  A1c 8.0 on PAT labs  Awaiting H&P from PCP  Patient denies shortness of breath, fever, cough and chest pain at PAT appointment  Patient verbalized understanding of instructions that were given to them at the PAT appointment. Patient was also instructed that they will need to review over the PAT instructions again at home before surgery.

## 2023-04-16 NOTE — Patient Instructions (Addendum)
 SURGICAL WAITING ROOM VISITATION Patients having surgery or a procedure may have no more than 2 support people in the waiting area - these visitors may rotate.    Children under the age of 78 must have an adult with them who is not the patient.  Due to an increase in RSV and influenza rates and associated hospitalizations, children ages 35 and under may not visit patients in [facility] hospitals.   If the patient needs to stay at the hospital during part of their recovery, the visitor guidelines for inpatient rooms apply. Pre-op nurse will coordinate an appropriate time for 1 support person to accompany patient in pre-op.  This support person may not rotate.    Please refer to the [facility] website for the visitor guidelines for Inpatients (after your surgery is over and you are in a regular room).       Your procedure is scheduled on: 04-24-23   Report to [facility], [facility], [facility] Main Entrance    Report to admitting at 6:15 AM   Call this number if you have problems the morning of surgery [phone]   Do not eat food :After Midnight.   After Midnight you may have the following liquids until 5:30 AM DAY OF SURGERY  Water Non-Citrus Juices (without pulp, NO RED-Apple, White grape, White cranberry) Black Coffee (NO MILK/CREAM OR CREAMERS, sugar ok)  Clear Tea (NO MILK/CREAM OR CREAMERS, sugar ok) regular and decaf                             Plain Jell-O (NO RED)                                           Fruit ices (not with fruit pulp, NO RED)                                     Popsicles (NO RED)                                                               Sports drinks like Gatorade (NO RED)               If you have questions, please contact your surgeon's office.   FOLLOW ANY ADDITIONAL PRE OP INSTRUCTIONS YOU RECEIVED FROM YOUR SURGEON'S OFFICE!!!     Oral Hygiene is also important to reduce your risk of infection.                                    Remember - BRUSH  YOUR TEETH THE MORNING OF SURGERY WITH YOUR REGULAR TOOTHPASTE   Do NOT smoke after Midnight   Take these medicines the morning of surgery with A SIP OF WATER:   Amiodarone   Atorvastatin   Carvedilol   Gabapentin   Omeprazole  Sertraline   Okay to use inhalers  If needed Tylenol , Oxycodone , Alprazolam   Stop all vitamins and herbal supplements 7 days before surgery  How to Manage Your Diabetes Before and  After Surgery  Why is it important to control my blood sugar before and after surgery? Improving blood sugar levels before and after surgery helps healing and can limit problems. A way of improving blood sugar control is eating a healthy diet by:  Eating less sugar and carbohydrates  Increasing activity/exercise  Talking with your doctor about reaching your blood sugar goals High blood sugars (greater than 180 mg/dL) can raise your risk of infections and slow your recovery, so you will need to focus on controlling your diabetes during the weeks before surgery. Make sure that the doctor who takes care of your diabetes knows about your planned surgery including the date and location.  How do I manage my blood sugar before surgery? Check your blood sugar at least 4 times a day, starting 2 days before surgery, to make sure that the level is not too high or low. Check your blood sugar the morning of your surgery when you wake up and every 2 hours until you get to the Short Stay unit. If your blood sugar is less than 70 mg/dL, you will need to treat for low blood sugar: Do not take insulin . Treat a low blood sugar (less than 70 mg/dL) with  cup of clear juice (cranberry or apple), 4 glucose tablets, OR glucose gel. Recheck blood sugar in 15 minutes after treatment (to make sure it is greater than 70 mg/dL). If your blood sugar is not greater than 70 mg/dL on recheck, call [phone] for further instructions. Report your blood sugar to the short stay nurse when you get to Short Stay.  If  you are admitted to the hospital after surgery: Your blood sugar will be checked by the staff and you will probably be given insulin  after surgery (instead of oral diabetes medicines) to make sure you have good blood sugar levels. The goal for blood sugar control after surgery is 80-180 mg/dL.   WHAT DO I DO ABOUT MY DIABETES MEDICATION?  Do not take oral diabetes medicines (pills) the morning of surgery.  Hold Jardiance 3 days before surgery (do not take after 04-20-23).  Hold Ozempic 7 days before surgery (do not take after 04-16-23)      THE NIGHT BEFORE SURGERY TAKE 1/2 DOSE OF Levemir         The morning of surgery only do 1/2 dose of Novolog  if CBG 220 or higher  DO NOT TAKE THE FOLLOWING 7 DAYS PRIOR TO SURGERY: Ozempic, Wegovy, Rybelsus (Semaglutide), Byetta (exenatide), Bydureon (exenatide ER), Victoza, Saxenda (liraglutide), or Trulicity (dulaglutide) Mounjaro (Tirzepatide) Adlyxin (Lixisenatide), Polyethylene Glycol Loxenatide.  Reviewed and Endorsed by [facility] Patient Education Committee, August 2015                              You may not have any metal on your body including hair pins, jewelry, and body piercing             Do not wear make-up, lotions, powders, perfumes or deodorant  Do not wear nail polish including gel and S&S, artificial/acrylic nails, or any other type of covering on natural nails including finger and toenails. If you have artificial nails, gel coating, etc. that needs to be removed by a nail salon please have this removed prior to surgery or surgery may need to be canceled/ delayed if the surgeon/ anesthesia feels like they are unable to be safely monitored.   Do not shave  48 hours prior to  surgery.    Do not bring valuables to the hospital. [facility] IS NOT RESPONSIBLE   FOR VALUABLES.   Contacts, dentures or bridgework may not be worn into surgery.  DO NOT BRING YOUR HOME MEDICATIONS TO THE HOSPITAL. PHARMACY WILL DISPENSE MEDICATIONS LISTED  ON YOUR MEDICATION LIST TO YOU DURING YOUR ADMISSION IN THE HOSPITAL!    Patients discharged on the day of surgery will not be allowed to drive home.  Someone NEEDS to stay with you for the first 24 hours after anesthesia.              Please read over the following fact sheets you were given: IF YOU HAVE QUESTIONS ABOUT YOUR PRE-OP INSTRUCTIONS PLEASE CALL [phone] Gwen  If you received a COVID test during your pre-op visit  it is requested that you wear a mask when out in public, stay away from anyone that may not be feeling well and notify your surgeon if you develop symptoms. If you test positive for Covid or have been in contact with anyone that has tested positive in the last 10 days please notify you surgeon.  [facility] - Preparing for Surgery Before surgery, you can play an important role.  Because skin is not sterile, your skin needs to be as free of germs as possible.  You can reduce the number of germs on your skin by washing with CHG (chlorahexidine gluconate) soap before surgery.  CHG is an antiseptic cleaner which kills germs and bonds with the skin to continue killing germs even after washing. Please DO NOT use if you have an allergy to CHG or antibacterial soaps.  If your skin becomes reddened/irritated stop using the CHG and inform your nurse when you arrive at Short Stay. Do not shave (including legs and underarms) for at least 48 hours prior to the first CHG shower.  You may shave your face/neck.  Please follow these instructions carefully:  1.  Shower with CHG Soap the night before surgery and the  morning of surgery.  2.  If you choose to wash your hair, wash your hair first as usual with your normal  shampoo.  3.  After you shampoo, rinse your hair and body thoroughly to remove the shampoo.                             4.  Use CHG as you would any other liquid soap.  You can apply chg directly to the skin and wash.  Gently with a scrungie or clean washcloth.  5.  Apply  the CHG Soap to your body ONLY FROM THE NECK DOWN.   Do   not use on face/ open                           Wound or open sores. Avoid contact with eyes, ears mouth and   genitals (private parts).                       Wash face,  Genitals (private parts) with your normal soap.             6.  Wash thoroughly, paying special attention to the area where your    surgery  will be performed.  7.  Thoroughly rinse your body with warm water from the neck down.  8.  DO NOT shower/wash with your normal soap after  using and rinsing off the CHG Soap.                9.  Pat yourself dry with a clean towel.            10.  Wear clean pajamas.            11.  Place clean sheets on your bed the night of your first shower and do not  sleep with pets. Day of Surgery : Do not apply any lotions/deodorants the morning of surgery.  Please wear clean clothes to the hospital/surgery center.  FAILURE TO FOLLOW THESE INSTRUCTIONS MAY RESULT IN THE CANCELLATION OF YOUR SURGERY  PATIENT SIGNATURE_________________________________  NURSE SIGNATURE__________________________________  ________________________________________________________________________

## 2023-04-16 NOTE — Progress Notes (Signed)
 Surgery orders requested via Epic inbox.

## 2023-04-16 NOTE — Progress Notes (Signed)
 PERIOPERATIVE PRESCRIPTION FOR IMPLANTED CARDIAC DEVICE PROGRAMMING  Patient Information: Name:  Donna Hopkins  DOB:  1955-05-05  MRN:  [id number]    Planned Procedure:  Amputation third toe left foot  Surgeon:  Dr. Lamount  Date of Procedure:  04-24-23  Cautery will be used.  Position during surgery:  Supoine   Please send documentation back to:  Darryle Law [phone] # [phone])  Device Information:  Clinic EP Physician:  Dr. Ole Holts   Device Type:  Defibrillator Manufacturer and Phone #:  [employer]: [phone] Pacemaker Dependent?:  No. Date of Last Device Check:  04/09/2023 Normal Device Function?:  Yes.    Electrophysiologist's Recommendations:  Have magnet available. Provide continuous ECG monitoring when magnet is used or reprogramming is to be performed.  Procedure should not interfere with device function.  No device programming or magnet placement needed.  Per Device Clinic Standing Orders, Donna ONEIDA Shutter, RN  9:27 AM 04/16/2023

## 2023-04-18 ENCOUNTER — Encounter (HOSPITAL_COMMUNITY)
Admission: RE | Admit: 2023-04-18 | Discharge: 2023-04-18 | Disposition: A | Discharge status: Home / Self Care | Payer: 59 | Source: Ambulatory Visit | Attending: Podiatry | Admitting: Podiatry

## 2023-04-18 ENCOUNTER — Telehealth: Payer: Self-pay

## 2023-04-18 ENCOUNTER — Encounter (HOSPITAL_COMMUNITY): Payer: Self-pay

## 2023-04-18 ENCOUNTER — Other Ambulatory Visit: Payer: Self-pay

## 2023-04-18 VITALS — BP 152/79 | HR 80 | Temp 98.6°F | Resp 16 | Ht 69.5 in | Wt 209.4 lb

## 2023-04-18 DIAGNOSIS — N183 Chronic kidney disease, stage 3 unspecified: Secondary | ICD-10-CM | POA: Diagnosis not present

## 2023-04-18 DIAGNOSIS — I509 Heart failure, unspecified: Secondary | ICD-10-CM | POA: Diagnosis not present

## 2023-04-18 DIAGNOSIS — Z952 Presence of prosthetic heart valve: Secondary | ICD-10-CM | POA: Diagnosis not present

## 2023-04-18 DIAGNOSIS — Z01812 Encounter for preprocedural laboratory examination: Secondary | ICD-10-CM | POA: Diagnosis present

## 2023-04-18 DIAGNOSIS — M869 Osteomyelitis, unspecified: Secondary | ICD-10-CM | POA: Diagnosis not present

## 2023-04-18 DIAGNOSIS — Z794 Long term (current) use of insulin: Secondary | ICD-10-CM | POA: Diagnosis not present

## 2023-04-18 DIAGNOSIS — E1169 Type 2 diabetes mellitus with other specified complication: Secondary | ICD-10-CM | POA: Diagnosis not present

## 2023-04-18 DIAGNOSIS — I110 Hypertensive heart disease with heart failure: Secondary | ICD-10-CM | POA: Insufficient documentation

## 2023-04-18 DIAGNOSIS — I2510 Atherosclerotic heart disease of native coronary artery without angina pectoris: Secondary | ICD-10-CM | POA: Insufficient documentation

## 2023-04-18 DIAGNOSIS — Z95810 Presence of automatic (implantable) cardiac defibrillator: Secondary | ICD-10-CM | POA: Insufficient documentation

## 2023-04-18 DIAGNOSIS — D649 Anemia, unspecified: Secondary | ICD-10-CM | POA: Diagnosis not present

## 2023-04-18 DIAGNOSIS — Z7901 Long term (current) use of anticoagulants: Secondary | ICD-10-CM

## 2023-04-18 DIAGNOSIS — E119 Type 2 diabetes mellitus without complications: Secondary | ICD-10-CM

## 2023-04-18 DIAGNOSIS — I10 Essential (primary) hypertension: Secondary | ICD-10-CM

## 2023-04-18 DIAGNOSIS — E1122 Type 2 diabetes mellitus with diabetic chronic kidney disease: Secondary | ICD-10-CM | POA: Diagnosis not present

## 2023-04-18 HISTORY — DX: Heart failure, unspecified: I50.9

## 2023-04-18 HISTORY — DX: Anemia, unspecified: D64.9

## 2023-04-18 LAB — CBC
HCT: 36.3 % (ref 36.0–46.0)
Hemoglobin: 11.8 g/dL — ABNORMAL LOW (ref 12.0–15.0)
MCH: 31.2 pg (ref 26.0–34.0)
MCHC: 32.5 g/dL (ref 30.0–36.0)
MCV: 96 fL (ref 80.0–100.0)
Platelets: 208 10*3/uL (ref 150–400)
RBC: 3.78 MIL/uL — ABNORMAL LOW (ref 3.87–5.11)
RDW: 14.3 % (ref 11.5–15.5)
WBC: 4.5 10*3/uL (ref 4.0–10.5)
nRBC: 0 % (ref 0.0–0.2)

## 2023-04-18 LAB — BASIC METABOLIC PANEL
Anion gap: 11 (ref 5–15)
BUN: 16 mg/dL (ref 8–23)
CO2: 24 mmol/L (ref 22–32)
Calcium: 8.9 mg/dL (ref 8.9–10.3)
Chloride: 102 mmol/L (ref 98–111)
Creatinine, Ser: 0.76 mg/dL (ref 0.44–1.00)
GFR, Estimated: >60 mL/min (ref >60)
Glucose, Bld: 143 mg/dL — ABNORMAL HIGH (ref 70–99)
Potassium: 4 mmol/L (ref 3.5–5.1)
Sodium: 137 mmol/L (ref 135–145)

## 2023-04-18 LAB — HEMOGLOBIN A1C
Hgb A1c MFr Bld: 8 % — ABNORMAL HIGH (ref 4.8–5.6)
Mean Plasma Glucose: 182.9 mg/dL

## 2023-04-18 LAB — GLUCOSE, CAPILLARY: Glucose-Capillary: 129 mg/dL — ABNORMAL HIGH (ref 70–99)

## 2023-04-18 NOTE — Telephone Encounter (Signed)
 Gwen from [facility] pre-op called and left a message - asking for orders to be put in epic - patient is having surgery tomorrow. thanks

## 2023-04-19 NOTE — Progress Notes (Addendum)
 Anesthesia Chart Review   Case: [id number] Date/Time: 04/24/23 0815   Procedure: AMPUTATION TOE THIRD (Left: Toe)   Anesthesia type: Choice   Pre-op diagnosis: OSTEOMYELITIS   Location: [facility] ROOM 06 / [facility]   Surgeons: Lamount Ethan CROME, DPM       DISCUSSION:68 y.o. former smoker with h/o HTN, s/p mechanical aortic valve replacement on Coumadin  (by VTI measures  1.75 cm. Aortic valve mean gradient measures 19.7 mmHg on Echo 11/05/2022), CAD, CHF, ICD in place (device orders in 04/16/23 progress note), DM II, CKD Stage III, osteomyelitis scheduled for above procedure 04/24/23 with Ethan Lamount, DPM.   Last seen by EP 12/14/2022. Per OV note, pt euvolemic, stable on medical regimen.  3 month follow up recommended.   Cardiac clearance requested.    H&P requested.    Per cardiology preoperative evaluation 04/23/2023,  Chart reviewed as part of pre-operative protocol coverage and call was placed for patient assessment. Given past medical history and time since last visit, based on ACC/AHA guidelines, Donna Hopkins would be at acceptable risk for the planned procedure without further cardiovascular testing.  Pt reports she was advised by podiatry to hold Coumadin  for three days.  At the time of my chart review pt had followed these instructions. She reports she took her last dose 04/20/23 at 3pm as instructed by podiatrist. Unsure who is managing Coumadin  at this time, cardio reports they are not managing it, not followed with INR clinic there.  PCP notes state managed by cardio.   Spoke with patient, reports she follows with Dr. Licia for Coumadin  management.  Discussed with Dr. Rufino nurse, last INR checked in December, 2.5 at this time.   VS: BP (!) 152/79   Pulse 80   Temp 37 C (Oral)   Resp 16   Ht 5' 9.5 (1.765 m)   Wt 95 kg   SpO2 99%   BMI 30.49 kg/m   PROVIDERS: Charlette Erla LABOR, MD is PCP   Dr. Licia at [facility] manages Coumadin   Electrophysiologist:  OLE ONEIDA HOLTS, MD     LABS: Labs reviewed: Acceptable for surgery. (all labs ordered are listed, but only abnormal results are displayed)  Labs Reviewed  HEMOGLOBIN A1C - Abnormal; Notable for the following components:      Result Value   Hgb A1c MFr Bld 8.0 (*)    All other components within normal limits  BASIC METABOLIC PANEL - Abnormal; Notable for the following components:   Glucose, Bld 143 (*)    All other components within normal limits  CBC - Abnormal; Notable for the following components:   RBC 3.78 (*)    Hemoglobin 11.8 (*)    All other components within normal limits  GLUCOSE, CAPILLARY - Abnormal; Notable for the following components:   Glucose-Capillary 129 (*)    All other components within normal limits     IMAGES: Carotid Duplex 05/10/2016 Mild less than or equal to 39% left and right internal carotid artery stenosis.   EKG:   CV: Echo 11/05/2022 1. Left ventricular ejection fraction, by estimation, is 60 to 65%. The  left ventricle has normal function. The left ventricle has no regional  wall motion abnormalities. There is moderate concentric left ventricular  hypertrophy. Left ventricular  diastolic parameters are consistent with Grade I diastolic dysfunction  (impaired relaxation).   2. Right ventricular systolic function is normal. The right ventricular  size is normal. Tricuspid regurgitation signal is inadequate for assessing  PA pressure.  3. The mitral valve is normal in structure. No evidence of mitral valve  regurgitation. No evidence of mitral stenosis.   4. The aortic valve was not well visualized. Aortic valve regurgitation  is not visualized. No aortic stenosis is present. There is a mechanical  valve present in the aortic position. Aortic valve area, by VTI measures  1.75 cm. Aortic valve mean gradient   measures 19.7 mmHg. Aortic valve Vmax measures 3.07 m/s. DVI stable at  0.36. Normal functioning prosthesis. No significant change  in mean AVG,  DVI or Vmax.   5. The inferior vena cava is normal in size with greater than 50%  respiratory variability, suggesting right atrial pressure of 3 mmHg.   6. Compared to study dated 06/06/2021, there does not appear to be any  significant perivalvular leak of the mechanical AVR.  Past Medical History:  Diagnosis Date   Anemia    Anxiety    Arthritis    Carotid stenosis, asymptomatic, bilateral    CHF (congestive heart failure) (HCC)    Chronic kidney disease, stage III (moderate) (HCC)    Chronic low back pain    Clinical systolic heart failure, chronic (HCC)    Depression    Diabetes mellitus    Eosinophilic gastritis    GERD (gastroesophageal reflux disease)    Gout, renal disease    H/O mechanical aortic valve replacement    on coumadin    Hx of aortic valve replacement    Hypertension    Hypocalcemia    Hyponatremia    ICD (implantable cardioverter-defibrillator) in place    Medication refill    Mixed hyperlipidemia    Neuropathy    Obesity    OP (osteoporosis)    Peripheral vascular disease, unspecified (HCC)    Persistent insomnia    Personal history of colonic polyps    Pulmonary hypertension (HCC)    Snoring    declines sleep study   Tricuspid regurgitation    Uncontrolled type 2 diabetes mellitus with complication    Valgus foot    Vertigo, benign positional    Vitamin D deficiency     Past Surgical History:  Procedure Laterality Date   ABDOMINAL HYSTERECTOMY     BACK SURGERY     BI-VENTRICULAR IMPLANTABLE CARDIOVERTER DEFIBRILLATOR  (CRT-D)  07/08/2012   SJM Quadra Assura BiV ICD implanted by Dr Tylson at [facility]   CARDIAC VALVE REPLACEMENT     LEAD INSERTION N/A 04/16/2018   Successful BiV ICD system revision with a new RV lead and pulse generator replacement with a [employer] Quadra Assura MP model [phone] ICD    MEDICATIONS:  insulin  aspart (NOVOLOG ) 100 UNIT/ML injection   acetaminophen  (TYLENOL ) 650 MG CR tablet   albuterol   (PROVENTIL  HFA;VENTOLIN  HFA) 108 (90 BASE) MCG/ACT inhaler   ALPRAZolam  (XANAX ) 0.5 MG tablet   amiodarone  (PACERONE ) 200 MG tablet   atorvastatin  (LIPITOR) 10 MG tablet   carvedilol  (COREG ) 25 MG tablet   cyclobenzaprine  (FLEXERIL ) 10 MG tablet   doxycycline  (VIBRA -TABS) 100 MG tablet   ferrous sulfate  325 (65 FE) MG tablet   furosemide  (LASIX ) 40 MG tablet   gabapentin  (NEURONTIN ) 300 MG capsule   JARDIANCE 25 MG TABS tablet   LEVEMIR  FLEXTOUCH 100 UNIT/ML Pen   mupirocin  ointment (BACTROBAN ) 2 %   omeprazole (PRILOSEC) 40 MG capsule   oxyCODONE -acetaminophen  (PERCOCET) 10-325 MG per tablet   potassium chloride  SA (K-DUR,KLOR-CON ) 20 MEQ tablet   ramipril  (ALTACE ) 10 MG capsule   Semaglutide, 2  MG/DOSE, (OZEMPIC, 2 MG/DOSE,) 8 MG/3ML SOPN   sertraline  (ZOLOFT ) 100 MG tablet   triamcinolone cream (KENALOG) 0.1 %   warfarin (COUMADIN ) 5 MG tablet   No current facility-administered medications for this encounter.   Harlene Hoots Ward, PA-C [facility]-Surgical Testing [phone]

## 2023-04-22 ENCOUNTER — Ambulatory Visit (INDEPENDENT_AMBULATORY_CARE_PROVIDER_SITE_OTHER): Payer: Medicare Other

## 2023-04-22 ENCOUNTER — Telehealth: Payer: Self-pay | Admitting: Cardiology

## 2023-04-22 ENCOUNTER — Ambulatory Visit: Payer: 59 | Admitting: Student

## 2023-04-22 DIAGNOSIS — I5022 Chronic systolic (congestive) heart failure: Secondary | ICD-10-CM

## 2023-04-22 DIAGNOSIS — I428 Other cardiomyopathies: Secondary | ICD-10-CM

## 2023-04-22 NOTE — Telephone Encounter (Signed)
 Per preop APP Slater Duncan, NP to call surgeon office in regard to if surgery is URGENT as well if Warfarin needs to be held.   I left message to call back to our preop team to discuss further.

## 2023-04-22 NOTE — Telephone Encounter (Signed)
   Pre-operative Risk Assessment    Patient Name: Donna Hopkins  DOB: Jan 21, 1956 MRN: [id number]   Date of last office visit: 01/03/23 w/ Percell Boyers, PA  Date of next office visit: 05/17/23 w/ Percell Boyers, PA    Request for Surgical Clearance    Procedure:   Amputation third left toe   Date of Surgery:  Clearance 04/24/23                                Surgeon:  Dr. Marvis Sluder  Surgeon's Group or Practice Name:  [facility]  Phone number:  [phone] Fax number:  [phone]    Type of Clearance Requested:   - Medical  - Pharmacy:  Hold did not specify but asked for recommendations      Type of Anesthesia:   Choice    Additional requests/questions:    Signed, Chloe R McNeil   04/22/2023, 11:15 AM

## 2023-04-22 NOTE — Telephone Encounter (Signed)
 Patient's warfarin is managed by PCP.  I called her to conduct telephone assessment but she is out shopping. I plan to call her back on 04/23/23.   Gerldine Koch, NP-C  04/22/2023, 4:21 PM [address] Office [phone] Fax [phone]

## 2023-04-22 NOTE — Progress Notes (Signed)
 Left message with Nonnie Beagle at Dr. Candida Chalk office requesting H&P for surgery scheduled for 04-24-23.

## 2023-04-22 NOTE — Telephone Encounter (Signed)
 LATE ENTRY: Dr. Davonna Estes surgery scheduler Odilia Bennett called earlier today. She did apologize for the late request. She states the pt has been scheduled since 03/2023 but the hospital just told them today they need cardia clearance. Per Odilia Bennett, Dr. Davonna Estes needs the Warfarin to be held as well.

## 2023-04-23 ENCOUNTER — Ambulatory Visit: Payer: 59 | Attending: Cardiology

## 2023-04-23 DIAGNOSIS — Z95810 Presence of automatic (implantable) cardiac defibrillator: Secondary | ICD-10-CM

## 2023-04-23 DIAGNOSIS — I5022 Chronic systolic (congestive) heart failure: Secondary | ICD-10-CM

## 2023-04-23 LAB — CUP PACEART REMOTE DEVICE CHECK
Battery Remaining Longevity: 20 mo
Battery Remaining Percentage: 28 %
Battery Voltage: 2.89 V
Brady Statistic AP VP Percent: 1 %
Brady Statistic AP VS Percent: 1 %
Brady Statistic AS VP Percent: 99 %
Brady Statistic AS VS Percent: 1 %
Brady Statistic RA Percent Paced: 1 %
Date Time Interrogation Session: 20250210020040
HighPow Impedance: 73 Ohm
HighPow Impedance: 73 Ohm
Implantable Lead Connection Status: 753985
Implantable Lead Connection Status: 753985
Implantable Lead Connection Status: 753985
Implantable Lead Implant Date: 20140429
Implantable Lead Implant Date: 20140617
Implantable Lead Implant Date: 20200205
Implantable Lead Location: 753858
Implantable Lead Location: 753859
Implantable Lead Location: 753860
Implantable Pulse Generator Implant Date: 20200205
Lead Channel Impedance Value: 410 Ohm
Lead Channel Impedance Value: 460 Ohm
Lead Channel Impedance Value: 710 Ohm
Lead Channel Pacing Threshold Amplitude: 0.75 V
Lead Channel Pacing Threshold Amplitude: 0.875 V
Lead Channel Pacing Threshold Amplitude: 1.75 V
Lead Channel Pacing Threshold Pulse Width: 0.5 ms
Lead Channel Pacing Threshold Pulse Width: 0.5 ms
Lead Channel Pacing Threshold Pulse Width: 0.7 ms
Lead Channel Sensing Intrinsic Amplitude: 12 mV
Lead Channel Sensing Intrinsic Amplitude: 3.8 mV
Lead Channel Setting Pacing Amplitude: 2 V
Lead Channel Setting Pacing Amplitude: 2.5 V
Lead Channel Setting Pacing Amplitude: 2.75 V
Lead Channel Setting Pacing Pulse Width: 0.5 ms
Lead Channel Setting Pacing Pulse Width: 0.7 ms
Lead Channel Setting Sensing Sensitivity: 0.5 mV
Pulse Gen Serial Number: 9879509

## 2023-04-23 NOTE — Telephone Encounter (Signed)
   Primary Cardiologist: None  Chart reviewed as part of pre-operative protocol coverage and call was placed for patient assessment. Given past medical history and time since last visit, based on ACC/AHA guidelines, Donna Hopkins would be at acceptable risk for the planned procedure without further cardiovascular testing.   Patient was advised that if she develops new symptoms prior to surgery to contact our office to arrange a follow-up appointment. She verbalized understanding.  I will route this recommendation to the requesting party via Epic fax function and remove from pre-op pool.  Please call with questions.  Levi Aland, NP-C 04/23/2023, 11:10 AM [address] Office [phone] Fax [phone]

## 2023-04-23 NOTE — Progress Notes (Signed)
H&P received for surgery 04-24-23.

## 2023-04-23 NOTE — Anesthesia Preprocedure Evaluation (Addendum)
Anesthesia Evaluation  Patient identified by MRN, date of birth, ID band Patient awake    Reviewed: Allergy & Precautions, NPO status , Patient's Chart, lab work & pertinent test results, reviewed documented beta blocker date and time   Airway Mallampati: III  TM Distance: >3 FB Neck ROM: Full    Dental  (+) Edentulous Upper   Pulmonary former smoker   Pulmonary exam normal breath sounds clear to auscultation       Cardiovascular hypertension, Pt. on medications and Pt. on home beta blockers + Peripheral Vascular Disease  Normal cardiovascular exam+ Cardiac Defibrillator (BiV AICD 2014) + Valvular Problems/Murmurs (s/p AVR, coumadin LD 2/8)  Rhythm:Regular Rate:Normal  Echo 2024  1. Left ventricular ejection fraction, by estimation, is 60 to 65%. The  left ventricle has normal function. The left ventricle has no regional  wall motion abnormalities. There is moderate concentric left ventricular  hypertrophy. Left ventricular  diastolic parameters are consistent with Grade I diastolic dysfunction  (impaired relaxation).   2. Right ventricular systolic function is normal. The right ventricular  size is normal. Tricuspid regurgitation signal is inadequate for assessing  PA pressure.   3. The mitral valve is normal in structure. No evidence of mitral valve  regurgitation. No evidence of mitral stenosis.   4. The aortic valve was not well visualized. Aortic valve regurgitation  is not visualized. No aortic stenosis is present. There is a mechanical  valve present in the aortic position. Aortic valve area, by VTI measures  1.75 cm. Aortic valve mean gradient   measures 19.7 mmHg. Aortic valve Vmax measures 3.07 m/s. DVI stable at  0.36. Normal functioning prosthesis. No significant change in mean AVG,  DVI or Vmax.   5. The inferior vena cava is normal in size with greater than 50%  respiratory variability, suggesting right atrial  pressure of 3 mmHg.   6. Compared to study dated 06/06/2021, there does not appear to be any  significant perivalvular leak of the mechanical AVR.     Neuro/Psych  PSYCHIATRIC DISORDERS Anxiety Depression    negative neurological ROS     GI/Hepatic Neg liver ROS,GERD  Controlled,,  Endo/Other  diabetes, Well Controlled, Type 2, Insulin Dependent  FS 140 this AM  Renal/GU negative Renal ROS  negative genitourinary   Musculoskeletal  (+) Arthritis , Osteoarthritis,    Abdominal   Peds  Hematology  (+) Blood dyscrasia, anemia Hb 11.8, plt 208   Anesthesia Other Findings Ozempic LD: 1wk  Reproductive/Obstetrics negative OB ROS                             Anesthesia Physical Anesthesia Plan  ASA: 3  Anesthesia Plan: MAC   Post-op Pain Management: Tylenol PO (pre-op)*   Induction:   PONV Risk Score and Plan: 2 and Propofol infusion and TIVA  Airway Management Planned: Natural Airway and Simple Face Mask  Additional Equipment: None  Intra-op Plan:   Post-operative Plan:   Informed Consent: I have reviewed the patients History and Physical, chart, labs and discussed the procedure including the risks, benefits and alternatives for the proposed anesthesia with the patient or authorized representative who has indicated his/her understanding and acceptance.       Plan Discussed with: CRNA  Anesthesia Plan Comments:        Anesthesia Quick Evaluation

## 2023-04-24 ENCOUNTER — Ambulatory Visit (HOSPITAL_COMMUNITY)
Admission: RE | Admit: 2023-04-24 | Discharge: 2023-04-24 | Disposition: A | Discharge status: Home / Self Care | Payer: 59 | Attending: Podiatry | Admitting: Podiatry

## 2023-04-24 ENCOUNTER — Encounter (HOSPITAL_COMMUNITY): Payer: Self-pay

## 2023-04-24 ENCOUNTER — Encounter (HOSPITAL_COMMUNITY)
Admission: RE | Disposition: A | Discharge status: Home / Self Care | Payer: Self-pay | Source: Home / Self Care | Attending: Podiatry

## 2023-04-24 ENCOUNTER — Other Ambulatory Visit: Payer: Self-pay | Admitting: Podiatry

## 2023-04-24 ENCOUNTER — Other Ambulatory Visit: Payer: Self-pay

## 2023-04-24 ENCOUNTER — Ambulatory Visit (HOSPITAL_COMMUNITY): Payer: 59

## 2023-04-24 ENCOUNTER — Ambulatory Visit (HOSPITAL_COMMUNITY): Payer: 59 | Admitting: Physician Assistant

## 2023-04-24 ENCOUNTER — Ambulatory Visit (HOSPITAL_BASED_OUTPATIENT_CLINIC_OR_DEPARTMENT_OTHER): Payer: 59 | Admitting: Anesthesiology

## 2023-04-24 DIAGNOSIS — Z7984 Long term (current) use of oral hypoglycemic drugs: Secondary | ICD-10-CM | POA: Diagnosis not present

## 2023-04-24 DIAGNOSIS — I509 Heart failure, unspecified: Secondary | ICD-10-CM | POA: Insufficient documentation

## 2023-04-24 DIAGNOSIS — Z794 Long term (current) use of insulin: Secondary | ICD-10-CM | POA: Diagnosis not present

## 2023-04-24 DIAGNOSIS — Z79899 Other long term (current) drug therapy: Secondary | ICD-10-CM | POA: Diagnosis not present

## 2023-04-24 DIAGNOSIS — Z952 Presence of prosthetic heart valve: Secondary | ICD-10-CM | POA: Insufficient documentation

## 2023-04-24 DIAGNOSIS — Z833 Family history of diabetes mellitus: Secondary | ICD-10-CM | POA: Insufficient documentation

## 2023-04-24 DIAGNOSIS — I10 Essential (primary) hypertension: Secondary | ICD-10-CM | POA: Diagnosis not present

## 2023-04-24 DIAGNOSIS — I130 Hypertensive heart and chronic kidney disease with heart failure and stage 1 through stage 4 chronic kidney disease, or unspecified chronic kidney disease: Secondary | ICD-10-CM | POA: Diagnosis not present

## 2023-04-24 DIAGNOSIS — E119 Type 2 diabetes mellitus without complications: Secondary | ICD-10-CM

## 2023-04-24 DIAGNOSIS — Z87891 Personal history of nicotine dependence: Secondary | ICD-10-CM | POA: Diagnosis not present

## 2023-04-24 DIAGNOSIS — E11621 Type 2 diabetes mellitus with foot ulcer: Secondary | ICD-10-CM | POA: Insufficient documentation

## 2023-04-24 DIAGNOSIS — F419 Anxiety disorder, unspecified: Secondary | ICD-10-CM | POA: Insufficient documentation

## 2023-04-24 DIAGNOSIS — F32A Depression, unspecified: Secondary | ICD-10-CM | POA: Insufficient documentation

## 2023-04-24 DIAGNOSIS — E1122 Type 2 diabetes mellitus with diabetic chronic kidney disease: Secondary | ICD-10-CM | POA: Insufficient documentation

## 2023-04-24 DIAGNOSIS — M86172 Other acute osteomyelitis, left ankle and foot: Secondary | ICD-10-CM | POA: Diagnosis not present

## 2023-04-24 DIAGNOSIS — Z8249 Family history of ischemic heart disease and other diseases of the circulatory system: Secondary | ICD-10-CM | POA: Insufficient documentation

## 2023-04-24 DIAGNOSIS — L97529 Non-pressure chronic ulcer of other part of left foot with unspecified severity: Secondary | ICD-10-CM | POA: Diagnosis not present

## 2023-04-24 DIAGNOSIS — Z7985 Long-term (current) use of injectable non-insulin antidiabetic drugs: Secondary | ICD-10-CM | POA: Diagnosis not present

## 2023-04-24 DIAGNOSIS — N183 Chronic kidney disease, stage 3 unspecified: Secondary | ICD-10-CM | POA: Insufficient documentation

## 2023-04-24 DIAGNOSIS — M1990 Unspecified osteoarthritis, unspecified site: Secondary | ICD-10-CM | POA: Insufficient documentation

## 2023-04-24 DIAGNOSIS — D631 Anemia in chronic kidney disease: Secondary | ICD-10-CM | POA: Insufficient documentation

## 2023-04-24 DIAGNOSIS — Z95810 Presence of automatic (implantable) cardiac defibrillator: Secondary | ICD-10-CM | POA: Diagnosis not present

## 2023-04-24 DIAGNOSIS — E1151 Type 2 diabetes mellitus with diabetic peripheral angiopathy without gangrene: Secondary | ICD-10-CM | POA: Diagnosis not present

## 2023-04-24 DIAGNOSIS — M869 Osteomyelitis, unspecified: Secondary | ICD-10-CM

## 2023-04-24 DIAGNOSIS — K219 Gastro-esophageal reflux disease without esophagitis: Secondary | ICD-10-CM | POA: Insufficient documentation

## 2023-04-24 DIAGNOSIS — Z7901 Long term (current) use of anticoagulants: Secondary | ICD-10-CM

## 2023-04-24 DIAGNOSIS — E1169 Type 2 diabetes mellitus with other specified complication: Secondary | ICD-10-CM | POA: Diagnosis present

## 2023-04-24 HISTORY — PX: AMPUTATION TOE: SHX6595

## 2023-04-24 LAB — GLUCOSE, CAPILLARY
Glucose-Capillary: 140 mg/dL — ABNORMAL HIGH (ref 70–99)
Glucose-Capillary: 131 mg/dL — ABNORMAL HIGH (ref 70–99)

## 2023-04-24 LAB — PROTIME-INR
INR: 2 — ABNORMAL HIGH (ref 0.8–1.2)
Prothrombin Time: 22.4 s — ABNORMAL HIGH (ref 11.4–15.2)

## 2023-04-24 LAB — APTT: aPTT: 37 s — ABNORMAL HIGH (ref 24–36)

## 2023-04-24 SURGERY — AMPUTATION, TOE
Anesthesia: Monitor Anesthesia Care | Site: Toe | Laterality: Left

## 2023-04-24 MED ORDER — BUPIVACAINE HCL (PF) 0.5 % IJ SOLN
INTRAMUSCULAR | Status: AC
  Filled 2023-04-24: qty 30

## 2023-04-24 MED ORDER — FLUCONAZOLE 150 MG PO TABS: 150.0000 mg | ORAL_TABLET | Freq: Once | ORAL | 0 refills | Status: AC

## 2023-04-24 MED ORDER — PROPOFOL 500 MG/50ML IV EMUL
INTRAVENOUS | Status: DC | PRN
  Administered 2023-04-24: 75 ug/kg/min via INTRAVENOUS

## 2023-04-24 MED ORDER — ONDANSETRON HCL 4 MG/2ML IJ SOLN: 4.0000 mg | Freq: Once | INTRAMUSCULAR | Status: DC | PRN

## 2023-04-24 MED ORDER — PROPOFOL 10 MG/ML IV BOLUS
INTRAVENOUS | Status: DC | PRN
  Administered 2023-04-24: 30 mg via INTRAVENOUS
  Administered 2023-04-24: 50 mg via INTRAVENOUS

## 2023-04-24 MED ORDER — DOXYCYCLINE HYCLATE 100 MG PO TABS
100.0000 mg | ORAL_TABLET | Freq: Two times a day (BID) | ORAL | 0 refills | Status: AC
Start: 2023-04-24 — End: 2023-05-04

## 2023-04-24 MED ORDER — DOXYCYCLINE HYCLATE 100 MG PO TABS
100.0000 mg | ORAL_TABLET | Freq: Two times a day (BID) | ORAL | Status: DC
  Filled 2023-04-24: qty 1

## 2023-04-24 MED ORDER — OXYCODONE HCL 5 MG PO TABS: 5.0000 mg | ORAL_TABLET | Freq: Once | ORAL | Status: DC | PRN

## 2023-04-24 MED ORDER — FENTANYL CITRATE PF 50 MCG/ML IJ SOSY: 25.0000 ug | PREFILLED_SYRINGE | INTRAMUSCULAR | Status: DC | PRN

## 2023-04-24 MED ORDER — ORAL CARE MOUTH RINSE: 15.0000 mL | Freq: Once | OROMUCOSAL | Status: AC

## 2023-04-24 MED ORDER — BUPIVACAINE HCL 0.5 % IJ SOLN
INTRAMUSCULAR | Status: DC | PRN
  Administered 2023-04-24: 10 mL

## 2023-04-24 MED ORDER — ACETAMINOPHEN 500 MG PO TABS
1000.0000 mg | ORAL_TABLET | Freq: Once | ORAL | Status: AC
  Administered 2023-04-24: 1000 mg via ORAL
  Filled 2023-04-24: qty 2

## 2023-04-24 MED ORDER — FENTANYL CITRATE (PF) 100 MCG/2ML IJ SOLN
INTRAMUSCULAR | Status: AC
  Filled 2023-04-24: qty 2

## 2023-04-24 MED ORDER — LIDOCAINE HCL 1 % IJ SOLN
INTRAMUSCULAR | Status: DC | PRN
  Administered 2023-04-24: 10 mL

## 2023-04-24 MED ORDER — FENTANYL CITRATE (PF) 100 MCG/2ML IJ SOLN
INTRAMUSCULAR | Status: DC | PRN
  Administered 2023-04-24: 50 ug via INTRAVENOUS

## 2023-04-24 MED ORDER — AMISULPRIDE (ANTIEMETIC) 5 MG/2ML IV SOLN: 10.0000 mg | Freq: Once | INTRAVENOUS | Status: DC | PRN

## 2023-04-24 MED ORDER — INSULIN ASPART 100 UNIT/ML IJ SOLN
0.0000 [IU] | INTRAMUSCULAR | Status: DC | PRN
  Administered 2023-04-24: 2 [IU] via SUBCUTANEOUS

## 2023-04-24 MED ORDER — PROPOFOL 1000 MG/100ML IV EMUL
INTRAVENOUS | Status: AC
  Filled 2023-04-24: qty 100

## 2023-04-24 MED ORDER — OXYCODONE HCL 5 MG/5ML PO SOLN: 5.0000 mg | Freq: Once | ORAL | Status: DC | PRN

## 2023-04-24 MED ORDER — 0.9 % SODIUM CHLORIDE (POUR BTL) OPTIME
TOPICAL | Status: DC | PRN
  Administered 2023-04-24: 1000 mL

## 2023-04-24 MED ORDER — PROPOFOL 10 MG/ML IV BOLUS
INTRAVENOUS | Status: AC
  Filled 2023-04-24: qty 20

## 2023-04-24 MED ORDER — FLUCONAZOLE 150 MG PO TABS: 150.0000 mg | ORAL_TABLET | Freq: Once | ORAL | Status: DC

## 2023-04-24 MED ORDER — CEFAZOLIN SODIUM-DEXTROSE 2-4 GM/100ML-% IV SOLN
2.0000 g | Freq: Once | INTRAVENOUS | Status: AC
  Administered 2023-04-24: 2 g via INTRAVENOUS
  Filled 2023-04-24: qty 100

## 2023-04-24 MED ORDER — INSULIN ASPART 100 UNIT/ML IJ SOLN
INTRAMUSCULAR | Status: AC
  Filled 2023-04-24: qty 1

## 2023-04-24 MED ORDER — LIDOCAINE HCL (PF) 1 % IJ SOLN
INTRAMUSCULAR | Status: AC
  Filled 2023-04-24: qty 30

## 2023-04-24 MED ORDER — CHLORHEXIDINE GLUCONATE 0.12 % MT SOLN
15.0000 mL | Freq: Once | OROMUCOSAL | Status: AC
  Administered 2023-04-24: 15 mL via OROMUCOSAL

## 2023-04-24 MED ORDER — LACTATED RINGERS IV SOLN: INTRAVENOUS | Status: DC | PRN

## 2023-04-24 SURGICAL SUPPLY — 30 items
BAG COUNTER SPONGE SURGICOUNT (BAG) IMPLANT
BLADE MINI RND TIP GREEN BEAV (BLADE) ×1 IMPLANT
BLADE SW THK.38XMED LNG THN (BLADE) ×1 IMPLANT
BNDG ELASTIC 2 VLCR STRL LF (GAUZE/BANDAGES/DRESSINGS) IMPLANT
BNDG ELASTIC 4INX 5YD STR LF (GAUZE/BANDAGES/DRESSINGS) ×1 IMPLANT
BNDG GAUZE DERMACEA FLUFF 4 (GAUZE/BANDAGES/DRESSINGS) ×1 IMPLANT
BNDG STRETCH 4X75 STRL LF (GAUZE/BANDAGES/DRESSINGS) IMPLANT
CNTNR URN SCR LID CUP LEK RST (MISCELLANEOUS) IMPLANT
DRSG ADAPTIC 3X8 NADH LF (GAUZE/BANDAGES/DRESSINGS) ×1 IMPLANT
ELECT REM PT RETURN 15FT ADLT (MISCELLANEOUS) ×1 IMPLANT
GAUZE SPONGE 4X4 12PLY STRL (GAUZE/BANDAGES/DRESSINGS) ×1 IMPLANT
GLOVE BIO SURGEON STRL SZ7.5 (GLOVE) ×1 IMPLANT
GLOVE BIOGEL PI IND STRL 7.5 (GLOVE) ×1 IMPLANT
GLOVE SURG SS PI 7.5 STRL IVOR (GLOVE) ×1 IMPLANT
GOWN STRL REUS W/ TWL LRG LVL3 (GOWN DISPOSABLE) ×1 IMPLANT
GOWN STRL REUS W/ TWL XL LVL3 (GOWN DISPOSABLE) ×1 IMPLANT
KIT BASIN OR (CUSTOM PROCEDURE TRAY) ×1 IMPLANT
KIT TURNOVER KIT A (KITS) IMPLANT
NDL HYPO 25X1 1.5 SAFETY (NEEDLE) ×1 IMPLANT
NDL SAFETY ECLIPSE 18X1.5 (NEEDLE) IMPLANT
NEEDLE HYPO 25X1 1.5 SAFETY (NEEDLE) ×1 IMPLANT
NS IRRIG 1000ML POUR BTL (IV SOLUTION) IMPLANT
PACK ORTHO EXTREMITY (CUSTOM PROCEDURE TRAY) IMPLANT
PENCIL SMOKE EVACUATOR (MISCELLANEOUS) IMPLANT
SET IRRIG Y TYPE TUR BLADDER L (SET/KITS/TRAYS/PACK) IMPLANT
SOL SCRUB PVP POV-IOD 4OZ 7.5% (MISCELLANEOUS) ×2
SOLUTION SCRB POV-IOD 4OZ 7.5% (MISCELLANEOUS) ×2 IMPLANT
SUT MNCRL AB 3-0 PS2 18 (SUTURE) ×1 IMPLANT
SUT PROLENE 2 0 SH DA (SUTURE) IMPLANT
UNDERPAD 30X36 HEAVY ABSORB (UNDERPADS AND DIAPERS) ×1 IMPLANT

## 2023-04-24 NOTE — Anesthesia Procedure Notes (Signed)
Procedure Name: MAC Date/Time: 04/24/2023 8:26 AM  Performed by: Nelle Don, CRNAPre-anesthesia Checklist: Patient identified, Emergency Drugs available, Suction available and Patient being monitored Oxygen Delivery Method: Simple face mask

## 2023-04-24 NOTE — Transfer of Care (Signed)
Immediate Anesthesia Transfer of Care Note  Patient: Donna Hopkins  Procedure(s) Performed: AMPUTATION TOE THIRD (Left: Toe)  Patient Location: PACU  Anesthesia Type:MAC  Level of Consciousness: awake, alert , and oriented  Airway & Oxygen Therapy: Patient Spontanous Breathing and Patient connected to face mask oxygen  Post-op Assessment: Report given to RN, Post -op Vital signs reviewed and stable, and Patient moving all extremities X 4  Post vital signs: Reviewed and stable  Last Vitals:  Vitals Value Taken Time  BP 133/62 04/24/23 0908  Temp    Pulse 80 04/24/23 0910  Resp 17 04/24/23 0910  SpO2 100 % 04/24/23 0910  Vitals shown include unfiled device data.  Last Pain:  Vitals:   04/24/23 0652  TempSrc: Oral  PainSc: 0-No pain         Complications: No notable events documented.

## 2023-04-24 NOTE — Anesthesia Postprocedure Evaluation (Signed)
Anesthesia Post Note  Patient: Donna Hopkins  Procedure(s) Performed: AMPUTATION TOE THIRD (Left: Toe)     Patient location during evaluation: PACU Anesthesia Type: MAC Level of consciousness: awake and alert Pain management: pain level controlled Vital Signs Assessment: post-procedure vital signs reviewed and stable Respiratory status: spontaneous breathing, nonlabored ventilation and respiratory function stable Cardiovascular status: blood pressure returned to baseline and stable Postop Assessment: no apparent nausea or vomiting Anesthetic complications: no   No notable events documented.  Last Vitals:  Vitals:   04/24/23 0945 04/24/23 1000  BP: (!) 141/68 (!) 150/69  Pulse: 72 71  Resp: 12 12  Temp:  36.6 C  SpO2: 99% 98%    Last Pain:  Vitals:   04/24/23 1000  TempSrc:   PainSc: 0-No pain                 Lannie Fields

## 2023-04-24 NOTE — H&P (Signed)
SURGICAL HISTORY and PHYSICAL FOOT & ANKLE SURGERY    Date Time: 04/24/2023, 7:57 AM Physician: Dr. Bronwen Betters   History of Presenting Illness: Donna Hopkins is a 68 y.o. year old female presenting to [facility] for surgery on her left foot third toe today. The patient complains of ulceration that has been recurrent off and on, that has been present for about 1 month, but that has progressively worsened during this time and has progressed to osteomyelitis of the distal phalanx of the third toe. Conservative management was attempted including local wound care with debridement and antibiotics, but was ultimately unsuccessful. The patient has elected to pursue surgical management and understands the procedure, benefits, risks/complications, and alternatives to surgery.  Patient has remained npo since midnight of last night. Otherwise feeling well today, no other complaints. Denies fever, chills, nausea, emesis, dyspnea, and chest pain.  Past Medical History: Past Medical History:  Diagnosis Date   Anemia    Anxiety    Arthritis    Carotid stenosis, asymptomatic, bilateral    CHF (congestive heart failure) (HCC)    Chronic kidney disease, stage III (moderate) (HCC)    Chronic low back pain    Clinical systolic heart failure, chronic (HCC)    Depression    Diabetes mellitus    Eosinophilic gastritis    GERD (gastroesophageal reflux disease)    Gout, renal disease    H/O mechanical aortic valve replacement    on coumadin   Hx of aortic valve replacement    Hypertension    Hypocalcemia    Hyponatremia    ICD (implantable cardioverter-defibrillator) in place    Medication refill    Mixed hyperlipidemia    Neuropathy    Obesity    OP (osteoporosis)    Peripheral vascular disease, unspecified (HCC)    Persistent insomnia    Personal history of colonic polyps    Pulmonary hypertension (HCC)    Snoring    declines sleep study   Tricuspid regurgitation    Uncontrolled type 2  diabetes mellitus with complication    Valgus foot    Vertigo, benign positional    Vitamin D deficiency     Past Surgical History: Past Surgical History:  Procedure Laterality Date   ABDOMINAL HYSTERECTOMY     BACK SURGERY     BI-VENTRICULAR IMPLANTABLE CARDIOVERTER DEFIBRILLATOR  (CRT-D)  07/08/2012   SJM [employer] BiV ICD implanted by Dr Reed Pandy at [facility]   CARDIAC VALVE REPLACEMENT     LEAD INSERTION N/A 04/16/2018   Successful BiV ICD system revision with a new RV lead and pulse generator replacement with a [facility] [employer] [place] model [id number] ICD    Allergies: Allergies  Allergen Reactions   Canagliflozin Shortness Of Breath   Metformin Diarrhea    Other Reaction(s): Diarrhea   Tetanus Toxoids     Infection at injection site    Home Medications: Current Facility-Administered Medications  Medication Dose Route Frequency Provider Last Rate Last Admin   insulin aspart (novoLOG) 100 UNIT/ML injection            insulin aspart (novoLOG) injection 0-14 Units  0-14 Units Subcutaneous Q2H PRN Lannie Fields, DO   2 Units at 04/24/23 0707   Medications Prior to Admission  Medication Sig Dispense Refill Last Dose/Taking   acetaminophen (TYLENOL) 650 MG CR tablet Take 1,300 mg by mouth daily as needed for pain.   Past Week   albuterol (PROVENTIL HFA;VENTOLIN HFA) 108 (90  BASE) MCG/ACT inhaler Inhale 2 puffs into the lungs every 6 (six) hours as needed for wheezing or shortness of breath.    Taking As Needed   ALPRAZolam (XANAX) 0.5 MG tablet Take 0.5 mg by mouth 2 (two) times daily as needed for anxiety or sleep.   Past Week   amiodarone (PACERONE) 200 MG tablet Take 200 mg by mouth daily.   04/23/2023 Morning   atorvastatin (LIPITOR) 10 MG tablet Take 10 mg by mouth daily.   04/24/2023 Morning   carvedilol (COREG) 25 MG tablet Take 25 mg by mouth 2 (two) times daily with a meal.   04/23/2023 Bedtime   cyclobenzaprine (FLEXERIL) 10 MG tablet Take 10 mg by  mouth 3 (three) times daily.   Past Week   ferrous sulfate 325 (65 FE) MG tablet Take 325 mg by mouth daily with breakfast.   Past Week   furosemide (LASIX) 40 MG tablet Take 80 mg by mouth daily.   04/23/2023   gabapentin (NEURONTIN) 300 MG capsule Take 300 mg by mouth 3 (three) times daily.   04/23/2023 Bedtime   insulin aspart (NOVOLOG) 100 UNIT/ML injection Inject into the skin 3 (three) times daily before meals.   04/23/2023 Evening   JARDIANCE 25 MG TABS tablet Take 25 mg by mouth daily.   Past Week   LEVEMIR FLEXTOUCH 100 UNIT/ML Pen Inject 40 Units into the skin 2 (two) times daily.  12 04/23/2023 Evening   mupirocin ointment (BACTROBAN) 2 % Apply 1 Application topically 2 (two) times daily. 60 g 2 Taking   omeprazole (PRILOSEC) 40 MG capsule Take 40 mg by mouth daily.  6 04/23/2023 Morning   oxyCODONE-acetaminophen (PERCOCET) 10-325 MG per tablet Take 1 tablet by mouth See admin instructions. Take one tablet by mouth three to four times daily.   04/23/2023 Morning   potassium chloride SA (K-DUR,KLOR-CON) 20 MEQ tablet Take 20 mEq by mouth daily.   04/24/2023 Morning   ramipril (ALTACE) 10 MG capsule Take 10 mg by mouth 2 (two) times daily.   04/23/2023 Morning   Semaglutide, 2 MG/DOSE, (OZEMPIC, 2 MG/DOSE,) 8 MG/3ML SOPN Inject 2 mg into the skin once a week.   04/17/2023   sertraline (ZOLOFT) 100 MG tablet Take 100 mg by mouth daily.   04/23/2023 Morning   triamcinolone cream (KENALOG) 0.1 % Apply 1 application topically 2 (two) times daily as needed (dry skin).    Past Week   warfarin (COUMADIN) 5 MG tablet Take 1 tablet (5 mg total) by mouth daily. 90 tablet 0 04/20/2023 Noon     Social History: @IPSOCHX @  Family History: Family History  Problem Relation Age of Onset   Heart disease Father    Hypertension Father    Anxiety disorder Sister    Diabetes Mellitus II Sister    Hypertension Sister    Colon polyps Sister    Hyperlipidemia Sister    Anxiety disorder Brother    Heart disease  Brother    Depression Brother    Hyperlipidemia Brother     Review of Systems: As per HPI. A complete 10 point review of systems was performed and all other systems reviewed were negative.  Physical Exam: Vitals:   04/24/23 0652  BP: (!) 145/69  Pulse: 83  Resp: 18  Temp: 98.3 F (36.8 C)  SpO2: 100%    Lower Extremity Physical Exam  Vasc: Foot is warm and well-perfused, faintly palpable DP and PT pulses.  Pedal hair is absent.  Edema to the  third toe Neuro: Protective sensation diminished.  Gross light touch sensation intact MSK: Hammertoe contractures of lesser digits noted, semireducible Derm: Ulceration present to the left great toe with nail involvement.  Associated edema and erythema.  No drainage today.    Physical Exam   Labs:  - Reviewed prior to examination  Radiology:  - Reviewed prior to examination  Assessment: Donna Hopkins is 68 y.o. female with diabetic foot infection and associated left third toe ulcer.  Osteomyelitis involving the third toe. Medical history as noted above  Plan: - Will proceed with surgical management involving partial amputation of the left 3rd toe. - Procedure, benefits, risks, and alternatives discussed with patient and informed consent obtained. - Prophylactic antibiotics: IV Ancef 2 grams. - Surgical site marked. - Patient has follow up in my office - Will continue antibiotics during postoperative period -May weight-bear as tolerated in surgical shoe left  Barbaraann Share

## 2023-04-24 NOTE — Progress Notes (Signed)
Post op antibiotics sent to home pharmacy

## 2023-04-24 NOTE — Op Note (Signed)
Patient Name: Donna Hopkins DOB: 1955/07/22  MRN: [id number]   Date of Service: 04/24/2023  Surgeon: Dr. Bronwen Betters, DPM Assistants: None Pre-operative Diagnosis:  OSTEOMYELITIS LEFT THIRD TOE Post-operative Diagnosis:  OSTEOMYELITIS LEFT THIRD TOE Procedures: Procedures:   * AMPUTATION TOE THIRD Left foot Pathology/Specimens: ID Type Source Tests Collected by Time Destination  1 : left third toe Tissue PATH Digit amputation SURGICAL PATHOLOGY Barbaraann Share, DPM 04/24/2023 4098   A : left third toe for culture Tissue PATH Digit amputation AEROBIC/ANAEROBIC CULTURE W GRAM STAIN (SURGICAL/DEEP WOUND) Barbaraann Share, DPM 04/24/2023 0846    Anesthesia: MAC with local block Hemostasis: * No tourniquets in log * Estimated Blood Loss: 5 mL Materials: * No implants in log * Medications: Ancef 2 g, 10 cc 1% Lidocaine plain, 10 cc 0.5% Sensorcaine plain Complications: none  Indications for Procedure:  This is a 68 y.o. female with a nonhealing ulcer to the left third toe.  The wound progressed despite local wound care and antibiotics.  Was found to be developing bone infection involving the distal phalanx based on radiographs and clinical examination.  At this point, surgical intervention was recommended describing partial amputation of the third toe.  Risk, benefits alternative therapies were discussed with the patient at length.  Patient was agreeable to proceeding.   Procedure in Detail: Patient was identified in pre-operative holding area. Formal consent was signed and the left lower extremity was marked. Patient was brought back to the operating room. Anesthesia was induced.  A local block was administered to the left foot injecting a total of 20 cc of a one-to-one ratio of 1% lidocaine plain and 0.5% Sensorcaine plain in a local fashion.  The extremity was prepped and draped in the usual sterile fashion. Timeout was taken to confirm patient name, laterality, and procedure prior to incision.    Attention was then directed to the left third toe where distal ulceration was appreciated.  A modified fishmouth incision was made using a 15 blade.  Incision was made full-thickness down to level of bone.  The distal interphalangeal joint was identified and the distal phalanx was disarticulated.  Wound swab for culture was obtained of the surgical site.  A portion of the resected bone was removed and sent for microbiology.  The remainding amputated specimen was passed off the surgical field and sent off for pathology.  Of note, scant bleeding was noted to the surgical site.  The surgical site was irrigated copious amount of normal sterile saline.  The surgical site was closed using 3-0 Prolene in a simple interrupted technique under minimal tension.  The foot was then dressed with Betadine soaked Adaptic, 4 x 4 gauze, Kerlix, Ace wrap. Patient tolerated the procedure well.  She was transferred from the operating room to PACU hemodynamically stable.   Disposition: Following a period of post-operative monitoring, patient will be transferred home where she will continue to follow in my care.  She may weight-bear as tolerated to the left foot with surgical shoe.  Anticipate clean surgical margins.

## 2023-04-25 ENCOUNTER — Encounter: Payer: 59 | Admitting: Podiatry

## 2023-04-25 ENCOUNTER — Encounter (HOSPITAL_COMMUNITY): Payer: Self-pay | Admitting: Podiatry

## 2023-04-25 ENCOUNTER — Encounter: Payer: Self-pay | Admitting: Cardiology

## 2023-04-26 ENCOUNTER — Telehealth: Payer: Self-pay

## 2023-04-26 LAB — SURGICAL PATHOLOGY

## 2023-04-26 NOTE — Progress Notes (Signed)
EPIC Encounter for ICM Monitoring  Patient Name: Mercedies Ganesh is a 68 y.o. female Date: 04/26/2023 Primary Care Physican: Elinor Dodge, MD Primary Cardiologist: Hanley Hays Electrophysiologist: Townsend Roger Pacing: >99%         03/19/2022 Office Weight: 225 lbs 01/14/2023 Weight: Not weighing at home   AT/AF Burden:  0% (takes Warfarin)                                                                  Attempted call to patient and unable to reach. Transmission results reviewed.            Diet: Typically does not follow low salt diet and eats a lot of ice resulting in drinking > 64 oz daily   [place] Thoracic impedance suggesting normal fluid levels with the exception of possible fluid accumulation from 2/1-2/9.   Prescribed: Furosemide 80 mg Take 1 tablet (80 mg total) by mouth daily   Potassium 20 mEq take 1 tablet daily.   Labs: 04/18/2023 Creatinine 0.76, BUN 16, Potassium 4.0, Sodium 137, GFR >60 12/14/2022 Creatinine 0.85, BUN 18, Potassium 3.8, Sodium 138, GFR 74 11/10/2022 Creatinine 1.26, BUN 16, Potassium 3.7, Sodium 132, GFR 46  11/09/2022 Creatinine 0.81, BUN 13, Potassium 3.6, Sodium 134, GFR >60  11/08/2022 Creatinine 0.86, BUN 15, Potassium 3.4, Sodium 132, GFR >60  11/07/2022 Creatinine 0.74, BUN 11, Potassium 3.7, Sodium 134, GFR >60 11/06/2022 Creatinine 0.82, BUN 13, Potassium 3.8, Sodium 134, GFR >60  A complete set of results can be found in Results Review..   Recommendations:  Unable to reach.     Follow-up plan: ICM clinic phone appointment on 05/27/2023.   91 day device clinic remote transmission 07/22/2023.     EP/Cardiology Office Visits:   05/17/2023 with Otilio Saber, PA   Copy of ICM check sent to Dr. Lalla Brothers.   3 month ICM trend: 04/23/2023.    12-14 Month ICM trend:     Karie Soda, RN 04/26/2023 10:14 AM

## 2023-04-26 NOTE — Telephone Encounter (Signed)
Remote ICM transmission received.  Attempted call to patient regarding ICM remote transmission and no answer.

## 2023-04-27 LAB — AEROBIC/ANAEROBIC CULTURE W GRAM STAIN (SURGICAL/DEEP WOUND): Gram Stain: NONE SEEN

## 2023-05-02 ENCOUNTER — Ambulatory Visit (INDEPENDENT_AMBULATORY_CARE_PROVIDER_SITE_OTHER): Payer: 59 | Admitting: Podiatry

## 2023-05-02 ENCOUNTER — Encounter: Payer: Self-pay | Admitting: Podiatry

## 2023-05-02 VITALS — Ht 69.5 in | Wt 209.0 lb

## 2023-05-02 DIAGNOSIS — M869 Osteomyelitis, unspecified: Secondary | ICD-10-CM | POA: Diagnosis not present

## 2023-05-02 NOTE — Progress Notes (Signed)
 Subjective:  Patient ID: Donna Hopkins, female    DOB: 1955-09-28,  MRN: [id number]  Chief Complaint  Patient presents with   Routine Post Op    POV #1 DOS 04/24/2023 LEFT THIRD TOE PARTIAL AMPUTATION, " I had some pain in the toes to the leg the other day but doing well and I noticed it was black, and no discharge that I seen"    DOS: 04/24/2023 Procedure: Left third toe partial amputation  68 y.o. female returns for post-op check.  She has been doing well.  Denies pain.  Has been using postop shoe.  Review of Systems: Negative except as noted in the HPI. Denies N/V/F/Ch.  Past Medical History:  Diagnosis Date   Anemia    Anxiety    Arthritis    Carotid stenosis, asymptomatic, bilateral    CHF (congestive heart failure) (HCC)    Chronic kidney disease, stage III (moderate) (HCC)    Chronic low back pain    Clinical systolic heart failure, chronic (HCC)    Depression    Diabetes mellitus    Eosinophilic gastritis    GERD (gastroesophageal reflux disease)    Gout, renal disease    H/O mechanical aortic valve replacement    on coumadin   Hx of aortic valve replacement    Hypertension    Hypocalcemia    Hyponatremia    ICD (implantable cardioverter-defibrillator) in place    Medication refill    Mixed hyperlipidemia    Neuropathy    Obesity    OP (osteoporosis)    Peripheral vascular disease, unspecified (HCC)    Persistent insomnia    Personal history of colonic polyps    Pulmonary hypertension (HCC)    Snoring    declines sleep study   Tricuspid regurgitation    Uncontrolled type 2 diabetes mellitus with complication    Valgus foot    Vertigo, benign positional    Vitamin D deficiency     Current Outpatient Medications:    acetaminophen (TYLENOL) 650 MG CR tablet, Take 1,300 mg by mouth daily as needed for pain., Disp: , Rfl:    albuterol (PROVENTIL HFA;VENTOLIN HFA) 108 (90 BASE) MCG/ACT inhaler, Inhale 2 puffs into the lungs every 6 (six) hours as needed for  wheezing or shortness of breath. , Disp: , Rfl:    ALPRAZolam (XANAX) 0.5 MG tablet, Take 0.5 mg by mouth 2 (two) times daily as needed for anxiety or sleep., Disp: , Rfl:    amiodarone (PACERONE) 200 MG tablet, Take 200 mg by mouth daily., Disp: , Rfl:    atorvastatin (LIPITOR) 10 MG tablet, Take 10 mg by mouth daily., Disp: , Rfl:    carvedilol (COREG) 25 MG tablet, Take 25 mg by mouth 2 (two) times daily with a meal., Disp: , Rfl:    cyclobenzaprine (FLEXERIL) 10 MG tablet, Take 10 mg by mouth 3 (three) times daily., Disp: , Rfl:    ferrous sulfate 325 (65 FE) MG tablet, Take 325 mg by mouth daily with breakfast., Disp: , Rfl:    furosemide (LASIX) 40 MG tablet, Take 80 mg by mouth daily., Disp: , Rfl:    gabapentin (NEURONTIN) 300 MG capsule, Take 300 mg by mouth 3 (three) times daily., Disp: , Rfl:    insulin aspart (NOVOLOG) 100 UNIT/ML injection, Inject into the skin 3 (three) times daily before meals., Disp: , Rfl:    JARDIANCE 25 MG TABS tablet, Take 25 mg by mouth daily., Disp: , Rfl:    LEVEMIR  FLEXTOUCH 100 UNIT/ML Pen, Inject 40 Units into the skin 2 (two) times daily., Disp: , Rfl: 12   mupirocin ointment (BACTROBAN) 2 %, Apply 1 Application topically 2 (two) times daily., Disp: 60 g, Rfl: 2   omeprazole (PRILOSEC) 40 MG capsule, Take 40 mg by mouth daily., Disp: , Rfl: 6   oxyCODONE-acetaminophen (PERCOCET) 10-325 MG per tablet, Take 1 tablet by mouth See admin instructions. Take one tablet by mouth three to four times daily., Disp: , Rfl:    potassium chloride SA (K-DUR,KLOR-CON) 20 MEQ tablet, Take 20 mEq by mouth daily., Disp: , Rfl:    ramipril (ALTACE) 10 MG capsule, Take 10 mg by mouth 2 (two) times daily., Disp: , Rfl:    Semaglutide, 2 MG/DOSE, (OZEMPIC, 2 MG/DOSE,) 8 MG/3ML SOPN, Inject 2 mg into the skin once a week., Disp: , Rfl:    sertraline (ZOLOFT) 100 MG tablet, Take 100 mg by mouth daily., Disp: , Rfl:    triamcinolone cream (KENALOG) 0.1 %, Apply 1 application  topically 2 (two) times daily as needed (dry skin). , Disp: , Rfl:    warfarin (COUMADIN) 5 MG tablet, Take 1 tablet (5 mg total) by mouth daily., Disp: 90 tablet, Rfl: 0  Social History   Tobacco Use  Smoking Status Former   Types: Cigarettes  Smokeless Tobacco Never    Allergies  Allergen Reactions   Canagliflozin Shortness Of Breath   Metformin Diarrhea    Other Reaction(s): Diarrhea   Tetanus Toxoids     Infection at injection site   Objective:  There were no vitals filed for this visit. Body mass index is 30.42 kg/m. Constitutional Well developed. Well nourished.  Vascular Foot warm and well perfused. Capillary refill normal to all digits including left third toe amputation site.  Diminished pedal hair growth.    Neurologic Normal speech. Oriented to person, place, and time. Epicritic sensation to light touch grossly present bilaterally.  Dermatologic Stitches intact to left third toe partial amputation site.  Skin healing well without signs of infection. Skin edges well coapted without signs of infection.  Orthopedic: Tenderness to palpation noted about the surgical site.   Radiographs: Left foot 2/12 postoperative radiographs showing partial third toe amputation at level of distal interphalangeal joint. Assessment:   1. Osteomyelitis of toe of left foot (HCC)    Plan:  Patient was evaluated and treated and all questions answered.  S/p foot surgery left -Progressing as expected post-operatively. -XR: Deferred -WB Status: Weightbearing as tolerated in surgical shoe left foot -Sutures: Left intact. -Medications: Complete course of oral doxycycline 100 mg twice daily.  Surgical pathology reviewed with patient.  Anticipate clean resection margins. -Foot redressed.  Return in about 2 weeks (around 05/16/2023) for Post Op Suture Removal.

## 2023-05-05 ENCOUNTER — Encounter: Payer: Self-pay | Admitting: Podiatry

## 2023-05-16 ENCOUNTER — Ambulatory Visit (INDEPENDENT_AMBULATORY_CARE_PROVIDER_SITE_OTHER): Payer: 59 | Admitting: Podiatry

## 2023-05-16 ENCOUNTER — Encounter: Payer: Self-pay | Admitting: Podiatry

## 2023-05-16 ENCOUNTER — Encounter (HOSPITAL_BASED_OUTPATIENT_CLINIC_OR_DEPARTMENT_OTHER): Payer: Self-pay

## 2023-05-16 DIAGNOSIS — M2042 Other hammer toe(s) (acquired), left foot: Secondary | ICD-10-CM | POA: Diagnosis not present

## 2023-05-16 DIAGNOSIS — M869 Osteomyelitis, unspecified: Secondary | ICD-10-CM

## 2023-05-16 NOTE — Progress Notes (Deleted)
  Electrophysiology Office Note:   ID:  Donna Hopkins, DOB 09/27/1955, MRN [id number]  Primary Cardiologist: None Electrophysiologist: Lanier Prude, MD  {Click to update primary MD,subspecialty MD or APP then REFRESH:1}    History of Present Illness:   Donna Hopkins is a 68 y.o. female with h/o h/o mechanical MV, carotid disease, CKD III, chronic systolic CHF, HTN, HLD, pulmonary HTN and s/p CRT-D seen today for routine electrophysiology followup.   Since last being seen in our clinic the patient reports doing ***.  she denies chest pain, palpitations, dyspnea, PND, orthopnea, nausea, vomiting, dizziness, syncope, edema, weight gain, or early satiety.   Review of systems complete and found to be negative unless listed in HPI.   EP Information / Studies Reviewed:    EKG is ordered today. Personal review as below.       ICD Interrogation-  reviewed in detail today,  See PACEART report.  Arrhythmia/Device History Abbott BiV ICD implanted 2018, lead revision (abandoned lead) 04/2018 for CHF    Physical Exam:   VS:  There were no vitals taken for this visit.   Wt Readings from Last 3 Encounters:  05/02/23 209 lb (94.8 kg)  04/24/23 209 lb 7 oz (95 kg)  04/18/23 209 lb 7 oz (95 kg)     GEN: No acute distress *** NECK: No JVD; No carotid bruits CARDIAC: {EPRHYTHM:28826}, no murmurs, rubs, gallops RESPIRATORY:  Clear to auscultation without rales, wheezing or rhonchi  ABDOMEN: Soft, non-tender, non-distended EXTREMITIES:  {EDEMA LEVEL:28147::"No"} edema; No deformity   ASSESSMENT AND PLAN:    Chronic systolic CHF  s/p Abbott CRT-D  euvolemic today Stable on an appropriate medical regimen Normal ICD function See Pace Art report No changes today  H/o VT *** by device Continue amiodarone 200 mg daily.  Surveillance labs today  S/p Mechanical Valve On coumadin   Disposition:   Follow up with {EPPROVIDERS:28135} {EPFOLLOW UP:28173}   Signed, Graciella Freer,  PA-C

## 2023-05-16 NOTE — Progress Notes (Signed)
 Subjective:  Patient ID: Donna Hopkins, female    DOB: [birth year],  MRN: [id number]  Chief Complaint  Patient presents with   Routine Post Op    Patient states everything has been ok since last visit just very little pain     DOS: 04/24/2023 Procedure: Left third toe partial amputation  68 y.o. female returns for post-op check.  She has been doing well.  Denies pain.  Has been using postop shoe.  Review of Systems: Negative except as noted in the HPI. Denies N/V/F/Ch.  Past Medical History:  Diagnosis Date   Anemia    Anxiety    Arthritis    Carotid stenosis, asymptomatic, bilateral    CHF (congestive heart failure) (HCC)    Chronic kidney disease, stage III (moderate) (HCC)    Chronic low back pain    Clinical systolic heart failure, chronic (HCC)    Depression    Diabetes mellitus    Eosinophilic gastritis    GERD (gastroesophageal reflux disease)    Gout, renal disease    H/O mechanical aortic valve replacement    on coumadin   Hx of aortic valve replacement    Hypertension    Hypocalcemia    Hyponatremia    ICD (implantable cardioverter-defibrillator) in place    Medication refill    Mixed hyperlipidemia    Neuropathy    Obesity    OP (osteoporosis)    Peripheral vascular disease, unspecified (HCC)    Persistent insomnia    Personal history of colonic polyps    Pulmonary hypertension (HCC)    Snoring    declines sleep study   Tricuspid regurgitation    Uncontrolled type 2 diabetes mellitus with complication    Valgus foot    Vertigo, benign positional    Vitamin D deficiency     Current Outpatient Medications:    acetaminophen (TYLENOL) 650 MG CR tablet, Take 1,300 mg by mouth daily as needed for pain., Disp: , Rfl:    albuterol (PROVENTIL HFA;VENTOLIN HFA) 108 (90 BASE) MCG/ACT inhaler, Inhale 2 puffs into the lungs every 6 (six) hours as needed for wheezing or shortness of breath. , Disp: , Rfl:    ALPRAZolam (XANAX) 0.5 MG tablet, Take 0.5 mg by mouth  2 (two) times daily as needed for anxiety or sleep., Disp: , Rfl:    amiodarone (PACERONE) 200 MG tablet, Take 200 mg by mouth daily., Disp: , Rfl:    atorvastatin (LIPITOR) 10 MG tablet, Take 10 mg by mouth daily., Disp: , Rfl:    carvedilol (COREG) 25 MG tablet, Take 25 mg by mouth 2 (two) times daily with a meal., Disp: , Rfl:    cyclobenzaprine (FLEXERIL) 10 MG tablet, Take 10 mg by mouth 3 (three) times daily., Disp: , Rfl:    ferrous sulfate 325 (65 FE) MG tablet, Take 325 mg by mouth daily with breakfast., Disp: , Rfl:    furosemide (LASIX) 40 MG tablet, Take 80 mg by mouth daily., Disp: , Rfl:    gabapentin (NEURONTIN) 300 MG capsule, Take 300 mg by mouth 3 (three) times daily., Disp: , Rfl:    insulin aspart (NOVOLOG) 100 UNIT/ML injection, Inject into the skin 3 (three) times daily before meals., Disp: , Rfl:    JARDIANCE 25 MG TABS tablet, Take 25 mg by mouth daily., Disp: , Rfl:    LEVEMIR FLEXTOUCH 100 UNIT/ML Pen, Inject 40 Units into the skin 2 (two) times daily., Disp: , Rfl: 12   mupirocin ointment (BACTROBAN) 2 %,  Apply 1 Application topically 2 (two) times daily., Disp: 60 g, Rfl: 2   omeprazole (PRILOSEC) 40 MG capsule, Take 40 mg by mouth daily., Disp: , Rfl: 6   oxyCODONE-acetaminophen (PERCOCET) 10-325 MG per tablet, Take 1 tablet by mouth See admin instructions. Take one tablet by mouth three to four times daily., Disp: , Rfl:    potassium chloride SA (K-DUR,KLOR-CON) 20 MEQ tablet, Take 20 mEq by mouth daily., Disp: , Rfl:    ramipril (ALTACE) 10 MG capsule, Take 10 mg by mouth 2 (two) times daily., Disp: , Rfl:    Semaglutide, 2 MG/DOSE, (OZEMPIC, 2 MG/DOSE,) 8 MG/3ML SOPN, Inject 2 mg into the skin once a week., Disp: , Rfl:    sertraline (ZOLOFT) 100 MG tablet, Take 100 mg by mouth daily., Disp: , Rfl:    triamcinolone cream (KENALOG) 0.1 %, Apply 1 application topically 2 (two) times daily as needed (dry skin). , Disp: , Rfl:    warfarin (COUMADIN) 5 MG tablet, Take 1  tablet (5 mg total) by mouth daily., Disp: 90 tablet, Rfl: 0  Social History   Tobacco Use  Smoking Status Former   Types: Cigarettes  Smokeless Tobacco Never    Allergies  Allergen Reactions   Canagliflozin Shortness Of Breath   Metformin Diarrhea    Other Reaction(s): Diarrhea   Tetanus Toxoids     Infection at injection site   Objective:  There were no vitals filed for this visit. There is no height or weight on file to calculate BMI. Constitutional Well developed. Well nourished.  Vascular Foot warm and well perfused. Capillary refill normal to all digits including left third toe amputation site.  Diminished pedal hair growth.    Neurologic Normal speech. Oriented to person, place, and time. Epicritic sensation to light touch grossly present bilaterally.  Dermatologic Stitches intact to left third toe partial amputation site.  Skin incision appears well healed. Skin edges well coapted without signs of infection. Some hyperkeratotic tissue noted to incision line  Orthopedic: Tenderness to palpation noted about the surgical site.   Radiographs: Left foot 2/12 postoperative radiographs showing partial third toe amputation at level of distal interphalangeal joint. Assessment:   1. Osteomyelitis of toe of left foot (HCC)   2. Hammertoe of left foot    Plan:  Patient was evaluated and treated and all questions answered.  S/p foot surgery left -Progressing as expected post-operatively. -XR: Deferred -WB Status: Weightbearing as tolerated in surgical shoe left foot -Sutures: Removed today without incident. Steri strips applied, Patient to leave in place until they fall off on their own. -Return to regular shoe gear as tolerated.  Return in about 2 weeks (around 05/30/2023) for Diabetic Foot Care.

## 2023-05-17 ENCOUNTER — Ambulatory Visit: Payer: 59 | Admitting: Student

## 2023-05-17 ENCOUNTER — Telehealth: Payer: Self-pay | Admitting: Cardiology

## 2023-05-17 DIAGNOSIS — I472 Ventricular tachycardia, unspecified: Secondary | ICD-10-CM

## 2023-05-17 DIAGNOSIS — I5022 Chronic systolic (congestive) heart failure: Secondary | ICD-10-CM

## 2023-05-17 DIAGNOSIS — Z95810 Presence of automatic (implantable) cardiac defibrillator: Secondary | ICD-10-CM

## 2023-05-17 DIAGNOSIS — I428 Other cardiomyopathies: Secondary | ICD-10-CM

## 2023-05-17 NOTE — Telephone Encounter (Signed)
 Pt c/o medication issue:  1. Name of Medication:  Hospital medication  2. How are you currently taking this medication (dosage and times per day)?   3. Are you having a reaction (difficulty breathing--STAT)?   4. What is your medication issue?   Patient wants a call back to confirm which medication she should be taking.

## 2023-05-17 NOTE — Telephone Encounter (Signed)
Mail box full. Unable to leave voice mail.

## 2023-05-23 NOTE — Telephone Encounter (Signed)
 Spoke with the patient who states that she has no concerns at this time in regards to her medications.

## 2023-05-27 ENCOUNTER — Ambulatory Visit: Payer: 59 | Attending: Cardiology

## 2023-05-27 DIAGNOSIS — I5022 Chronic systolic (congestive) heart failure: Secondary | ICD-10-CM | POA: Diagnosis not present

## 2023-05-27 DIAGNOSIS — Z95810 Presence of automatic (implantable) cardiac defibrillator: Secondary | ICD-10-CM | POA: Diagnosis not present

## 2023-05-29 ENCOUNTER — Telehealth: Payer: Self-pay

## 2023-05-29 ENCOUNTER — Ambulatory Visit (INDEPENDENT_AMBULATORY_CARE_PROVIDER_SITE_OTHER): Admitting: Podiatry

## 2023-05-29 DIAGNOSIS — Z91198 Patient's noncompliance with other medical treatment and regimen for other reason: Secondary | ICD-10-CM

## 2023-05-29 NOTE — Telephone Encounter (Signed)
 Remote ICM transmission received.  Attempted call to patient regarding ICM remote transmission and no answer.

## 2023-05-29 NOTE — Addendum Note (Signed)
 Addended by: Geralyn Flash D on: 05/29/2023 04:47 PM   Modules accepted: Orders

## 2023-05-29 NOTE — Progress Notes (Signed)
 Remote ICD transmission.

## 2023-05-29 NOTE — Progress Notes (Signed)
 EPIC Encounter for ICM Monitoring  Patient Name: Donna Hopkins is a 68 y.o. female Date: 05/29/2023 Primary Care Physican: Elinor Dodge, MD Primary Cardiologist: Hanley Hays Electrophysiologist: Townsend Roger Pacing: >99%         03/19/2022 Office Weight: 225 lbs 01/14/2023 Weight: Not weighing at home   AT/AF Burden:  0% (takes Warfarin)                                                                  Attempted call to patient and unable to reach.  Transmission results reviewed.            Diet: Typically does not follow low salt diet and eats a lot of ice resulting in drinking > 64 oz daily   Corvue Thoracic impedance suggesting normal fluid levels with the exception of possible fluid accumulation from 2/24-3/5.   Prescribed: Furosemide 80 mg Take 1 tablet (80 mg total) by mouth daily   Potassium 20 mEq take 1 tablet daily.   Labs: 04/18/2023 Creatinine 0.76, BUN 16, Potassium 4.0, Sodium 137, GFR >60 12/14/2022 Creatinine 0.85, BUN 18, Potassium 3.8, Sodium 138, GFR 74 11/10/2022 Creatinine 1.26, BUN 16, Potassium 3.7, Sodium 132, GFR 46  11/09/2022 Creatinine 0.81, BUN 13, Potassium 3.6, Sodium 134, GFR >60  11/08/2022 Creatinine 0.86, BUN 15, Potassium 3.4, Sodium 132, GFR >60  11/07/2022 Creatinine 0.74, BUN 11, Potassium 3.7, Sodium 134, GFR >60 11/06/2022 Creatinine 0.82, BUN 13, Potassium 3.8, Sodium 134, GFR >60  A complete set of results can be found in Results Review..   Recommendations:  Unable to reach.     Follow-up plan: ICM clinic phone appointment on 07/01/2023.   91 day device clinic remote transmission 07/22/2023.     EP/Cardiology Office Visits:   06/05/2023 with Otilio Saber, PA   Copy of ICM check sent to Dr. Lalla Brothers.   3 month ICM trend: 05/27/2023.    12-14 Month ICM trend:     Karie Soda, RN 05/29/2023 10:56 AM

## 2023-06-03 NOTE — Progress Notes (Unsigned)
  Electrophysiology Office Note:   ID:  Donna Hopkins, DOB 01-22-1956, MRN [id number]  Primary Cardiologist: None Electrophysiologist: Lanier Prude, MD  {Click to update primary MD,subspecialty MD or APP then REFRESH:1}    History of Present Illness:   Donna Hopkins is a 68 y.o. female with h/o h/o mechanical MV, carotid disease, CKD III, chronic systolic CHF, HTN, HLD, pulmonary HTN and s/p CRT-D seen today for routine electrophysiology followup.   Since last being seen in our clinic the patient reports doing ***.  she denies chest pain, palpitations, dyspnea, PND, orthopnea, nausea, vomiting, dizziness, syncope, edema, weight gain, or early satiety.   Review of systems complete and found to be negative unless listed in HPI.   EP Information / Studies Reviewed:    EKG is ordered today. Personal review as below.       ICD Interrogation-  reviewed in detail today,  See PACEART report.  Arrhythmia/Device History Abbott BiV ICD implanted 2018, lead revision (abandoned lead) 04/2018 for CHF    Physical Exam:   VS:  There were no vitals taken for this visit.   Wt Readings from Last 3 Encounters:  05/02/23 209 lb (94.8 kg)  04/24/23 209 lb 7 oz (95 kg)  04/18/23 209 lb 7 oz (95 kg)     GEN: No acute distress *** NECK: No JVD; No carotid bruits CARDIAC: {EPRHYTHM:28826}, no murmurs, rubs, gallops RESPIRATORY:  Clear to auscultation without rales, wheezing or rhonchi  ABDOMEN: Soft, non-tender, non-distended EXTREMITIES:  {EDEMA LEVEL:28147::"No"} edema; No deformity   ASSESSMENT AND PLAN:    Chronic systolic CHF  s/p Abbott CRT-D  euvolemic today Stable on an appropriate medical regimen Normal ICD function See Pace Art report No changes today  H/o VT *** by device Continue amiodarone 200 mg daily.  Surveillance labs today  S/p Mechanical Valve On coumadin   Disposition:   Follow up with {EPPROVIDERS:28135} {EPFOLLOW UP:28173}   Signed, Graciella Freer,  PA-C

## 2023-06-04 NOTE — Progress Notes (Signed)
 1. Failure to attend appointment with reason given    Patient sick with back problems.

## 2023-06-05 ENCOUNTER — Encounter: Payer: Self-pay | Admitting: Student

## 2023-06-05 ENCOUNTER — Ambulatory Visit (INDEPENDENT_AMBULATORY_CARE_PROVIDER_SITE_OTHER): Admitting: Podiatry

## 2023-06-05 ENCOUNTER — Ambulatory Visit: Attending: Student | Admitting: Student

## 2023-06-05 VITALS — BP 118/64 | HR 80 | Ht 70.0 in | Wt 229.0 lb

## 2023-06-05 DIAGNOSIS — I428 Other cardiomyopathies: Secondary | ICD-10-CM | POA: Diagnosis not present

## 2023-06-05 DIAGNOSIS — Z79899 Other long term (current) drug therapy: Secondary | ICD-10-CM

## 2023-06-05 DIAGNOSIS — I472 Ventricular tachycardia, unspecified: Secondary | ICD-10-CM | POA: Diagnosis not present

## 2023-06-05 DIAGNOSIS — Z95810 Presence of automatic (implantable) cardiac defibrillator: Secondary | ICD-10-CM | POA: Diagnosis not present

## 2023-06-05 DIAGNOSIS — I5022 Chronic systolic (congestive) heart failure: Secondary | ICD-10-CM

## 2023-06-05 DIAGNOSIS — Z91198 Patient's noncompliance with other medical treatment and regimen for other reason: Secondary | ICD-10-CM

## 2023-06-05 LAB — CUP PACEART INCLINIC DEVICE CHECK
Battery Remaining Longevity: 19 mo
Brady Statistic RA Percent Paced: 0.1 %
Brady Statistic RV Percent Paced: 99.7 %
Date Time Interrogation Session: 20250326115815
HighPow Impedance: 76.5 Ohm
Implantable Lead Connection Status: 753985
Implantable Lead Connection Status: 753985
Implantable Lead Connection Status: 753985
Implantable Lead Implant Date: 20140429
Implantable Lead Implant Date: 20140617
Implantable Lead Implant Date: 20200205
Implantable Lead Location: 753858
Implantable Lead Location: 753859
Implantable Lead Location: 753860
Implantable Pulse Generator Implant Date: 20200205
Lead Channel Impedance Value: 450 Ohm
Lead Channel Impedance Value: 487.5 Ohm
Lead Channel Impedance Value: 800 Ohm
Lead Channel Pacing Threshold Amplitude: 0.75 V
Lead Channel Pacing Threshold Amplitude: 0.75 V
Lead Channel Pacing Threshold Amplitude: 0.875 V
Lead Channel Pacing Threshold Amplitude: 1.75 V
Lead Channel Pacing Threshold Pulse Width: 0.5 ms
Lead Channel Pacing Threshold Pulse Width: 0.5 ms
Lead Channel Pacing Threshold Pulse Width: 0.5 ms
Lead Channel Pacing Threshold Pulse Width: 0.7 ms
Lead Channel Sensing Intrinsic Amplitude: 12 mV
Lead Channel Sensing Intrinsic Amplitude: 4.7 mV
Lead Channel Setting Pacing Amplitude: 2 V
Lead Channel Setting Pacing Amplitude: 2.5 V
Lead Channel Setting Pacing Amplitude: 2.75 V
Lead Channel Setting Pacing Pulse Width: 0.5 ms
Lead Channel Setting Pacing Pulse Width: 0.7 ms
Lead Channel Setting Sensing Sensitivity: 0.5 mV
Pulse Gen Serial Number: 9879509

## 2023-06-05 NOTE — Patient Instructions (Signed)
 Medication Instructions:  Your physician recommends that you continue on your current medications as directed. Please refer to the Current Medication list given to you today.  *If you need a refill on your cardiac medications before your next appointment, please call your pharmacy*  Lab Work: None ordered If you have labs (blood work) drawn today and your tests are completely normal, you will receive your results only by: MyChart Message (if you have MyChart) OR A paper copy in the mail If you have any lab test that is abnormal or we need to change your treatment, we will call you to review the results.  Follow-Up: At [facility], you and your health needs are our priority.  As part of our continuing mission to provide you with exceptional heart care, we have created designated Provider Care Teams.  These Care Teams include your primary Cardiologist (physician) and Advanced Practice Providers (APPs -  Physician Assistants and Nurse Practitioners) who all work together to provide you with the care you need, when you need it.  Your next appointment:   1 year(s)  Provider:   Casimiro Needle "Otilio Saber, PA-C

## 2023-06-06 ENCOUNTER — Encounter: Payer: Self-pay | Admitting: Podiatry

## 2023-06-06 ENCOUNTER — Ambulatory Visit (INDEPENDENT_AMBULATORY_CARE_PROVIDER_SITE_OTHER): Admitting: Podiatry

## 2023-06-06 DIAGNOSIS — E1149 Type 2 diabetes mellitus with other diabetic neurological complication: Secondary | ICD-10-CM

## 2023-06-06 DIAGNOSIS — M79675 Pain in left toe(s): Secondary | ICD-10-CM

## 2023-06-06 DIAGNOSIS — M79674 Pain in right toe(s): Secondary | ICD-10-CM | POA: Diagnosis not present

## 2023-06-06 DIAGNOSIS — B351 Tinea unguium: Secondary | ICD-10-CM | POA: Diagnosis not present

## 2023-06-06 NOTE — Progress Notes (Signed)
 This patient returns to my office for at risk foot care.  This patient requires this care by a professional since this patient will be at risk due to having diabetes.  This patient is unable to cut nails herself since the patient cannot reach her nails.These nails are painful walking and wearing shoes.  This patient presents for at risk foot care today.  General Appearance  Alert, conversant and in no acute stress.  Vascular  Dorsalis pedis and posterior tibial  pulses are  weakly palpable  bilaterally.  Capillary return is within normal limits  bilaterally. Temperature is within normal limits  bilaterally.  Neurologic  Senn-Weinstein monofilament wire test within normal limits  bilaterally. Muscle power within normal limits bilaterally.  Nails Thick disfigured discolored nails with subungual debris  from hallux to fifth toes bilaterally. No evidence of bacterial infection or drainage bilaterally.  Orthopedic  No limitations of motion  feet .  No crepitus or effusions noted.  Hammer toes  B/L.  Skin  normotropic skin with no porokeratosis noted bilaterally.  No signs of infections or ulcers noted. Asymptomatic callus sub left foot.    Onychomycosis  Pain in right toes  Pain in left toes  Consent was obtained for treatment procedures.   Mechanical debridement of nails 1-5  bilaterally performed with a nail nipper.  Filed with dremel without incident.    Return office visit    3 months                  Told patient to return for periodic foot care and evaluation due to potential at risk complications. Told her to watch her second toe right which has darkened.   Helane Gunther DPM

## 2023-06-06 NOTE — Progress Notes (Signed)
 1. Failure to attend appointment with reason given    Late arrival beyond 15 minute grace period.

## 2023-06-08 ENCOUNTER — Encounter: Payer: Self-pay | Admitting: Cardiology

## 2023-07-01 ENCOUNTER — Ambulatory Visit: Attending: Cardiology

## 2023-07-01 DIAGNOSIS — I5022 Chronic systolic (congestive) heart failure: Secondary | ICD-10-CM | POA: Diagnosis not present

## 2023-07-01 DIAGNOSIS — Z95810 Presence of automatic (implantable) cardiac defibrillator: Secondary | ICD-10-CM

## 2023-07-03 ENCOUNTER — Telehealth: Payer: Self-pay

## 2023-07-03 NOTE — Progress Notes (Signed)
 EPIC Encounter for ICM Monitoring  Patient Name: Donna Hopkins is a 68 y.o. female Date: 07/03/2023 Primary Care Physican: Sedonia Dad, MD Primary Cardiologist: Lennie Ra Electrophysiologist: Kasandra Pain Pacing: >99%         03/19/2022 Office Weight: 225 lbs 01/14/2023 Weight: Not weighing at home   AT/AF Burden:  0% (takes Warfarin)                                                                  Attempted call to patient and unable to reach.    Transmission results reviewed.           Diet: Typically does not follow low salt diet and eats a lot of ice resulting in drinking > 64 oz daily   Corvue Thoracic impedance suggesting intermittent days with possible fluid accumulation within the last month.   Prescribed: Furosemide  80 mg Take 1 tablet (80 mg total) by mouth daily   Potassium 20 mEq take 1 tablet daily.   Labs: 04/18/2023 Creatinine 0.76, BUN 16, Potassium 4.0, Sodium 137, GFR >60 12/14/2022 Creatinine 0.85, BUN 18, Potassium 3.8, Sodium 138, GFR 74 11/10/2022 Creatinine 1.26, BUN 16, Potassium 3.7, Sodium 132, GFR 46  11/09/2022 Creatinine 0.81, BUN 13, Potassium 3.6, Sodium 134, GFR >60  11/08/2022 Creatinine 0.86, BUN 15, Potassium 3.4, Sodium 132, GFR >60  11/07/2022 Creatinine 0.74, BUN 11, Potassium 3.7, Sodium 134, GFR >60 11/06/2022 Creatinine 0.82, BUN 13, Potassium 3.8, Sodium 134, GFR >60  A complete set of results can be found in Results Review..   Recommendations:  Unable to reach.     Follow-up plan: ICM clinic phone appointment on 08/06/2023.   91 day device clinic remote transmission 07/22/2023.     EP/Cardiology Office Visits:   Recall 05/30/2024 with Michaelle Adolphus, PA or Dr Marven Slimmer.   Copy of ICM check sent to Dr. Marven Slimmer.    3 month ICM trend: 07/01/2023.    12-14 Month ICM trend:     Almyra Jain, RN 07/03/2023 10:15 AM

## 2023-07-03 NOTE — Telephone Encounter (Signed)
Remote ICM transmission received.  Attempted call to patient regarding ICM remote transmission and mail box is full. 

## 2023-07-22 ENCOUNTER — Ambulatory Visit (INDEPENDENT_AMBULATORY_CARE_PROVIDER_SITE_OTHER): Payer: Medicare Other

## 2023-07-22 DIAGNOSIS — I5022 Chronic systolic (congestive) heart failure: Secondary | ICD-10-CM | POA: Diagnosis not present

## 2023-07-22 DIAGNOSIS — I428 Other cardiomyopathies: Secondary | ICD-10-CM

## 2023-07-23 LAB — CUP PACEART REMOTE DEVICE CHECK
Battery Remaining Longevity: 18 mo
Battery Remaining Percentage: 26 %
Battery Voltage: 2.89 V
Brady Statistic AP VP Percent: 1 %
Brady Statistic AP VS Percent: 1 %
Brady Statistic AS VP Percent: 99 %
Brady Statistic AS VS Percent: 1 %
Brady Statistic RA Percent Paced: 1 %
Date Time Interrogation Session: 20250512020024
HighPow Impedance: 71 Ohm
HighPow Impedance: 71 Ohm
Implantable Lead Connection Status: 753985
Implantable Lead Connection Status: 753985
Implantable Lead Connection Status: 753985
Implantable Lead Implant Date: 20140429
Implantable Lead Implant Date: 20140617
Implantable Lead Implant Date: 20200205
Implantable Lead Location: 753858
Implantable Lead Location: 753859
Implantable Lead Location: 753860
Implantable Pulse Generator Implant Date: 20200205
Lead Channel Impedance Value: 440 Ohm
Lead Channel Impedance Value: 510 Ohm
Lead Channel Impedance Value: 750 Ohm
Lead Channel Pacing Threshold Amplitude: 0.75 V
Lead Channel Pacing Threshold Amplitude: 0.875 V
Lead Channel Pacing Threshold Amplitude: 1.875 V
Lead Channel Pacing Threshold Pulse Width: 0.5 ms
Lead Channel Pacing Threshold Pulse Width: 0.5 ms
Lead Channel Pacing Threshold Pulse Width: 0.7 ms
Lead Channel Sensing Intrinsic Amplitude: 12 mV
Lead Channel Sensing Intrinsic Amplitude: 3.8 mV
Lead Channel Setting Pacing Amplitude: 2 V
Lead Channel Setting Pacing Amplitude: 2.5 V
Lead Channel Setting Pacing Amplitude: 2.75 V
Lead Channel Setting Pacing Pulse Width: 0.5 ms
Lead Channel Setting Pacing Pulse Width: 0.7 ms
Lead Channel Setting Sensing Sensitivity: 0.5 mV
Pulse Gen Serial Number: 9879509

## 2023-07-28 ENCOUNTER — Ambulatory Visit: Payer: Self-pay | Admitting: Cardiology

## 2023-08-06 ENCOUNTER — Ambulatory Visit: Attending: Cardiology

## 2023-08-06 DIAGNOSIS — I5022 Chronic systolic (congestive) heart failure: Secondary | ICD-10-CM | POA: Diagnosis not present

## 2023-08-06 DIAGNOSIS — Z95810 Presence of automatic (implantable) cardiac defibrillator: Secondary | ICD-10-CM

## 2023-08-06 NOTE — Progress Notes (Signed)
 EPIC Encounter for ICM Monitoring  Patient Name: Donna Hopkins is a 68 y.o. female Date: 08/06/2023 Primary Care Physican: Sedonia Dad, MD Primary Cardiologist: Lennie Ra Electrophysiologist: Kasandra Pain Pacing: >99%         03/19/2022 Office Weight: 225 lbs 01/14/2023 Weight: Not weighing at home   AT/AF Burden:  0% (takes Warfarin)                                                                  Spoke with patient and heart failure questions reviewed.  Transmission results reviewed.  Pt asymptomatic for fluid accumulation.  Reports feeling well at this time and voices no complaints.  She has been drinking more than recommended 64 oz daily.            Diet: Typically does not follow low salt diet and eats a lot of ice resulting in drinking > 64 oz daily   Corvue Thoracic impedance suggesting possible fluid accumulation starting 5/25 but trending back toward baseline.   Prescribed: Furosemide  80 mg Take 1 tablet (80 mg total) by mouth daily   Potassium 20 mEq take 1 tablet daily.   Labs: 04/18/2023 Creatinine 0.76, BUN 16, Potassium 4.0, Sodium 137, GFR >60 12/14/2022 Creatinine 0.85, BUN 18, Potassium 3.8, Sodium 138, GFR 74 11/10/2022 Creatinine 1.26, BUN 16, Potassium 3.7, Sodium 132, GFR 46  11/09/2022 Creatinine 0.81, BUN 13, Potassium 3.6, Sodium 134, GFR >60  11/08/2022 Creatinine 0.86, BUN 15, Potassium 3.4, Sodium 132, GFR >60  11/07/2022 Creatinine 0.74, BUN 11, Potassium 3.7, Sodium 134, GFR >60 11/06/2022 Creatinine 0.82, BUN 13, Potassium 3.8, Sodium 134, GFR >60  A complete set of results can be found in Results Review..   Recommendations:   Confirmed she is taking Furosemide  80 mg daily as prescribed.  Recommendation to limit fluid intake to 64 oz daily.  Encouraged to call if experiencing any fluid symptoms.    Follow-up plan: ICM clinic phone appointment on 08/13/2023 to recheck fluid levels.   91 day device clinic remote transmission 10/21/2023.      EP/Cardiology Office Visits:   Recall 05/30/2024 with Michaelle Adolphus, PA or Dr Marven Slimmer.   Copy of ICM check sent to Dr. Marven Slimmer.    3 month ICM trend: 08/06/2023.    12-14 Month ICM trend:     Almyra Jain, RN 08/06/2023 4:10 PM

## 2023-08-13 ENCOUNTER — Ambulatory Visit: Attending: Cardiology

## 2023-08-13 DIAGNOSIS — I5022 Chronic systolic (congestive) heart failure: Secondary | ICD-10-CM

## 2023-08-13 DIAGNOSIS — Z95810 Presence of automatic (implantable) cardiac defibrillator: Secondary | ICD-10-CM

## 2023-08-13 NOTE — Progress Notes (Signed)
 EPIC Encounter for ICM Monitoring  Patient Name: Donna Hopkins is a 68 y.o. female Date: 08/13/2023 Primary Care Physican: Sedonia Dad, MD Primary Cardiologist: Lennie Ra Electrophysiologist: Kasandra Pain Pacing: >99%         06/05/2023 Office Weight: 229 lbs   AT/AF Burden:  0% (takes Warfarin)                                                                  Spoke with patient and heart failure questions reviewed.  Transmission results reviewed.  Pt asymptomatic for fluid accumulation.  Reports feeling well at this time and voices no complaints.            Diet: Typically does not follow low salt diet and eats a lot of ice resulting in drinking > 64 oz daily   Corvue Thoracic impedance suggesting fluid levels returned to normal 5/31.   Prescribed: Furosemide  80 mg Take 1 tablet (80 mg total) by mouth daily   Potassium 20 mEq take 1 tablet daily.   Labs: 04/18/2023 Creatinine 0.76, BUN 16, Potassium 4.0, Sodium 137, GFR >60 12/14/2022 Creatinine 0.85, BUN 18, Potassium 3.8, Sodium 138, GFR 74 11/10/2022 Creatinine 1.26, BUN 16, Potassium 3.7, Sodium 132, GFR 46  11/09/2022 Creatinine 0.81, BUN 13, Potassium 3.6, Sodium 134, GFR >60  11/08/2022 Creatinine 0.86, BUN 15, Potassium 3.4, Sodium 132, GFR >60  11/07/2022 Creatinine 0.74, BUN 11, Potassium 3.7, Sodium 134, GFR >60 11/06/2022 Creatinine 0.82, BUN 13, Potassium 3.8, Sodium 134, GFR >60  A complete set of results can be found in Results Review..   Recommendations: No changes and encouraged to call if experiencing any fluid symptoms.    Follow-up plan: ICM clinic phone appointment on 09/23/2023.   91 day device clinic remote transmission 10/21/2023.     EP/Cardiology Office Visits:   Recall 05/30/2024 with Michaelle Adolphus, PA or Dr Marven Slimmer.   Copy of ICM check sent to Dr. Marven Slimmer.    3 month ICM trend: 08/13/2023.    12-14 Month ICM trend:     Almyra Jain, RN 08/13/2023 7:54 AM

## 2023-09-04 ENCOUNTER — Other Ambulatory Visit: Payer: Self-pay | Admitting: Podiatry

## 2023-09-04 DIAGNOSIS — L03116 Cellulitis of left lower limb: Secondary | ICD-10-CM

## 2023-09-04 DIAGNOSIS — L97523 Non-pressure chronic ulcer of other part of left foot with necrosis of muscle: Secondary | ICD-10-CM

## 2023-09-09 ENCOUNTER — Ambulatory Visit: Admitting: Podiatry

## 2023-09-09 NOTE — Progress Notes (Signed)
 Remote ICD transmission.

## 2023-09-09 NOTE — Addendum Note (Signed)
 Addended by: TAWNI DRILLING D on: 09/09/2023 12:32 PM   Modules accepted: Orders

## 2023-09-23 ENCOUNTER — Ambulatory Visit: Attending: Cardiology

## 2023-09-23 DIAGNOSIS — I5022 Chronic systolic (congestive) heart failure: Secondary | ICD-10-CM | POA: Diagnosis not present

## 2023-09-23 DIAGNOSIS — Z95810 Presence of automatic (implantable) cardiac defibrillator: Secondary | ICD-10-CM

## 2023-09-27 ENCOUNTER — Telehealth: Payer: Self-pay

## 2023-09-27 NOTE — Progress Notes (Signed)
 EPIC Encounter for ICM Monitoring  Patient Name: Donna Hopkins is a 68 y.o. female Date: 09/27/2023 Primary Care Physican: Charlette Erla LABOR, MD Primary Cardiologist: Licia Electrophysiologist: Cindie Pore Pacing: >99%         06/05/2023 Office Weight: 229 lbs   AT/AF Burden:  0% (takes Warfarin)                                                                  Attempted call to patient and unable to reach.  Transmission results reviewed.           Diet: Typically does not follow low salt diet and eats a lot of ice resulting in drinking > 64 oz daily   Corvue Thoracic impedance suggesting intermittent days with possible fluid accumulation within the last month.   Prescribed: Furosemide  80 mg Take 1 tablet (80 mg total) by mouth daily   Potassium 20 mEq take 1 tablet daily.   Labs: 04/18/2023 Creatinine 0.76, BUN 16, Potassium 4.0, Sodium 137, GFR >60 12/14/2022 Creatinine 0.85, BUN 18, Potassium 3.8, Sodium 138, GFR 74 11/10/2022 Creatinine 1.26, BUN 16, Potassium 3.7, Sodium 132, GFR 46  11/09/2022 Creatinine 0.81, BUN 13, Potassium 3.6, Sodium 134, GFR >60  11/08/2022 Creatinine 0.86, BUN 15, Potassium 3.4, Sodium 132, GFR >60  11/07/2022 Creatinine 0.74, BUN 11, Potassium 3.7, Sodium 134, GFR >60 11/06/2022 Creatinine 0.82, BUN 13, Potassium 3.8, Sodium 134, GFR >60  A complete set of results can be found in Results Review..   Recommendations: Unable to reach.     Follow-up plan: ICM clinic phone appointment on 10/28/2023.   91 day device clinic remote transmission 10/21/2023.     EP/Cardiology Office Visits:   Recall 05/30/2024 with Jodie Passey, PA or Dr Cindie.   Copy of ICM check sent to Dr. Cindie.    3 month ICM trend: 09/23/2023.    12-14 Month ICM trend:     Mitzie GORMAN Garner, RN 09/27/2023 4:06 PM

## 2023-09-27 NOTE — Telephone Encounter (Signed)
 Remote ICM transmission received.  Attempted call to patient regarding ICM remote transmission and no answer.

## 2023-10-02 ENCOUNTER — Ambulatory Visit: Admitting: Podiatry

## 2023-10-14 ENCOUNTER — Ambulatory Visit: Admitting: Podiatry

## 2023-10-16 ENCOUNTER — Ambulatory Visit (INDEPENDENT_AMBULATORY_CARE_PROVIDER_SITE_OTHER): Admitting: Podiatry

## 2023-10-16 ENCOUNTER — Ambulatory Visit (INDEPENDENT_AMBULATORY_CARE_PROVIDER_SITE_OTHER)

## 2023-10-16 ENCOUNTER — Encounter: Payer: Self-pay | Admitting: Podiatry

## 2023-10-16 DIAGNOSIS — S8265XA Nondisplaced fracture of lateral malleolus of left fibula, initial encounter for closed fracture: Secondary | ICD-10-CM

## 2023-10-16 DIAGNOSIS — M2042 Other hammer toe(s) (acquired), left foot: Secondary | ICD-10-CM

## 2023-10-16 NOTE — Progress Notes (Signed)
 Chief Complaint  Patient presents with   Foot Injury    Pt is here due to left foot states she fell and hurt the foot, possibly broken.    HPI: 68 y.o. female PMHx T2DM, CHF, CKD stage III presenting today for evaluation of left foot and ankle pain after a sustained fall at home about 2 weeks ago.  She states that she missed a step and slipped falling on her left foot and ankle.  Denies any head trauma or LOC.  She has noticed chronic pain and swelling to the left foot and ankle since the injury.  Past Medical History:  Diagnosis Date   Anemia    Anxiety    Arthritis    Carotid stenosis, asymptomatic, bilateral    CHF (congestive heart failure) (HCC)    Chronic kidney disease, stage III (moderate) (HCC)    Chronic low back pain    Clinical systolic heart failure, chronic (HCC)    Depression    Diabetes mellitus    Eosinophilic gastritis    GERD (gastroesophageal reflux disease)    Gout, renal disease    H/O mechanical aortic valve replacement    on coumadin    Hx of aortic valve replacement    Hypertension    Hypocalcemia    Hyponatremia    ICD (implantable cardioverter-defibrillator) in place    Medication refill    Mixed hyperlipidemia    Neuropathy    Obesity    OP (osteoporosis)    Peripheral vascular disease, unspecified (HCC)    Persistent insomnia    Personal history of colonic polyps    Pulmonary hypertension (HCC)    Snoring    declines sleep study   Tricuspid regurgitation    Uncontrolled type 2 diabetes mellitus with complication    Valgus foot    Vertigo, benign positional    Vitamin D deficiency     Past Surgical History:  Procedure Laterality Date   ABDOMINAL HYSTERECTOMY     AMPUTATION TOE Left 04/24/2023   Procedure: AMPUTATION TOE THIRD;  Surgeon: Lamount Ethan CROME, DPM;  Location: [facility];  Service: Podiatry;  Laterality: Left;   BACK SURGERY     BI-VENTRICULAR IMPLANTABLE CARDIOVERTER DEFIBRILLATOR  (CRT-D)  07/08/2012   SJM Quadra Assura BiV ICD  implanted by Dr Fuller at [facility] VALVE REPLACEMENT     LEAD INSERTION N/A 04/16/2018   Successful BiV ICD system revision with a new RV lead and pulse generator replacement with a [facility] Quadra Assura MP model [phone] ICD    Allergies  Allergen Reactions   Canagliflozin Shortness Of Breath   Metformin Diarrhea    Other Reaction(s): Diarrhea   Tetanus Toxoids     Infection at injection site     Physical Exam: General: The patient is alert and oriented x3 in no acute distress.  Dermatology: Skin is warm, dry and supple bilateral lower extremities.  No open wounds or fracture blisters  Vascular: VAS US  ABI WITH/WO TBI 11/05/2022 ABI Findings:  +---------+------------------+-----+-----------+--------+  Right   Rt Pressure (mmHg)IndexWaveform   Comment   +---------+------------------+-----+-----------+--------+  Brachial 84                     triphasic            +---------+------------------+-----+-----------+--------+  PTA     90                0.93 multiphasic          +---------+------------------+-----+-----------+--------+  DP      106               1.09 multiphasic          +---------+------------------+-----+-----------+--------+  Great Toe29                0.30                      +---------+------------------+-----+-----------+--------+   +---------+------------------+-----+-----------+-------+  Left    Lt Pressure (mmHg)IndexWaveform   Comment  +---------+------------------+-----+-----------+-------+  Brachial 97                     triphasic           +---------+------------------+-----+-----------+-------+  PTA     254               2.62 multiphasic         +---------+------------------+-----+-----------+-------+  DP      97                1.00 multiphasic         +---------+------------------+-----+-----------+-------+  Great Toe58                0.60                      +---------+------------------+-----+-----------+-------+   +-------+-----------+-----------+------------+------------+  ABI/TBIToday's ABIToday's TBIPrevious ABIPrevious TBI  +-------+-----------+-----------+------------+------------+  Right 1.09       0.3                                  +-------+-----------+-----------+------------+------------+  Left  [place]         0.6                                  +-------+-----------+-----------+------------+------------+   Summary:  Right: Resting right ankle-brachial index is within normal range. The right toe-brachial index is abnormal.   Left: Resting left ankle-brachial index indicates noncompressible left lower extremity arteries. The left toe-brachial index is abnormal.   Neurological: Diminished via light touch  Musculoskeletal Exam: Chronic bilateral lower extremity edema noted  Radiographic Exam LT foot and ankle 10/16/2023:  Oblique fracture of the fibular malleolus with minimal displacement extending to the level of the tibiotalar joint  Assessment/Plan of Care: 1. Lateral malleolar ankle fracture left; closed, nondisplaced, initial encounter  -Patient evaluated.  X-rays reviewed -Cam boot dispensed.  NWB.  Physically the patient is likely not able to be completely nonweightbearing.  Recommend minimal weightbearing only as necessary.  She has a walker at home -Due to the patient's Multiple comorbidities and poor overall health we will pursue conservative treatment.  No plans for surgical ORIF -Return to clinic 4 weeks follow-up x-ray       Thresa EMERSON Sar, DPM [facility]  Dr. Thresa EMERSON Sar, DPM    [address] [place], [place] [address]                Office [phone]  Fax [phone]

## 2023-10-21 ENCOUNTER — Ambulatory Visit (INDEPENDENT_AMBULATORY_CARE_PROVIDER_SITE_OTHER): Payer: Medicare Other

## 2023-10-21 DIAGNOSIS — I5022 Chronic systolic (congestive) heart failure: Secondary | ICD-10-CM

## 2023-10-21 LAB — CUP PACEART REMOTE DEVICE CHECK
Battery Remaining Longevity: 20 mo
Battery Remaining Percentage: 28 %
Battery Voltage: 2.86 V
Brady Statistic AP VP Percent: 1 %
Brady Statistic AP VS Percent: 1 %
Brady Statistic AS VP Percent: 99 %
Brady Statistic AS VS Percent: 1 %
Brady Statistic RA Percent Paced: 1 %
Date Time Interrogation Session: 20250811020023
HighPow Impedance: 80 Ohm
HighPow Impedance: 80 Ohm
Implantable Lead Connection Status: 753985
Implantable Lead Connection Status: 753985
Implantable Lead Connection Status: 753985
Implantable Lead Implant Date: 20140429
Implantable Lead Implant Date: 20140617
Implantable Lead Implant Date: 20200205
Implantable Lead Location: 753858
Implantable Lead Location: 753859
Implantable Lead Location: 753860
Implantable Pulse Generator Implant Date: 20200205
Lead Channel Impedance Value: 450 Ohm
Lead Channel Impedance Value: 550 Ohm
Lead Channel Impedance Value: 780 Ohm
Lead Channel Pacing Threshold Amplitude: 0.75 V
Lead Channel Pacing Threshold Amplitude: 0.875 V
Lead Channel Pacing Threshold Amplitude: 1.75 V
Lead Channel Pacing Threshold Pulse Width: 0.5 ms
Lead Channel Pacing Threshold Pulse Width: 0.5 ms
Lead Channel Pacing Threshold Pulse Width: 0.7 ms
Lead Channel Sensing Intrinsic Amplitude: 12 mV
Lead Channel Sensing Intrinsic Amplitude: 4.7 mV
Lead Channel Setting Pacing Amplitude: 2 V
Lead Channel Setting Pacing Amplitude: 2.5 V
Lead Channel Setting Pacing Amplitude: 2.75 V
Lead Channel Setting Pacing Pulse Width: 0.5 ms
Lead Channel Setting Pacing Pulse Width: 0.7 ms
Lead Channel Setting Sensing Sensitivity: 0.5 mV
Pulse Gen Serial Number: 9879509

## 2023-10-22 ENCOUNTER — Ambulatory Visit: Payer: Self-pay | Admitting: Cardiology

## 2023-10-28 ENCOUNTER — Ambulatory Visit: Attending: Cardiology

## 2023-10-28 DIAGNOSIS — Z95810 Presence of automatic (implantable) cardiac defibrillator: Secondary | ICD-10-CM | POA: Diagnosis not present

## 2023-10-28 DIAGNOSIS — I5022 Chronic systolic (congestive) heart failure: Secondary | ICD-10-CM | POA: Diagnosis not present

## 2023-10-29 NOTE — Progress Notes (Signed)
 EPIC Encounter for ICM Monitoring  Patient Name: Donna Hopkins is a 68 y.o. female Date: 10/29/2023 Primary Care Physican: Charlette Erla LABOR, MD Primary Cardiologist: Licia Electrophysiologist: Cindie Pore Pacing: 99%         06/05/2023 Office Weight: 229 lbs 10/29/2023 Weight: Not weighing at home (no scale)   AT/AF Burden:  0% (takes Warfarin)                                                                  Spoke with patient and heart failure questions reviewed.  Transmission results reviewed.  Pt reports slight swelling in ankles.  She does not limit salt or fluid intake.             Diet: Typically does not follow low salt diet and eats a lot of ice resulting in drinking > 64 oz daily   Corvue Thoracic impedance suggesting possible fluid accumulation starting 8/16.  Also suggesting possible fluid accumulation from 7/20-7/30.   Prescribed: Furosemide  40 mg Take 2 tablets (80 mg total) by mouth daily   Potassium 20 mEq take 1 tablet daily.   Labs: 04/18/2023 Creatinine 0.76, BUN 16, Potassium 4.0, Sodium 137, GFR >60 12/14/2022 Creatinine 0.85, BUN 18, Potassium 3.8, Sodium 138, GFR 74 11/10/2022 Creatinine 1.26, BUN 16, Potassium 3.7, Sodium 132, GFR 46  11/09/2022 Creatinine 0.81, BUN 13, Potassium 3.6, Sodium 134, GFR >60  11/08/2022 Creatinine 0.86, BUN 15, Potassium 3.4, Sodium 132, GFR >60  11/07/2022 Creatinine 0.74, BUN 11, Potassium 3.7, Sodium 134, GFR >60 11/06/2022 Creatinine 0.82, BUN 13, Potassium 3.8, Sodium 134, GFR >60  A complete set of results can be found in Results Review..   Recommendations: Advised to take Furosemide  2 tablets (80 mg) and extra 1 tablet (40 mg) in the afternoon x 2 days only.   Also take extra Potassium 20 mEq 1 tablet in the afternoon with extra lasix  x 2 days only.  After 2nd day return to her normal dosage of Lasix  80 mg every morning with Potassium 20 mEq every morning.  She agreed to plan and verbalized understanding of  instructions.  Advised to reduce salt and fluid intake.    Follow-up plan: ICM clinic phone appointment on 11/04/2023 to recheck fluid levels.   91 day device clinic remote transmission 01/20/2024.     EP/Cardiology Office Visits:   Recall 05/30/2024 with Jodie Passey, PA or Dr Cindie.   Copy of ICM check sent to Dr. Cindie.    3 month ICM trend: 10/28/2023.    12-14 Month ICM trend:     Mitzie GORMAN Garner, RN 10/29/2023 8:02 AM

## 2023-11-04 ENCOUNTER — Ambulatory Visit: Attending: Cardiology

## 2023-11-04 DIAGNOSIS — Z95810 Presence of automatic (implantable) cardiac defibrillator: Secondary | ICD-10-CM

## 2023-11-04 DIAGNOSIS — I5022 Chronic systolic (congestive) heart failure: Secondary | ICD-10-CM

## 2023-11-06 ENCOUNTER — Telehealth: Payer: Self-pay

## 2023-11-06 NOTE — Progress Notes (Signed)
 EPIC Encounter for ICM Monitoring  Patient Name: Donna Hopkins is a 68 y.o. female Date: 11/06/2023 Primary Care Physican: Charlette Erla LABOR, MD Primary Cardiologist: Licia Electrophysiologist: Cindie Pore Pacing: 99%         06/05/2023 Office Weight: 229 lbs 10/29/2023 Weight: Not weighing at home (no scale)   AT/AF Burden:  0% (takes Warfarin)                                                                  Attempted call to patient and unable to reach.   Transmission results reviewed.              Diet: Typically does not follow low salt diet and eats a lot of ice resulting in drinking > 64 oz daily   Corvue Thoracic impedance suggesting possible fluid accumulation starting 8/16 and returned to normal 8/20 after taking extra 40 mg Lasix  x 2 days.    Prescribed: Furosemide  40 mg Take 2 tablets (80 mg total) by mouth daily   Potassium 20 mEq take 1 tablet daily.   Labs: 04/18/2023 Creatinine 0.76, BUN 16, Potassium 4.0, Sodium 137, GFR >60 12/14/2022 Creatinine 0.85, BUN 18, Potassium 3.8, Sodium 138, GFR 74 11/10/2022 Creatinine 1.26, BUN 16, Potassium 3.7, Sodium 132, GFR 46  11/09/2022 Creatinine 0.81, BUN 13, Potassium 3.6, Sodium 134, GFR >60  11/08/2022 Creatinine 0.86, BUN 15, Potassium 3.4, Sodium 132, GFR >60  11/07/2022 Creatinine 0.74, BUN 11, Potassium 3.7, Sodium 134, GFR >60 11/06/2022 Creatinine 0.82, BUN 13, Potassium 3.8, Sodium 134, GFR >60  A complete set of results can be found in Results Review..   Recommendations:   Unable to reach.     Follow-up plan: ICM clinic phone appointment on 12/02/2023.   91 day device clinic remote transmission 01/20/2024.     EP/Cardiology Office Visits:   Recall 05/30/2024 with Jodie Passey, PA or Dr Cindie.   Copy of ICM check sent to Dr. Cindie.    3 month ICM trend: 11/04/2023.    12-14 Month ICM trend:     Mitzie GORMAN Garner, RN 11/06/2023 4:31 PM

## 2023-11-06 NOTE — Telephone Encounter (Signed)
 Remote ICM transmission received.  Attempted call to patient regarding ICM remote transmission and no answer.

## 2023-11-18 ENCOUNTER — Ambulatory Visit: Admitting: Podiatry

## 2023-12-02 ENCOUNTER — Ambulatory Visit (INDEPENDENT_AMBULATORY_CARE_PROVIDER_SITE_OTHER)

## 2023-12-02 ENCOUNTER — Ambulatory Visit (INDEPENDENT_AMBULATORY_CARE_PROVIDER_SITE_OTHER): Admitting: Podiatry

## 2023-12-02 ENCOUNTER — Encounter: Payer: Self-pay | Admitting: Podiatry

## 2023-12-02 ENCOUNTER — Ambulatory Visit: Attending: Cardiology

## 2023-12-02 VITALS — Ht 70.0 in | Wt 229.0 lb

## 2023-12-02 DIAGNOSIS — Z95810 Presence of automatic (implantable) cardiac defibrillator: Secondary | ICD-10-CM | POA: Diagnosis not present

## 2023-12-02 DIAGNOSIS — S8265XA Nondisplaced fracture of lateral malleolus of left fibula, initial encounter for closed fracture: Secondary | ICD-10-CM

## 2023-12-02 DIAGNOSIS — I5022 Chronic systolic (congestive) heart failure: Secondary | ICD-10-CM | POA: Diagnosis not present

## 2023-12-02 NOTE — Progress Notes (Signed)
 Chief Complaint  Patient presents with   Ankle Injury    Pt is here to f/u on left ankle fracture, she states there is still some pain in the ankle, states she stays in the bed so she wont be on her foot.    HPI: 68 y.o. female PMHx T2DM, CHF, CKD stage III presenting today for follow-up evaluation of left ankle fracture.  She has been WBAT in the cam boot as instructed.  No new complaints  Brief history: She states that she missed a step and slipped falling on her left foot and ankle.  Denied any head trauma or LOC.  She has noticed chronic pain and swelling to the left foot and ankle after the injury.  DOI: ~10/10/2023  Past Medical History:  Diagnosis Date   Anemia    Anxiety    Arthritis    Carotid stenosis, asymptomatic, bilateral    CHF (congestive heart failure) (HCC)    Chronic kidney disease, stage III (moderate) (HCC)    Chronic low back pain    Clinical systolic heart failure, chronic (HCC)    Depression    Diabetes mellitus    Eosinophilic gastritis    GERD (gastroesophageal reflux disease)    Gout, renal disease    H/O mechanical aortic valve replacement    on coumadin    Hx of aortic valve replacement    Hypertension    Hypocalcemia    Hyponatremia    ICD (implantable cardioverter-defibrillator) in place    Medication refill    Mixed hyperlipidemia    Neuropathy    Obesity    OP (osteoporosis)    Peripheral vascular disease, unspecified    Persistent insomnia    Personal history of colonic polyps    Pulmonary hypertension (HCC)    Snoring    declines sleep study   Tricuspid regurgitation    Uncontrolled type 2 diabetes mellitus with complication    Valgus foot    Vertigo, benign positional    Vitamin D deficiency     Past Surgical History:  Procedure Laterality Date   ABDOMINAL HYSTERECTOMY     AMPUTATION TOE Left 04/24/2023   Procedure: AMPUTATION TOE THIRD;  Surgeon: Lamount Ethan CROME, DPM;  Location: [facility];  Service: Podiatry;  Laterality: Left;    BACK SURGERY     BI-VENTRICULAR IMPLANTABLE CARDIOVERTER DEFIBRILLATOR  (CRT-D)  07/08/2012   SJM Quadra Assura BiV ICD implanted by Dr Fuller at [facility] VALVE REPLACEMENT     LEAD INSERTION N/A 04/16/2018   Successful BiV ICD system revision with a new RV lead and pulse generator replacement with a [facility] Quadra Assura MP model [phone] ICD    Allergies  Allergen Reactions   Canagliflozin Shortness Of Breath   Metformin Diarrhea    Other Reaction(s): Diarrhea   Tetanus Toxoid-Containing Vaccines     Infection at injection site     Physical Exam: General: The patient is alert and oriented x3 in no acute distress.  Dermatology: Skin is warm, dry and supple bilateral lower extremities.  No open wounds or fracture blisters  Vascular: VAS US  ABI WITH/WO TBI 11/05/2022 ABI Findings:  +---------+------------------+-----+-----------+--------+  Right   Rt Pressure (mmHg)IndexWaveform   Comment   +---------+------------------+-----+-----------+--------+  Brachial 84                     triphasic            +---------+------------------+-----+-----------+--------+  PTA  90                0.93 multiphasic          +---------+------------------+-----+-----------+--------+  DP      106               1.09 multiphasic          +---------+------------------+-----+-----------+--------+  Great Toe29                0.30                      +---------+------------------+-----+-----------+--------+   +---------+------------------+-----+-----------+-------+  Left    Lt Pressure (mmHg)IndexWaveform   Comment  +---------+------------------+-----+-----------+-------+  Brachial 97                     triphasic           +---------+------------------+-----+-----------+-------+  PTA     254               2.62 multiphasic         +---------+------------------+-----+-----------+-------+  DP      97                1.00  multiphasic         +---------+------------------+-----+-----------+-------+  Great Toe58                0.60                     +---------+------------------+-----+-----------+-------+   +-------+-----------+-----------+------------+------------+  ABI/TBIToday's ABIToday's TBIPrevious ABIPrevious TBI  +-------+-----------+-----------+------------+------------+  Right 1.09       0.3                                  +-------+-----------+-----------+------------+------------+  Left  [place]         0.6                                  +-------+-----------+-----------+------------+------------+   Summary:  Right: Resting right ankle-brachial index is within normal range. The right toe-brachial index is abnormal.   Left: Resting left ankle-brachial index indicates noncompressible left lower extremity arteries. The left toe-brachial index is abnormal.   Neurological: Diminished via light touch  Musculoskeletal Exam: Chronic bilateral lower extremity edema noted  Radiographic Exam LT foot and ankle 12/02/2023:  Oblique fracture of the fibular malleolus minimally displaced extending to the level of the tibiotalar joint w/ callus formation developing around the fracture sue  Assessment/Plan of Care: 1. Lateral malleolar ankle fracture left; closed, nondisplaced, initial encounter  -Patient evaluated.  X-rays reviewed -Continue CAM walker WBAT. Patient is minimally ambulatory -Return to clinic 8 weeks f/u xrays       Thresa EMERSON Sar, DPM [facility]  Dr. Thresa EMERSON Sar, DPM    [address] [place], [place] [address]                Office [phone]  Fax [phone]

## 2023-12-03 ENCOUNTER — Telehealth: Payer: Self-pay

## 2023-12-03 NOTE — Telephone Encounter (Signed)
 Remote ICM transmission received.  Attempted call to patient regarding ICM remote transmission and no answer.

## 2023-12-03 NOTE — Progress Notes (Signed)
 EPIC Encounter for ICM Monitoring  Patient Name: Donna Hopkins is a 68 y.o. female Date: 12/03/2023 Primary Care Physican: Charlette Erla LABOR, MD Primary Cardiologist: Licia Electrophysiologist: Cindie Pore Pacing: 99%         06/05/2023 Office Weight: 229 lbs 10/29/2023 Weight: Not weighing at home (no scale)   AT/AF Burden:  0% (takes Warfarin)                                                                  Attempted call to patient and unable to reach.   Transmission results reviewed.              Diet: Typically does not follow low salt diet and eats a lot of ice resulting in drinking > 64 oz daily   Corvue Thoracic impedance suggesting possible fluid accumulation from 9/4-9/7 and possible dryness from 9/8-9/16.  Back at baseline 9/17.    Prescribed: Furosemide  40 mg Take 2 tablets (80 mg total) by mouth daily   Potassium 20 mEq take 1 tablet daily.   Labs: 04/18/2023 Creatinine 0.76, BUN 16, Potassium 4.0, Sodium 137, GFR >60 12/14/2022 Creatinine 0.85, BUN 18, Potassium 3.8, Sodium 138, GFR 74 11/10/2022 Creatinine 1.26, BUN 16, Potassium 3.7, Sodium 132, GFR 46  11/09/2022 Creatinine 0.81, BUN 13, Potassium 3.6, Sodium 134, GFR >60  11/08/2022 Creatinine 0.86, BUN 15, Potassium 3.4, Sodium 132, GFR >60  11/07/2022 Creatinine 0.74, BUN 11, Potassium 3.7, Sodium 134, GFR >60 11/06/2022 Creatinine 0.82, BUN 13, Potassium 3.8, Sodium 134, GFR >60  A complete set of results can be found in Results Review..   Recommendations:   Unable to reach.     Follow-up plan: ICM clinic phone appointment on 01/06/2024.   91 day device clinic remote transmission 01/20/2024.     EP/Cardiology Office Visits:   Recall 05/30/2024 with Jodie Passey, PA or Dr Cindie.   Copy of ICM check sent to Dr. Cindie.    3 month ICM trend: 12/02/2023.    12-14 Month ICM trend:     Donna GORMAN Garner, RN 12/03/2023 1:12 PM

## 2023-12-06 NOTE — Progress Notes (Signed)
Remote ICD Transmission.

## 2024-01-06 ENCOUNTER — Ambulatory Visit: Attending: Cardiology

## 2024-01-06 DIAGNOSIS — I5022 Chronic systolic (congestive) heart failure: Secondary | ICD-10-CM | POA: Diagnosis not present

## 2024-01-06 DIAGNOSIS — Z95810 Presence of automatic (implantable) cardiac defibrillator: Secondary | ICD-10-CM

## 2024-01-08 ENCOUNTER — Telehealth: Payer: Self-pay

## 2024-01-08 NOTE — Progress Notes (Signed)
 EPIC Encounter for ICM Monitoring  Patient Name: Donna Hopkins is a 68 y.o. female Date: 01/08/2024 Primary Care Physican: Charlette Erla LABOR, MD Primary Cardiologist: Licia Electrophysiologist: Cindie Pore Pacing: 99%         06/05/2023 Office Weight: 229 lbs 10/29/2023 Weight: Not weighing at home (no scale)   AT/AF Burden:  0% (takes Warfarin)                                                                  Attempted call to patient and unable to reach.    Transmission results reviewed.           Diet: Typically does not follow low salt diet and eats a lot of ice resulting in drinking > 64 oz daily   Since 12/02/2023 ICM Remote Transmission: Corvue Thoracic impedance suggesting possible fluid accumulation from 12/06/2023-12/11/2023 and 12/15/2023-12/24/2023.    Prescribed: Furosemide  40 mg Take 2 tablets (80 mg total) by mouth daily   Potassium 20 mEq take 1 tablet daily.   Labs: 04/18/2023 Creatinine 0.76, BUN 16, Potassium 4.0, Sodium 137, GFR >60 A complete set of results can be found in Results Review..   Recommendations:   Unable to reach.     Follow-up plan: ICM clinic phone appointment on 02/10/2024.   91 day device clinic remote transmission 01/20/2024.     EP/Cardiology Office Visits:   Recall 05/30/2024 with Jodie Passey, PA or Dr Cindie.   Copy of ICM check sent to Dr. Cindie.    Remote monitoring is medically necessary for Heart Failure Management.    Daily Thoracic Impedance ICM trend: 10/08/2023 through 01/06/2024.    12-14 Month Thoracic Impedance ICM trend:     Mitzie GORMAN Garner, RN 01/08/2024 9:51 AM

## 2024-01-08 NOTE — Telephone Encounter (Signed)
Remote ICM transmission received.  Attempted call to patient regarding ICM remote transmission and mail box is full. 

## 2024-01-20 ENCOUNTER — Ambulatory Visit (INDEPENDENT_AMBULATORY_CARE_PROVIDER_SITE_OTHER): Payer: Medicare Other

## 2024-01-20 DIAGNOSIS — I5022 Chronic systolic (congestive) heart failure: Secondary | ICD-10-CM

## 2024-01-20 LAB — CUP PACEART REMOTE DEVICE CHECK
Battery Remaining Longevity: 19 mo
Battery Remaining Percentage: 28 %
Battery Voltage: 2.84 V
Brady Statistic AP VP Percent: 1 %
Brady Statistic AP VS Percent: 1 %
Brady Statistic AS VP Percent: 99 %
Brady Statistic AS VS Percent: 1 %
Brady Statistic RA Percent Paced: 1 %
Date Time Interrogation Session: 20251110020017
HighPow Impedance: 68 Ohm
HighPow Impedance: 68 Ohm
Implantable Lead Connection Status: 753985
Implantable Lead Connection Status: 753985
Implantable Lead Connection Status: 753985
Implantable Lead Implant Date: 20140429
Implantable Lead Implant Date: 20140617
Implantable Lead Implant Date: 20200205
Implantable Lead Location: 753858
Implantable Lead Location: 753859
Implantable Lead Location: 753860
Implantable Pulse Generator Implant Date: 20200205
Lead Channel Impedance Value: 400 Ohm
Lead Channel Impedance Value: 480 Ohm
Lead Channel Impedance Value: 740 Ohm
Lead Channel Pacing Threshold Amplitude: 0.75 V
Lead Channel Pacing Threshold Amplitude: 0.875 V
Lead Channel Pacing Threshold Amplitude: 2 V
Lead Channel Pacing Threshold Pulse Width: 0.5 ms
Lead Channel Pacing Threshold Pulse Width: 0.5 ms
Lead Channel Pacing Threshold Pulse Width: 0.7 ms
Lead Channel Sensing Intrinsic Amplitude: 12 mV
Lead Channel Sensing Intrinsic Amplitude: 2.7 mV
Lead Channel Setting Pacing Amplitude: 2 V
Lead Channel Setting Pacing Amplitude: 2.5 V
Lead Channel Setting Pacing Amplitude: 2.75 V
Lead Channel Setting Pacing Pulse Width: 0.5 ms
Lead Channel Setting Pacing Pulse Width: 0.7 ms
Lead Channel Setting Sensing Sensitivity: 0.5 mV
Pulse Gen Serial Number: 9879509

## 2024-01-22 ENCOUNTER — Ambulatory Visit: Payer: Self-pay | Admitting: Cardiology

## 2024-01-23 NOTE — Progress Notes (Signed)
 Remote ICD Transmission

## 2024-01-29 ENCOUNTER — Ambulatory Visit: Admitting: Podiatry

## 2024-02-10 ENCOUNTER — Ambulatory Visit

## 2024-02-10 ENCOUNTER — Ambulatory Visit: Admitting: Podiatry

## 2024-02-10 ENCOUNTER — Ambulatory Visit (INDEPENDENT_AMBULATORY_CARE_PROVIDER_SITE_OTHER)

## 2024-02-10 ENCOUNTER — Encounter: Payer: Self-pay | Admitting: Podiatry

## 2024-02-10 DIAGNOSIS — M79674 Pain in right toe(s): Secondary | ICD-10-CM | POA: Diagnosis not present

## 2024-02-10 DIAGNOSIS — B351 Tinea unguium: Secondary | ICD-10-CM | POA: Diagnosis not present

## 2024-02-10 DIAGNOSIS — S8265XD Nondisplaced fracture of lateral malleolus of left fibula, subsequent encounter for closed fracture with routine healing: Secondary | ICD-10-CM | POA: Diagnosis not present

## 2024-02-10 DIAGNOSIS — M79675 Pain in left toe(s): Secondary | ICD-10-CM | POA: Diagnosis not present

## 2024-02-10 NOTE — Addendum Note (Signed)
 Addended by: JANIT THRESA HERO on: 02/10/2024 02:44 PM   Modules accepted: Level of Service
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
## 2024-02-11 NOTE — Progress Notes (Signed)
 No ICM remote transmission received for 02/10/2024 and next ICM transmission scheduled for 03/16/2024.

## 2024-02-22 ENCOUNTER — Emergency Department (HOSPITAL_BASED_OUTPATIENT_CLINIC_OR_DEPARTMENT_OTHER)

## 2024-02-22 ENCOUNTER — Other Ambulatory Visit: Payer: Self-pay

## 2024-02-22 ENCOUNTER — Emergency Department (HOSPITAL_BASED_OUTPATIENT_CLINIC_OR_DEPARTMENT_OTHER)
Admission: EM | Admit: 2024-02-22 | Discharge: 2024-02-22 | Disposition: A | Discharge status: Home / Self Care | Attending: Emergency Medicine | Admitting: Emergency Medicine

## 2024-02-22 DIAGNOSIS — S39012A Strain of muscle, fascia and tendon of lower back, initial encounter: Secondary | ICD-10-CM

## 2024-02-22 DIAGNOSIS — M25572 Pain in left ankle and joints of left foot: Secondary | ICD-10-CM

## 2024-02-22 DIAGNOSIS — S60222A Contusion of left hand, initial encounter: Secondary | ICD-10-CM

## 2024-02-22 MED ORDER — ACETAMINOPHEN 500 MG PO TABS
1000.0000 mg | ORAL_TABLET | Freq: Once | ORAL | Status: AC
  Administered 2024-02-22: 1000 mg via ORAL
  Filled 2024-02-22: qty 2
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
## 2024-02-22 NOTE — ED Notes (Signed)
 Discharge condition stable. No questions regarding discharge instructions and follow up. No prescriptions given, OTC meds discussed.

## 2024-03-16 ENCOUNTER — Ambulatory Visit: Attending: Cardiology

## 2024-03-16 DIAGNOSIS — Z95810 Presence of automatic (implantable) cardiac defibrillator: Secondary | ICD-10-CM | POA: Diagnosis not present

## 2024-03-16 DIAGNOSIS — I5022 Chronic systolic (congestive) heart failure: Secondary | ICD-10-CM | POA: Diagnosis not present

## 2024-03-18 ENCOUNTER — Telehealth: Payer: Self-pay

## 2024-03-18 NOTE — Progress Notes (Signed)
 Message sent to device clinic triage regarding SVT and NVST.   Received: Today Gershon Alan BROCKS, RN  Winola Drum, Mitzie RAMAN, RN Thank you for the flag, I will call patient and follow up:)

## 2024-03-18 NOTE — Progress Notes (Signed)
 EPIC Encounter for ICM Monitoring  Patient Name: Silvia Hightower is a 69 y.o. female Date: 03/18/2024 Primary Care Physican: Charlette Erla LABOR, MD Primary Cardiologist: Licia Electrophysiologist: Inocencio Bi-V Pacing: 98%         06/05/2023 Office Weight: 229 lbs 10/29/2023 Weight: Not weighing at home (no scale)   AT/AF Burden:  <1% (takes Warfarin)  SVT Episodes: 10 NSVT: 24 Episodes                                                                 Transmission results reviewed.           Diet: Typically does not follow low salt diet and eats a lot of ice resulting in drinking > 64 oz daily   Since 02/06/2024 ICM Remote Transmission: [place] Thoracic impedance suggesting intermittent days with possible fluid accumulation.    Prescribed: Furosemide  40 mg Take 2 tablets (80 mg total) by mouth daily   Potassium 20 mEq take 1 tablet daily.   Labs: 04/18/2023 Creatinine 0.76, BUN 16, Potassium 4.0, Sodium 137, GFR >60 A complete set of results can be found in Results Review..   Recommendations:   No changes   Follow-up plan: ICM clinic phone appointment on 04/21/2024.   91 day device clinic remote transmission 04/20/2024.     EP/Cardiology Office Visits:   Recall 05/30/2024 with Jodie Passey, PA or Dr Cindie.   Copy of ICM check sent to Dr. Inocencio.     Remote monitoring is medically necessary for Heart Failure Management.    Daily Thoracic Impedance ICM trend: 12/17/2023 through 03/16/2024.    12-14 Month Thoracic Impedance ICM trend:     Mitzie GORMAN Garner, RN 03/18/2024 1:14 PM
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
## 2024-03-24 NOTE — Telephone Encounter (Signed)
 Spoke w/ patient daughter who states patient passed away on 04/11/2023. States she was previously contacted by device clinic asking to speak w/ patient. Patient daughter states after receiving call from device clinic she went to check on her mother and found her passed away. Condolences given to daughter and family.   Will update PA and send message to [facility] HIM. Future home remotes cancelled at this time.

## 2024-04-12 NOTE — Telephone Encounter (Signed)
 Attempted outreach to patient's home and mobile phone numbers  No answer both times  Unable to leave voicemail on either number as the VM boxes were both full  Called and spoke with patient's daughter Javan) and asked her to try and contact her mother and let her know we have been trying to reach her regarding her defibrillator and request that she call us  back (device clinic number provided)  Misha verbalized understanding of this RN's request and stated she will, do what she can.

## 2024-04-12 DEATH — deceased

## 2024-04-21 ENCOUNTER — Ambulatory Visit
